# Patient Record
Sex: Female | Born: 1985
Health system: Southern US, Community
[De-identification: ages and names within clinical notes are randomized; demographics above are authoritative.]

## PROBLEM LIST (undated history)

## (undated) ENCOUNTER — Inpatient Hospital Stay (HOSPITAL_COMMUNITY): Payer: Self-pay

## (undated) DIAGNOSIS — M3500 Sicca syndrome, unspecified: Secondary | ICD-10-CM

## (undated) DIAGNOSIS — R112 Nausea with vomiting, unspecified: Secondary | ICD-10-CM

## (undated) DIAGNOSIS — M2550 Pain in unspecified joint: Secondary | ICD-10-CM

## (undated) DIAGNOSIS — G43909 Migraine, unspecified, not intractable, without status migrainosus: Secondary | ICD-10-CM

## (undated) DIAGNOSIS — F411 Generalized anxiety disorder: Secondary | ICD-10-CM

## (undated) DIAGNOSIS — F419 Anxiety disorder, unspecified: Secondary | ICD-10-CM

## (undated) DIAGNOSIS — Z8489 Family history of other specified conditions: Secondary | ICD-10-CM

## (undated) DIAGNOSIS — Z9889 Other specified postprocedural states: Secondary | ICD-10-CM

## (undated) DIAGNOSIS — F909 Attention-deficit hyperactivity disorder, unspecified type: Secondary | ICD-10-CM

## (undated) DIAGNOSIS — T41205D Adverse effect of unspecified general anesthetics, subsequent encounter: Secondary | ICD-10-CM

## (undated) DIAGNOSIS — K219 Gastro-esophageal reflux disease without esophagitis: Secondary | ICD-10-CM

## (undated) HISTORY — DX: Anxiety disorder, unspecified: F41.9

## (undated) HISTORY — DX: Migraine, unspecified, not intractable, without status migrainosus: G43.909

## (undated) HISTORY — DX: Attention-deficit hyperactivity disorder, unspecified type: F90.9

## (undated) HISTORY — DX: Sjogren syndrome, unspecified: M35.00

---
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
## 2005-09-13 ENCOUNTER — Emergency Department (HOSPITAL_COMMUNITY): Admission: EM | Admit: 2005-09-13 | Discharge: 2005-09-13 | Payer: Self-pay | Admitting: Emergency Medicine

## 2005-09-30 ENCOUNTER — Emergency Department (HOSPITAL_COMMUNITY): Admission: EM | Admit: 2005-09-30 | Discharge: 2005-09-30 | Payer: Self-pay | Admitting: Emergency Medicine

## 2006-02-04 HISTORY — PX: WISDOM TOOTH EXTRACTION: SHX21

## 2011-01-31 ENCOUNTER — Ambulatory Visit (INDEPENDENT_AMBULATORY_CARE_PROVIDER_SITE_OTHER): Payer: Commercial Managed Care - PPO

## 2011-01-31 DIAGNOSIS — J019 Acute sinusitis, unspecified: Secondary | ICD-10-CM

## 2011-01-31 DIAGNOSIS — R12 Heartburn: Secondary | ICD-10-CM

## 2011-02-05 HISTORY — PX: BREAST ENHANCEMENT SURGERY: SHX7

## 2011-02-07 ENCOUNTER — Ambulatory Visit (INDEPENDENT_AMBULATORY_CARE_PROVIDER_SITE_OTHER): Payer: Commercial Managed Care - PPO

## 2011-02-07 DIAGNOSIS — F411 Generalized anxiety disorder: Secondary | ICD-10-CM

## 2011-02-07 DIAGNOSIS — F909 Attention-deficit hyperactivity disorder, unspecified type: Secondary | ICD-10-CM

## 2011-02-07 DIAGNOSIS — S9030XA Contusion of unspecified foot, initial encounter: Secondary | ICD-10-CM

## 2011-02-07 DIAGNOSIS — M79609 Pain in unspecified limb: Secondary | ICD-10-CM

## 2011-03-11 ENCOUNTER — Ambulatory Visit (INDEPENDENT_AMBULATORY_CARE_PROVIDER_SITE_OTHER): Payer: 59 | Admitting: Physician Assistant

## 2011-03-11 VITALS — BP 112/66 | HR 84 | Temp 98.3°F | Resp 16

## 2011-03-11 DIAGNOSIS — M3500 Sicca syndrome, unspecified: Secondary | ICD-10-CM

## 2011-03-11 DIAGNOSIS — R4589 Other symptoms and signs involving emotional state: Secondary | ICD-10-CM

## 2011-03-11 DIAGNOSIS — R112 Nausea with vomiting, unspecified: Secondary | ICD-10-CM

## 2011-03-11 DIAGNOSIS — G43909 Migraine, unspecified, not intractable, without status migrainosus: Secondary | ICD-10-CM

## 2011-03-11 DIAGNOSIS — F419 Anxiety disorder, unspecified: Secondary | ICD-10-CM

## 2011-03-11 DIAGNOSIS — G44209 Tension-type headache, unspecified, not intractable: Secondary | ICD-10-CM

## 2011-03-11 DIAGNOSIS — Z566 Other physical and mental strain related to work: Secondary | ICD-10-CM

## 2011-03-11 MED ORDER — ALPRAZOLAM 0.25 MG PO TABS
0.2500 mg | ORAL_TABLET | Freq: Once | ORAL | Status: AC
  Administered 2011-03-11: 0.25 mg via ORAL

## 2011-03-11 MED ORDER — ONDANSETRON 4 MG PO TBDP
8.0000 mg | ORAL_TABLET | Freq: Once | ORAL | Status: AC
  Administered 2011-03-11: 8 mg via ORAL

## 2011-03-11 MED ORDER — KETOROLAC TROMETHAMINE 60 MG/2ML IM SOLN
60.0000 mg | Freq: Once | INTRAMUSCULAR | Status: AC
  Administered 2011-03-11: 60 mg via INTRAMUSCULAR

## 2011-03-11 NOTE — Progress Notes (Signed)
Subjective:    Patient ID: Teresa Holden, female    DOB: 12-28-1985, 26 y.o.   MRN: [id number]  Primary Physician: No primary provider on file.  Chief Complaint: Headache for 6 days, increased stress at work, and nausea/vomting x 1.  HPI 26 y.o. y/o Caucasian female with history noted below presents with complaint of on and off headache for 6 days now, increased stress, and nausea/emesis. 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 Headache decreases sometimes while at home, then returns upon arrival to work. Headache located along occipital region and down into the neck. Has tried Motrin with some help although headache returns when patient shows up to work. No vision changes, weakness, or focal deficits.   She also reports nausea and emesis x 1 this morning. Still with mild nausea. No further emesis. Has been able to keep drink down. LMP 03/09/11. Using NuvaRing for birth control.  No further right foot pain. Back to wearing good supportive tennis shoes.    Past Medical History  Diagnosis Date  . Sjogren's syndrome   . Migraines   . Anxiety   . ADD (attention deficit disorder with hyperactivity)     Prior to Admission medications   Medication Sig Start Date End Date Taking? Authorizing Provider  ALPRAZolam Prudy Feeler) 0.5 MG tablet Take 0.5 mg by mouth as needed.   Yes Historical Provider, MD  amphetamine-dextroamphetamine (ADDERALL) 30 MG tablet Take 30 mg by mouth daily.   Yes Historical Provider, MD  etonogestrel-ethinyl estradiol (NUVARING) 0.12-0.015 MG/24HR vaginal ring Place 1 each vaginally every 28 (twenty-eight) days. Insert vaginally and leave in place for 3 consecutive weeks, then remove for 1 week.   Yes Historical Provider, MD    Allergies  Allergen Reactions  . Penicillins Rash    History   Social History  . Marital Status: Single    Spouse Name: N/A    Number of Children: N/A  . Years of Education:  N/A   Social History Main Topics  . Smoking status: Never Smoker   . Smokeless tobacco: None  . Alcohol Use: None  . Drug Use: None  . Sexually Active: None   Other Topics Concern  . None   Social History Narrative  . None    Family History  Problem Relation Age of Onset  . Sarcoidosis Mother         Review of Systems  Constitutional: Positive for fatigue. Negative for fever, chills, diaphoresis, activity change and appetite change.  HENT: Negative for hearing loss, congestion, rhinorrhea, neck pain, neck stiffness and tinnitus.   Gastrointestinal: Positive for nausea and vomiting.  Neurological: Positive for headaches. Negative for dizziness, tremors, seizures, syncope, facial asymmetry, speech difficulty, weakness, light-headedness and numbness.       Objective:   Physical Exam  Constitutional: She is oriented to person, place, and time. She appears well-developed and well-nourished. No distress.  HENT:  Head: Normocephalic and atraumatic.  Right Ear: Hearing, tympanic membrane, external ear and ear canal normal.  Left Ear: Hearing, tympanic membrane, external ear and ear canal normal.  Nose: Nose normal.  Mouth/Throat: Oropharynx is clear and moist. No oropharyngeal exudate.  Eyes: Conjunctivae and EOM are normal. Pupils are equal, round, and reactive to light. Right eye exhibits no discharge. Left eye exhibits no discharge. No scleral icterus.  Neck: Normal range of motion. Neck supple.  Cardiovascular: Normal rate, regular rhythm and normal heart sounds.  Exam reveals no gallop and no  friction rub.   No murmur heard. Pulmonary/Chest: Effort normal and breath sounds normal. No respiratory distress. She has no wheezes. She has no rales. She exhibits no tenderness.  Neurological: She is alert and oriented to person, place, and time. She has normal reflexes. She displays no tremor and normal reflexes. No cranial nerve deficit or sensory deficit. She exhibits normal  muscle tone. Coordination normal.  Skin: Skin is warm and dry. No rash noted. She is not diaphoretic.  Psychiatric: She has a normal mood and affect. Her behavior is normal. Judgment and thought content normal.          Assessment & Plan:  26 y.o. Caucasian female with tension type headache, acute stress reaction, nausea, and resolved foot pain. 1. Tension Headache - Declines CT head today. Advised if persists to call.  - Toradol 60 mg IM  2. Acute stress reaction - Xanax 0.25 mg in office - Has Xanax 0.5 mg at home if needed - Take time for herself - If worsens please call  3. Nausea - Zofran 4 mg ODT x 2 given in office - Emesis resolved - Push fluids - Advance diet as tolerated  4. Right foot pain - Resolved  5. RTC/ER precautions  Signed,  Eula Listen, PA-C 03/11/2011, 11:40 AM

## 2011-03-11 NOTE — Patient Instructions (Addendum)

## 2011-03-13 ENCOUNTER — Telehealth: Payer: Self-pay

## 2011-03-13 DIAGNOSIS — R519 Headache, unspecified: Secondary | ICD-10-CM

## 2011-03-13 NOTE — Telephone Encounter (Signed)
Mssg for Teresa Holden's: Laurieanne saw you for a migraine on 03/11/11 and it was discussed that if it continued we would schedule her for a CT scan.  She would like for [place] to go ahead and schedule the CT scan. She is currently having another migraine.

## 2011-03-14 ENCOUNTER — Other Ambulatory Visit: Payer: Self-pay | Admitting: Physician Assistant

## 2011-03-14 ENCOUNTER — Ambulatory Visit (HOSPITAL_BASED_OUTPATIENT_CLINIC_OR_DEPARTMENT_OTHER)
Admission: RE | Admit: 2011-03-14 | Discharge: 2011-03-14 | Disposition: A | Discharge status: Home / Self Care | Payer: 59 | Source: Ambulatory Visit | Attending: Physician Assistant | Admitting: Physician Assistant

## 2011-03-14 DIAGNOSIS — R51 Headache: Secondary | ICD-10-CM | POA: Insufficient documentation

## 2011-03-14 DIAGNOSIS — R519 Headache, unspecified: Secondary | ICD-10-CM

## 2011-03-14 DIAGNOSIS — G43919 Migraine, unspecified, intractable, without status migrainosus: Secondary | ICD-10-CM

## 2011-03-14 MED ORDER — TOPIRAMATE 25 MG PO TABS: ORAL_TABLET | ORAL | Status: DC

## 2011-03-14 NOTE — Telephone Encounter (Signed)
Patient was seen on 2/4 for migraine. At that time she declined a CT scan. I was sent a message this AM that she would like to proceed with the CT scan. Will set this up for this AM with a call report.  Eula Listen, PA-C 03/14/2011 8:11 AM

## 2011-03-14 NOTE — Progress Notes (Signed)
See patient phone call. Patient has had intractable headache for the past 8-9 days. Requested CT scan of her head today. Report of CT normal, without acute intracranial process. Will proceed with starting Topamax, which patient has previously taken with good results. See Rx for titration specifics. Advised patient if headache persists to notify me and we will [facility].  Currently patient states she does have a mild headache.  Discussed with Dr. Cleta Alberts.  Eula Listen, PA-C 03/14/2011 3:44 PM

## 2011-04-03 ENCOUNTER — Telehealth: Payer: Self-pay

## 2011-04-03 NOTE — Telephone Encounter (Signed)
Pt requests refill on adderall and xanax

## 2011-04-04 NOTE — Telephone Encounter (Signed)
Please put her chart in my box.  Thanks,  [employer]

## 2011-04-04 NOTE — Telephone Encounter (Signed)
Pulled chart. In your box

## 2011-04-05 MED ORDER — AMPHETAMINE-DEXTROAMPHETAMINE 30 MG PO TABS: 30.0000 mg | ORAL_TABLET | Freq: Every day | ORAL | Status: DC

## 2011-04-05 MED ORDER — ALPRAZOLAM 0.5 MG PO TABS: 0.5000 mg | ORAL_TABLET | Freq: Three times a day (TID) | ORAL | Status: DC | PRN

## 2011-04-05 NOTE — Telephone Encounter (Signed)
LMOM on VM at 104 letting her know Rx is ready

## 2011-04-05 NOTE — Telephone Encounter (Signed)
Please contact patient. Rx's ready.  Teresa Holden

## 2011-04-12 ENCOUNTER — Ambulatory Visit (INDEPENDENT_AMBULATORY_CARE_PROVIDER_SITE_OTHER): Payer: 59 | Admitting: Physician Assistant

## 2011-04-12 VITALS — BP 128/86 | HR 96 | Temp 98.1°F | Resp 18 | Ht 63.0 in | Wt 137.0 lb

## 2011-04-12 DIAGNOSIS — J40 Bronchitis, not specified as acute or chronic: Secondary | ICD-10-CM

## 2011-04-12 DIAGNOSIS — R05 Cough: Secondary | ICD-10-CM

## 2011-04-12 DIAGNOSIS — R059 Cough, unspecified: Secondary | ICD-10-CM

## 2011-04-12 DIAGNOSIS — J9801 Acute bronchospasm: Secondary | ICD-10-CM

## 2011-04-12 MED ORDER — AZITHROMYCIN 250 MG PO TABS: ORAL_TABLET | ORAL | Status: AC

## 2011-04-12 MED ORDER — ALBUTEROL SULFATE HFA 108 (90 BASE) MCG/ACT IN AERS: 2.0000 | INHALATION_SPRAY | RESPIRATORY_TRACT | Status: DC | PRN

## 2011-04-12 MED ORDER — ALBUTEROL SULFATE (2.5 MG/3ML) 0.083% IN NEBU
2.5000 mg | INHALATION_SOLUTION | Freq: Once | RESPIRATORY_TRACT | Status: AC
  Administered 2011-04-12: 2.5 mg via RESPIRATORY_TRACT

## 2011-04-12 MED ORDER — HYDROCODONE-HOMATROPINE 5-1.5 MG/5ML PO SYRP: ORAL_SOLUTION | ORAL | Status: AC

## 2011-04-12 MED ORDER — BENZONATATE 200 MG PO CAPS: 200.0000 mg | ORAL_CAPSULE | Freq: Three times a day (TID) | ORAL | Status: AC | PRN

## 2011-04-12 NOTE — Progress Notes (Signed)
Patient ID: Teresa Holden MRN: [id number], DOB: 1985-11-27, 26 y.o. Date of Encounter: 04/12/2011, 2:03 PM  Primary Physician: No primary provider on file.  Chief Complaint:  Chief Complaint  Patient presents with  . Cough    greenish x 3 wks  . Sore Throat    x3 wks    HPI: 26 y.o. year old female presents with 21 day history of nasal congestion, post nasal drip, sore throat, sinus pressure, and cough. Afebrile. No chills. Nasal congestion thick and green/yellow. Cough is productive of green/yellow sputum and not associated with time of day. Mild wheezing at night. No SOB. Ears feel full, leading to sensation of muffled hearing. Has tried OTC cold preps without success. No GI complaints. Appetite normal. Sick contacts at work. No recent antibiotics or recent travels.   No leg trauma, sedentary periods, h/o cancer, or tobacco use.  Past Medical History  Diagnosis Date  . Sjogren's syndrome   . Migraines   . Anxiety   . ADD (attention deficit disorder with hyperactivity)      Home Meds: Prior to Admission medications   Medication Sig Start Date End Date Taking? Authorizing Provider  ALPRAZolam Prudy Feeler) 0.5 MG tablet Take 1 tablet (0.5 mg total) by mouth 3 (three) times daily as needed. 04/05/11  Yes Ryker Pherigo M Cortland Crehan, PA-C  amphetamine-dextroamphetamine (ADDERALL) 30 MG tablet Take 1 tablet (30 mg total) by mouth daily. 04/05/11  Yes Leighann Amadon Adria Devon, PA-C  etonogestrel-ethinyl estradiol (NUVARING) 0.12-0.015 MG/24HR vaginal ring Place 1 each vaginally every 28 (twenty-eight) days. Insert vaginally and leave in place for 3 consecutive weeks, then remove for 1 week.   Yes Historical Provider, MD  topiramate (TOPAMAX) 25 MG tablet 1 tab po qhs for one week, then increase to 2 tabs po qhs for one week, then 3 tabs po qhs for one week, then 4 tabs po qhs for one week. 03/14/11  Yes Veryl Winemiller M Guage Efferson, PA-C  albuterol (PROVENTIL HFA;VENTOLIN HFA) 108 (90 BASE) MCG/ACT inhaler Inhale 2 puffs into the lungs  every 4 (four) hours as needed for wheezing. 04/12/11 04/11/12  Raymon Mutton Brizza Nathanson, PA-C  azithromycin (ZITHROMAX Z-PAK) 250 MG tablet 2 tabs po first day, then 1 tab po next 4 days 04/12/11 04/17/11  Raymon Mutton Nelda Luckey, PA-C  benzonatate (TESSALON) 200 MG capsule Take 1 capsule (200 mg total) by mouth 3 (three) times daily as needed for cough. 04/12/11 04/19/11  Zyrus Hetland M Keishia Ground, PA-C  HYDROcodone-homatropine (HYCODAN) 5-1.5 MG/5ML syrup 1 TSP PO Q 4-6 HOURS PRN COUGH 04/12/11 04/22/11  Sondra Barges, PA-C    Allergies:  Allergies  Allergen Reactions  . Penicillins Rash    History   Social History  . Marital Status: Married    Spouse Name: N/A    Number of Children: N/A  . Years of Education: N/A   Occupational History  . Not on file.   Social History Main Topics  . Smoking status: Never Smoker   . Smokeless tobacco: Not on file  . Alcohol Use: Not on file  . Drug Use: Not on file  . Sexually Active: Not on file   Other Topics Concern  . Not on file   Social History Narrative  . No narrative on file     Review of Systems: Constitutional: negative for chills, fever, night sweats or weight changes Cardiovascular: negative for chest pain or palpitations Respiratory: negative for hemoptysis, dyspnea, or shortness of breath Abdominal: negative for abdominal pain, nausea, vomiting or diarrhea  Dermatological: negative for rash Neurologic: negative for headache   Physical Exam: Blood pressure 128/86, pulse 96, temperature 98.1 F (36.7 C), temperature source Oral, resp. rate 18, height 5\' 3"  (1.6 m), weight 137 lb (62.143 kg), last menstrual period 04/07/2011., Body mass index is 24.27 kg/(m^2). General: Well developed, well nourished, in no acute distress. Head: Normocephalic, atraumatic, eyes without discharge, sclera non-icteric, nares are congested. Bilateral auditory canals clear, TM's are without perforation, pearly grey with reflective cone of light bilaterally. Serous effusion bilaterally behind  TM's. No sinus TTP. Oral cavity moist, dentition normal. Posterior pharynx with post nasal drip and mild erythema. No peritonsillar abscess or tonsillar exudate. Neck: Supple. No thyromegaly. Full ROM. No lymphadenopathy. Lungs: Clear bilaterally to auscultation with mild expiratory wheezes. No rales or rhonchi. Breathing is unlabored.  Heart: RRR with S1 S2. No murmurs, rubs, or gallops appreciated. Msk:  Strength and tone normal for age. Extremities: No clubbing or cyanosis. No edema. Neuro: Alert and oriented X 3. Moves all extremities spontaneously. CNII-XII grossly in tact. Psych:  Responds to questions appropriately with a normal affect.   S/P: Albuterol Neb, patient feels considerably better. No further wheezing on auscultation.  ASSESSMENT AND PLAN:  26 y.o. year old female with bronchitis and bronchospasm. -Azithromycin 250 MG #6 2 po first day then 1 po next 4 days no RF -Hycodan #4oz 1 tsp po q 4-6 hours prn cough no RF SED -Tessalon Perles 100 mg 1 po tid prn cough #30 no RF  -Proventil 2 puffs inhaled q 4-6 hours prn #1 no RF -Mucinex -Tylenol/Motrin prn -Rest/fluids -RTC precautions -RTC 3-5 days if no improvement  Signed, Eula Listen, PA-C 04/12/2011 2:03 PM

## 2011-05-13 ENCOUNTER — Telehealth: Payer: Self-pay

## 2011-05-13 MED ORDER — ALPRAZOLAM 0.5 MG PO TABS: 0.5000 mg | ORAL_TABLET | Freq: Three times a day (TID) | ORAL | Status: DC | PRN

## 2011-05-13 MED ORDER — AMPHETAMINE-DEXTROAMPHETAMINE 30 MG PO TABS: 30.0000 mg | ORAL_TABLET | Freq: Every day | ORAL | Status: DC

## 2011-05-13 NOTE — Telephone Encounter (Signed)
At Tl desk 

## 2011-05-13 NOTE — Telephone Encounter (Signed)
Patient needs refills of Adderall 30 mg and Xanax .5 mg.  Please call when ready to pick up.

## 2011-05-13 NOTE — Telephone Encounter (Signed)
Notified pt that Rxs ready for p/up

## 2011-05-13 NOTE — Telephone Encounter (Signed)
Xanax at [employer] [facility].

## 2011-05-13 NOTE — Telephone Encounter (Signed)
Please see request for xanax also

## 2011-06-24 ENCOUNTER — Telehealth: Payer: Self-pay

## 2011-06-24 MED ORDER — AMPHETAMINE-DEXTROAMPHETAMINE 30 MG PO TABS: 30.0000 mg | ORAL_TABLET | Freq: Every day | ORAL | Status: DC

## 2011-06-24 MED ORDER — CLONAZEPAM 0.5 MG PO TABS: 0.5000 mg | ORAL_TABLET | Freq: Two times a day (BID) | ORAL | Status: DC | PRN

## 2011-06-24 NOTE — Telephone Encounter (Signed)
Refill done, change to klonopin made.

## 2011-06-24 NOTE — Telephone Encounter (Signed)
Pt.notified

## 2011-06-24 NOTE — Telephone Encounter (Signed)
Teresa Holden  PT REQUESTS ADDERALL RX REFILL  WOULD LIKE TO SEE IF XANAX CAN BE CHANGED TO KLONOPIN AS DISCUSED IN PREVIOUS OFFICE VISIT. XANAX SEEMS TO NOT BE WORKING AT NIGHT AS WELL AS IT USED TO.   PLEASE ADVISE WHEN READY FOR PICK UP

## 2011-08-04 ENCOUNTER — Ambulatory Visit (INDEPENDENT_AMBULATORY_CARE_PROVIDER_SITE_OTHER): Payer: 59 | Admitting: Emergency Medicine

## 2011-08-04 VITALS — BP 113/77 | HR 91 | Temp 98.2°F | Resp 18 | Ht 63.0 in | Wt 130.2 lb

## 2011-08-04 DIAGNOSIS — J029 Acute pharyngitis, unspecified: Secondary | ICD-10-CM

## 2011-08-04 DIAGNOSIS — J40 Bronchitis, not specified as acute or chronic: Secondary | ICD-10-CM

## 2011-08-04 DIAGNOSIS — F988 Other specified behavioral and emotional disorders with onset usually occurring in childhood and adolescence: Secondary | ICD-10-CM

## 2011-08-04 DIAGNOSIS — J018 Other acute sinusitis: Secondary | ICD-10-CM

## 2011-08-04 MED ORDER — AMPHETAMINE-DEXTROAMPHETAMINE 30 MG PO TABS: 30.0000 mg | ORAL_TABLET | Freq: Every day | ORAL | Status: DC

## 2011-08-04 MED ORDER — PSEUDOEPHEDRINE-GUAIFENESIN ER 60-600 MG PO TB12: 1.0000 | ORAL_TABLET | Freq: Two times a day (BID) | ORAL | Status: DC

## 2011-08-04 MED ORDER — LEVOFLOXACIN 500 MG PO TABS: 500.0000 mg | ORAL_TABLET | Freq: Every day | ORAL | Status: DC

## 2011-08-04 MED ORDER — HYDROCOD POLST-CHLORPHEN POLST 10-8 MG/5ML PO LQCR: 5.0000 mL | Freq: Two times a day (BID) | ORAL | Status: DC | PRN

## 2011-08-04 NOTE — Progress Notes (Signed)
  Subjective:    Patient ID: Teresa Holden, female    DOB: 1985/07/04, 26 y.o.   MRN: [id number]  Cough This is a new problem. The problem occurs every few minutes. The cough is productive of purulent sputum. Associated symptoms include nasal congestion, postnasal drip and a sore throat. Pertinent negatives include no chest pain, chills, ear congestion, ear pain, fever, headaches, heartburn, hemoptysis, myalgias, rash, rhinorrhea, shortness of breath, sweats, weight loss or wheezing. The symptoms are aggravated by lying down. She has tried OTC cough suppressant and a beta-agonist inhaler for the symptoms. The treatment provided no relief. There is no history of asthma, bronchiectasis, bronchitis, COPD, emphysema, environmental allergies or pneumonia.  Sore Throat  This is a new problem. The current episode started in the past 7 days. The problem has been gradually worsening. Neither side of throat is experiencing more pain than the other. The maximum temperature recorded prior to her arrival was 100 - 100.9 F. The pain is at a severity of 3/10. The pain is mild. Associated symptoms include congestion, coughing and a hoarse voice. Pertinent negatives include no abdominal pain, diarrhea, drooling, ear discharge, ear pain, headaches, plugged ear sensation, neck pain, shortness of breath, stridor, swollen glands, trouble swallowing or vomiting. She has tried acetaminophen for the symptoms. The treatment provided mild relief.      Review of Systems  Constitutional: Negative for fever, chills and weight loss.  HENT: Positive for congestion, sore throat, hoarse voice and postnasal drip. Negative for ear pain, rhinorrhea, drooling, trouble swallowing, neck pain and ear discharge.   Eyes: Negative.   Respiratory: Positive for cough. Negative for hemoptysis, shortness of breath, wheezing and stridor.   Cardiovascular: Negative for chest pain.  Gastrointestinal: Negative.  Negative for heartburn, vomiting,  abdominal pain and diarrhea.  Genitourinary: Negative.   Musculoskeletal: Negative.  Negative for myalgias.  Skin: Negative for rash.  Neurological: Negative.  Negative for headaches.  Hematological: Negative for environmental allergies.       Objective:   Physical Exam  Constitutional: She is oriented to person, place, and time. She appears well-developed and well-nourished.  HENT:  Head: Normocephalic and atraumatic.  Nose: Mucosal edema present. Right sinus exhibits maxillary sinus tenderness. Left sinus exhibits maxillary sinus tenderness.  Mouth/Throat: Oropharynx is clear and moist.  Eyes: Pupils are equal, round, and reactive to light.  Neck: Normal range of motion. Neck supple.  Cardiovascular: Normal rate, regular rhythm and normal heart sounds.   Pulmonary/Chest: Effort normal.  Abdominal: Soft.  Musculoskeletal: Normal range of motion.  Neurological: She is alert and oriented to person, place, and time.  Skin: Skin is warm and dry.          Assessment & Plan:  Maxillary sinusitis Bronchitis Pharyngitis levaquin mucinex d tussionex

## 2011-08-12 ENCOUNTER — Telehealth: Payer: Self-pay

## 2011-08-12 DIAGNOSIS — J018 Other acute sinusitis: Secondary | ICD-10-CM

## 2011-08-12 MED ORDER — LEVOFLOXACIN 500 MG PO TABS: 500.0000 mg | ORAL_TABLET | Freq: Every day | ORAL | Status: AC

## 2011-08-12 NOTE — Telephone Encounter (Signed)
Patient would like for [place] to call in another rx for levaquin.  She lost hers on vacation.  Can this be done?  Still having throat issues.

## 2011-08-12 NOTE — Telephone Encounter (Signed)
PT LOST LEVAQUIN RX ON VACATION AND WOULD LIKE ANOTHER ONE CALLED IN. PT USES [facility] [phone]

## 2011-08-12 NOTE — Telephone Encounter (Signed)
How many days did she miss or take? The Levaquin was for sinusitis mainly.  What are her symptoms specifically?

## 2011-08-12 NOTE — Addendum Note (Signed)
Addended by: Jacqualyn Posey on: 08/12/2011 06:19 PM   Modules accepted: Orders

## 2011-08-12 NOTE — Telephone Encounter (Signed)
Spoke with patient-- took 5 days of Levaquin, then lost rx of remaining 5 days.  Still having cough and chest congestion.  Ok per [place] to rx remainder.

## 2011-08-13 NOTE — Telephone Encounter (Signed)
Notified pt Rx was sent to pharmacy.

## 2011-08-28 ENCOUNTER — Other Ambulatory Visit (INDEPENDENT_AMBULATORY_CARE_PROVIDER_SITE_OTHER): Payer: 59 | Admitting: Physician Assistant

## 2011-08-28 ENCOUNTER — Other Ambulatory Visit: Payer: Self-pay | Admitting: Physician Assistant

## 2011-08-28 DIAGNOSIS — Z419 Encounter for procedure for purposes other than remedying health state, unspecified: Secondary | ICD-10-CM

## 2011-08-28 LAB — CBC WITH DIFFERENTIAL/PLATELET
Basophils Absolute: 0 10*3/uL (ref 0.0–0.1)
Basophils Relative: 0 % (ref 0–1)
Eosinophils Absolute: 0.1 10*3/uL (ref 0.0–0.7)
Eosinophils Relative: 2 % (ref 0–5)
HCT: 38.8 % (ref 36.0–46.0)
Hemoglobin: 12.9 g/dL (ref 12.0–15.0)
Lymphocytes Relative: 33 % (ref 12–46)
Lymphs Abs: 1.5 10*3/uL (ref 0.7–4.0)
MCH: 27 pg (ref 26.0–34.0)
MCHC: 33.2 g/dL (ref 30.0–36.0)
MCV: 81.2 fL (ref 78.0–100.0)
Monocytes Absolute: 0.5 10*3/uL (ref 0.1–1.0)
Monocytes Relative: 10 % (ref 3–12)
Neutro Abs: 2.6 10*3/uL (ref 1.7–7.7)
Neutrophils Relative %: 55 % (ref 43–77)
Platelets: 267 10*3/uL (ref 150–400)
RBC: 4.78 MIL/uL (ref 3.87–5.11)
RDW: 14.7 % (ref 11.5–15.5)
WBC: 4.6 10*3/uL (ref 4.0–10.5)

## 2011-08-28 LAB — BASIC METABOLIC PANEL
BUN: 9 mg/dL (ref 6–23)
CO2: 26 mEq/L (ref 19–32)
Calcium: 9.3 mg/dL (ref 8.4–10.5)
Chloride: 105 mEq/L (ref 96–112)
Creat: 0.8 mg/dL (ref 0.50–1.10)
Glucose, Bld: 86 mg/dL (ref 70–99)
Potassium: 4.1 mEq/L (ref 3.5–5.3)
Sodium: 140 mEq/L (ref 135–145)

## 2011-08-28 MED ORDER — ALPRAZOLAM 0.5 MG PO TABS: 0.5000 mg | ORAL_TABLET | Freq: Three times a day (TID) | ORAL | Status: DC | PRN

## 2011-08-28 NOTE — Progress Notes (Signed)
  Subjective:    Patient ID: Teresa Holden, female    DOB: Jun 12, 1985, 26 y.o.   MRN: [id number]  HPI Needs labs for elective surgery procedure.   Review of Systems Not done     Objective:   Physical Exam Not done        Assessment & Plan:  Check BMET and CBC per Plastic Surgeon's request.

## 2011-09-18 ENCOUNTER — Telehealth: Payer: Self-pay

## 2011-09-18 ENCOUNTER — Other Ambulatory Visit: Payer: Self-pay

## 2011-09-18 MED ORDER — FLUCONAZOLE 150 MG PO TABS: 150.0000 mg | ORAL_TABLET | Freq: Once | ORAL | Status: AC

## 2011-09-18 MED ORDER — FLUCONAZOLE 150 MG PO TABS: 150.0000 mg | ORAL_TABLET | Freq: Once | ORAL | Status: DC

## 2011-09-18 NOTE — Telephone Encounter (Signed)
Pt called and states she has a yeast infection due to the antibiotic. Sent in RX to [facility] per Benny Lennert

## 2011-10-16 ENCOUNTER — Ambulatory Visit (INDEPENDENT_AMBULATORY_CARE_PROVIDER_SITE_OTHER): Payer: 59 | Admitting: Physician Assistant

## 2011-10-16 VITALS — BP 104/76 | HR 91 | Temp 98.0°F | Resp 16 | Ht 64.0 in | Wt 131.0 lb

## 2011-10-16 DIAGNOSIS — F411 Generalized anxiety disorder: Secondary | ICD-10-CM

## 2011-10-16 DIAGNOSIS — F418 Other specified anxiety disorders: Secondary | ICD-10-CM

## 2011-10-16 MED ORDER — ALPRAZOLAM 0.5 MG PO TABS: ORAL_TABLET | ORAL | Status: DC

## 2011-10-16 MED ORDER — ESCITALOPRAM OXALATE 10 MG PO TABS: 10.0000 mg | ORAL_TABLET | Freq: Every day | ORAL | Status: DC

## 2011-10-16 NOTE — Progress Notes (Signed)
  Subjective:    Patient ID: Teresa Holden, female    DOB: July 25, 1985, 26 y.o.   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  She had a recent surgery.  Increased responsibility at work.  Dealing with child's father about child-support issues and him not following the terms of their previous agreements. She currently takes 1-2 0.5mg  xanax hs.  She was taking lexapro several years ago and seemed to do well on it. She hasn't needed it since then.  Denies SI/HI.  She has a good support system at home.    Review of Systems  All other systems reviewed and are negative.       Objective:   Physical Exam  Nursing note and vitals reviewed. Constitutional: She is oriented to person, place, and time. She appears well-developed and well-nourished.       Tearful at times  HENT:  Head: Normocephalic and atraumatic.  Cardiovascular: Normal rate, regular rhythm and normal heart sounds.   Pulmonary/Chest: Effort normal and breath sounds normal.  Neurological: She is alert and oriented to person, place, and time.  Skin: Skin is warm.  Psychiatric: She has a normal mood and affect. Her behavior is normal.      Assessment & Plan:  Situational anxiety-restart lexapro. Ok to use xanax 1/2-1 prn up to bid.  She will use this as needed but sparingly as the lexapro takes effect. We will discuss again in a few weeks, sooner if needed.

## 2011-12-03 ENCOUNTER — Ambulatory Visit (INDEPENDENT_AMBULATORY_CARE_PROVIDER_SITE_OTHER): Payer: 59 | Admitting: Physician Assistant

## 2011-12-03 VITALS — BP 102/78 | HR 90 | Temp 98.3°F | Resp 16 | Ht 64.75 in | Wt 136.4 lb

## 2011-12-03 DIAGNOSIS — T148XXA Other injury of unspecified body region, initial encounter: Secondary | ICD-10-CM

## 2011-12-03 DIAGNOSIS — M791 Myalgia, unspecified site: Secondary | ICD-10-CM

## 2011-12-03 DIAGNOSIS — IMO0001 Reserved for inherently not codable concepts without codable children: Secondary | ICD-10-CM

## 2011-12-03 LAB — PROTIME-INR
INR: 0.99 (ref <1.50)
Prothrombin Time: 13.1 seconds (ref 11.6–15.2)

## 2011-12-03 LAB — POCT CBC
Granulocyte percent: 68.3 %G (ref 37–80)
HCT, POC: 42.8 % (ref 37.7–47.9)
Hemoglobin: 13.5 g/dL (ref 12.2–16.2)
Lymph, poc: 2.1 (ref 0.6–3.4)
MCH, POC: 27.1 pg (ref 27–31.2)
MCHC: 31.5 g/dL — AB (ref 31.8–35.4)
MCV: 85.7 fL (ref 80–97)
MID (cbc): 0.5 (ref 0–0.9)
MPV: 9.4 fL (ref 0–99.8)
POC Granulocyte: 5.6 (ref 2–6.9)
POC LYMPH PERCENT: 26.1 %L (ref 10–50)
POC MID %: 5.6 %M (ref 0–12)
Platelet Count, POC: 285 10*3/uL (ref 142–424)
RBC: 4.99 M/uL (ref 4.04–5.48)
RDW, POC: 14.2 %
WBC: 8.2 10*3/uL (ref 4.6–10.2)

## 2011-12-03 LAB — COMPREHENSIVE METABOLIC PANEL
ALT: 13 U/L (ref 0–35)
AST: 16 U/L (ref 0–37)
Albumin: 4.3 g/dL (ref 3.5–5.2)
Alkaline Phosphatase: 48 U/L (ref 39–117)
BUN: 9 mg/dL (ref 6–23)
CO2: 25 mEq/L (ref 19–32)
Calcium: 9.2 mg/dL (ref 8.4–10.5)
Chloride: 101 mEq/L (ref 96–112)
Creat: 0.79 mg/dL (ref 0.50–1.10)
Glucose, Bld: 74 mg/dL (ref 70–99)
Potassium: 4.2 mEq/L (ref 3.5–5.3)
Sodium: 136 mEq/L (ref 135–145)
Total Bilirubin: 0.6 mg/dL (ref 0.3–1.2)
Total Protein: 6.6 g/dL (ref 6.0–8.3)

## 2011-12-03 MED ORDER — PREDNISONE 20 MG PO TABS: ORAL_TABLET | ORAL | Status: DC

## 2011-12-03 MED ORDER — KETOROLAC TROMETHAMINE 60 MG/2ML IM SOLN
60.0000 mg | Freq: Once | INTRAMUSCULAR | Status: AC
  Administered 2011-12-03: 60 mg via INTRAMUSCULAR

## 2011-12-03 NOTE — Progress Notes (Signed)
Patient ID: Teresa Holden MRN: [id number], DOB: November 10, 1985, 26 y.o. Date of Encounter: 12/03/2011, 2:52 PM  Primary Physician: Carola Frost  Chief Complaint: Myalgias and bruising  HPI: 26 y.o. year old female with history below presents with two concerns. 1) Return of her longstanding history of myalgias. She complains of just being sore all over. Missed a day of work on 12/02/11 due to her left leg and hip hurting so much. Today it is improved, but still somewhat sore. She is sore in her usual joints: knees, shoulders, hands/wrists, and now her hips.   2) She also mentions a new history of bruising. This has been going on for the past several weeks. She was recently started on Lexapro in September for anxiety. She denies any trauma or injury. She has small discoid bruising from her flu vaccine, a bruise on each forearm, and a bruise on each inner thigh. She is in a happy marriage. Denies any spousal abuse.    Past Medical History  Diagnosis Date  . Sjogren's syndrome   . Migraines   . Anxiety   . ADD (attention deficit disorder with hyperactivity)      Home Meds: Prior to Admission medications   Medication Sig Start Date End Date Taking? Authorizing Provider  ALPRAZolam Prudy Feeler) 0.5 MG tablet 1/2-1 in am and every 6 hrs while awake.  May take up to 2 hs 10/16/11  Yes Anders Simmonds, PA-C  amphetamine-dextroamphetamine (ADDERALL) 30 MG tablet Take 1 tablet (30 mg total) by mouth daily. Do not fill before 10/04/2011 08/04/11  Yes Phillips Odor, MD  escitalopram (LEXAPRO) 10 MG tablet Take 1 tablet (10 mg total) by mouth daily. 10/16/11 01/14/12 Yes Marzella Schlein McClung, PA-C  albuterol (PROVENTIL HFA;VENTOLIN HFA) 108 (90 BASE) MCG/ACT inhaler Inhale 2 puffs into the lungs every 4 (four) hours as needed for wheezing. 04/12/11 04/11/12  Ryan M Dunn, PA-C  benzonatate (TESSALON) 100 MG capsule Take 100 mg by mouth 3 (three) times daily as needed.    Historical Provider, MD  cetirizine  (ZYRTEC) 10 MG tablet Take 10 mg by mouth daily.    Historical Provider, MD  chlorpheniramine-HYDROcodone (TUSSIONEX PENNKINETIC ER) 10-8 MG/5ML LQCR Take 5 mLs by mouth every 12 (twelve) hours as needed (cough). 08/04/11   Phillips Odor, MD  clonazePAM (KLONOPIN) 0.5 MG tablet Take 1 tablet (0.5 mg total) by mouth 2 (two) times daily as needed for anxiety. 06/24/11 07/24/11  Sondra Barges, PA-C  levonorgestrel (MIRENA) 20 MCG/24HR IUD 1 each by Intrauterine route once.    Historical Provider, MD  pseudoephedrine-guaifenesin (MUCINEX D) 60-600 MG per tablet Take 1 tablet by mouth every 12 (twelve) hours. 08/04/11 08/03/12  Phillips Odor, MD  topiramate (TOPAMAX) 25 MG tablet 1 tab po qhs for one week, then increase to 2 tabs po qhs for one week, then 3 tabs po qhs for one week, then 4 tabs po qhs for one week. 03/14/11   Sondra Barges, PA-C    Allergies:  Allergies  Allergen Reactions  . Penicillins Rash    History   Social History  . Marital Status: Married    Spouse Name: N/A    Number of Children: N/A  . Years of Education: N/A   Occupational History  . Not on file.   Social History Main Topics  . Smoking status: Never Smoker   . Smokeless tobacco: Not on file  . Alcohol Use: Not on file  . Drug Use: Not on file  .  Sexually Active: Not on file   Other Topics Concern  . Not on file   Social History Narrative  . No narrative on file     Review of Systems: Constitutional: negative for chills, fever, night sweats, weight changes, or fatigue  HEENT: negative for vision changes, hearing loss, congestion, rhinorrhea, ST, epistaxis, or sinus pressure Cardiovascular: negative for chest pain or palpitations Respiratory: negative for hemoptysis, wheezing, shortness of breath, or cough Abdominal: negative for abdominal pain, nausea, vomiting, diarrhea, or constipation Dermatological: negative for rash Musculoskeletal: see above  Neurologic: negative for headache, dizziness, or  syncope All other systems reviewed and are otherwise negative with the exception to those above and in the HPI.   Physical Exam: Blood pressure 102/78, pulse 90, temperature 98.3 F (36.8 C), temperature source Oral, resp. rate 16, height 5' 4.75" (1.645 m), weight 136 lb 6.4 oz (61.871 kg), last menstrual period 12/03/2010, SpO2 100.00%., Body mass index is 22.87 kg/(m^2). General: Well developed, well nourished, in no acute distress. Head: Normocephalic, atraumatic, eyes without discharge, sclera non-icteric, nares are without discharge. Bilateral auditory canals clear, TM's are without perforation, pearly grey and translucent with reflective cone of light bilaterally. Oral cavity moist, posterior pharynx without exudate, erythema, peritonsillar abscess, or post nasal drip.  Neck: Supple. No thyromegaly. Full ROM. No lymphadenopathy. Lungs: Clear bilaterally to auscultation without wheezes, rales, or rhonchi. Breathing is unlabored. Heart: RRR with S1 S2. No murmurs, rubs, or gallops appreciated. Msk:  Strength and tone normal for age. Extremities/Skin: Warm and dry. No clubbing or cyanosis. No edema. No rashes or suspicious lesions. FROM of all extremities. 5/5 strength in all extremities. No bony TTP. She does have several small discoid bruises at right bicep, right and left forearm, and she reports having two on the inner thigh. These are inconsistent with trauma or grasp.   Neuro: Alert and oriented X 3. Moves all extremities spontaneously. Gait is normal. CNII-XII grossly in tact. DTR 2+ in lower extremities.  Psych:  Responds to questions appropriately with a normal affect.   Labs: Results for orders placed in visit on 12/03/11  POCT CBC      Component Value Range   WBC 8.2  4.6 - 10.2 K/uL   Lymph, poc 2.1  0.6 - 3.4   POC LYMPH PERCENT 26.1  10 - 50 %L   MID (cbc) 0.5  0 - 0.9   POC MID % 5.6  0 - 12 %M   POC Granulocyte 5.6  2 - 6.9   Granulocyte percent 68.3  37 - 80 %G   RBC  4.99  4.04 - 5.48 M/uL   Hemoglobin 13.5  12.2 - 16.2 g/dL   HCT, POC 16.1  09.6 - 47.9 %   MCV 85.7  80 - 97 fL   MCH, POC 27.1  27 - 31.2 pg   MCHC 31.5 (*) 31.8 - 35.4 g/dL   RDW, POC 04.5     Platelet Count, POC 285  142 - 424 K/uL   MPV 9.4  0 - 99.8 fL   CMP and PT/INR pending.  ASSESSMENT AND PLAN:  26 y.o. year old female with longstanding history of myalgias and bruising 1. Myalgias -Longstanding history -Toradol 60 mg IM -Prednisone 20 mg 3x3, 2x3, 1x3 #18 no RF  2. Bruising -Denies any spousal abuse, patient is alone in the room, I feel that she is being 100% honest with me -No known injury or trauma -Will check a CMP and PT/INR -This is  almost certainly from her Lexapro, however she is tolerating it quite well -Her platelets are normal so will continue treatment at current time -Discussed with Dr. Cleta Alberts   Signed, Eula Listen, PA-C 12/03/2011 2:52 PM

## 2011-12-09 ENCOUNTER — Ambulatory Visit (INDEPENDENT_AMBULATORY_CARE_PROVIDER_SITE_OTHER): Payer: 59 | Admitting: Physician Assistant

## 2011-12-09 ENCOUNTER — Encounter: Payer: Self-pay | Admitting: Physician Assistant

## 2011-12-09 VITALS — BP 117/75 | HR 86 | Temp 98.7°F | Resp 16 | Ht 64.25 in | Wt 138.2 lb

## 2011-12-09 DIAGNOSIS — M25559 Pain in unspecified hip: Secondary | ICD-10-CM

## 2011-12-09 DIAGNOSIS — M62838 Other muscle spasm: Secondary | ICD-10-CM

## 2011-12-09 MED ORDER — KETOROLAC TROMETHAMINE 60 MG/2ML IM SOLN
60.0000 mg | Freq: Once | INTRAMUSCULAR | Status: AC
  Administered 2011-12-09: 60 mg via INTRAMUSCULAR

## 2011-12-09 MED ORDER — METHOCARBAMOL 750 MG PO TABS: 750.0000 mg | ORAL_TABLET | Freq: Four times a day (QID) | ORAL | Status: DC

## 2011-12-09 NOTE — Progress Notes (Signed)
Patient ID: Teresa Holden MRN: [id number], DOB: 12-27-1985, 26 y.o. Date of Encounter: 12/09/2011, 4:36 PM  Primary Physician: Carola Frost  Chief Complaint: Left hip pain  HPI: 26 y.o. year old female with history below presents with continued left hip pain. See office visit from 12/03/11. Patient initially mentioned left leg/hip pain that began on 12/02/11 that caused her to miss work. She was treated with Toradol 60 mg IM on 10/29 that seemed to offer her good relief from her pain and given a prednisone taper that did not seem to help. She comes in today with continued pain along the posterior left hip. No injury or trauma to this area. Pain is sharp. Pain will sometimes radiate distally down to her left knee. Never with any weakness or paraesthesias. Has been taking ibuprofen without much success. Has been using a heating pad with good success. When she tries to stand from a sitting position the posterior left hip feels tight. No loss of bowel or bladder function.     Past Medical History  Diagnosis Date  . Sjogren's syndrome   . Migraines   . Anxiety   . ADD (attention deficit disorder with hyperactivity)      Home Meds: Prior to Admission medications   Medication Sig Start Date End Date Taking? Authorizing Provider  ALPRAZolam Prudy Feeler) 0.5 MG tablet 1/2-1 in am and every 6 hrs while awake.  May take up to 2 hs 10/16/11  Yes Anders Simmonds, PA-C  amphetamine-dextroamphetamine (ADDERALL) 30 MG tablet Take 1 tablet (30 mg total) by mouth daily. Do not fill before 10/04/2011 08/04/11  Yes Phillips Odor, MD  escitalopram (LEXAPRO) 10 MG tablet Take 1 tablet (10 mg total) by mouth daily. 10/16/11 01/14/12 Yes Anders Simmonds, PA-C  levonorgestrel (MIRENA) 20 MCG/24HR IUD 1 each by Intrauterine route once.   Yes Historical Provider, MD  albuterol (PROVENTIL HFA;VENTOLIN HFA) 108 (90 BASE) MCG/ACT inhaler Inhale 2 puffs into the lungs every 4 (four) hours as needed for wheezing.  04/12/11 04/11/12  Ryan M Dunn, PA-C  benzonatate (TESSALON) 100 MG capsule Take 100 mg by mouth 3 (three) times daily as needed.    Historical Provider, MD  cetirizine (ZYRTEC) 10 MG tablet Take 10 mg by mouth daily.    Historical Provider, MD  chlorpheniramine-HYDROcodone (TUSSIONEX PENNKINETIC ER) 10-8 MG/5ML LQCR Take 5 mLs by mouth every 12 (twelve) hours as needed (cough). 08/04/11   Phillips Odor, MD  clonazePAM (KLONOPIN) 0.5 MG tablet Take 1 tablet (0.5 mg total) by mouth 2 (two) times daily as needed for anxiety. 06/24/11 07/24/11  Ryan M Dunn, PA-C  predniSONE (DELTASONE) 20 MG tablet 3 PO FOR 3 DAYS, 2 PO FOR 3 DAYS, 1 PO FOR 3 DAYS 12/03/11  No Ryan M Dunn, PA-C  pseudoephedrine-guaifenesin [facility] D) 60-600 MG per tablet Take 1 tablet by mouth every 12 (twelve) hours. 08/04/11 08/03/12  Phillips Odor, MD  topiramate (TOPAMAX) 25 MG tablet 1 tab po qhs for one week, then increase to 2 tabs po qhs for one week, then 3 tabs po qhs for one week, then 4 tabs po qhs for one week. 03/14/11   Sondra Barges, PA-C    Allergies:  Allergies  Allergen Reactions  . Penicillins Rash    History   Social History  . Marital Status: Married    Spouse Name: N/A    Number of Children: N/A  . Years of Education: N/A   Occupational History  . Not on file.  Social History Main Topics  . Smoking status: Never Smoker   . Smokeless tobacco: Not on file  . Alcohol Use: Not on file  . Drug Use: Not on file  . Sexually Active: Not on file   Other Topics Concern  . Not on file   Social History Narrative  . No narrative on file     Review of Systems: Constitutional: negative for chills, fever, night sweats, weight changes, or fatigue  Musculoskeletal: see above Dermatological: negative for rash Neurologic: negative for headache, dizziness, or syncope    Physical Exam: Blood pressure 117/75, pulse 86, temperature 98.7 F (37.1 C), temperature source Oral, resp. rate 16, height 5' 4.25"  (1.632 m), weight 138 lb 3.2 oz (62.687 kg), last menstrual period 12/03/2010, SpO2 100.00%., Body mass index is 23.54 kg/(m^2). General: Well developed, well nourished, in no acute distress. Head: Normocephalic, atraumatic, eyes without discharge, sclera non-icteric, nares are without discharge.  Neck: Supple. Lungs: Breathing is unlabored. Heart: Regular rate. Msk:  Strength and tone normal for age. No bony TTP of hip or left leg. FROM of left leg without pain. 5/5 strength. No midline pain.  Extremities/Skin: Warm and dry. No clubbing or cyanosis. No edema. No rashes or suspicious lesions. Neuro: Alert and oriented X 3. Moves all extremities spontaneously. Gait is normal. CNII-XII grossly in tact. Psych:  Responds to questions appropriately with a normal affect.     ASSESSMENT AND PLAN:  26 y.o. year old female with left leg/hip pain secondary to muscle spasm. -Trial of Robaxin 750 mg 1 po qid #40 RF 2 -Toradol 60 mg IM in office -If pain persists will need plain films -Patient to call with update in 48 hours    Signed, Eula Listen, PA-C 12/09/2011 4:36 PM

## 2012-01-08 ENCOUNTER — Telehealth: Payer: Self-pay

## 2012-01-08 DIAGNOSIS — F988 Other specified behavioral and emotional disorders with onset usually occurring in childhood and adolescence: Secondary | ICD-10-CM

## 2012-01-08 MED ORDER — ALPRAZOLAM 0.5 MG PO TABS: ORAL_TABLET | ORAL | Status: DC

## 2012-01-08 MED ORDER — AMPHETAMINE-DEXTROAMPHETAMINE 30 MG PO TABS: 30.0000 mg | ORAL_TABLET | Freq: Every day | ORAL | Status: DC

## 2012-01-08 NOTE — Telephone Encounter (Signed)
Rx's given to patient she is advised she needs follow up

## 2012-01-08 NOTE — Telephone Encounter (Signed)
Ok to refill each once. However, will need an OV for next due to [facility] guidelines.

## 2012-01-08 NOTE — Telephone Encounter (Signed)
PT REQUESTS REFILL ON ADDERALL 30MG  ONCE DAILY AND LAST XANAX RX 0.5MG  #120 TAKING UP TO 4 TABS PER DAY   PT PH [phone] (AT DESK)

## 2012-01-08 NOTE — Telephone Encounter (Signed)
Please advise on this, I think she needs office visit.

## 2012-02-24 ENCOUNTER — Ambulatory Visit (INDEPENDENT_AMBULATORY_CARE_PROVIDER_SITE_OTHER): Payer: 59 | Admitting: Physician Assistant

## 2012-02-24 VITALS — BP 121/71 | HR 109 | Temp 98.9°F | Resp 16 | Ht 63.0 in | Wt 141.2 lb

## 2012-02-24 DIAGNOSIS — R110 Nausea: Secondary | ICD-10-CM

## 2012-02-24 DIAGNOSIS — J111 Influenza due to unidentified influenza virus with other respiratory manifestations: Secondary | ICD-10-CM

## 2012-02-24 DIAGNOSIS — R509 Fever, unspecified: Secondary | ICD-10-CM

## 2012-02-24 LAB — POCT CBC
Granulocyte percent: 77.3 %G (ref 37–80)
HCT, POC: 43.7 % (ref 37.7–47.9)
Hemoglobin: 13.4 g/dL (ref 12.2–16.2)
Lymph, poc: 1.4 (ref 0.6–3.4)
MCH, POC: 26.7 pg — AB (ref 27–31.2)
MCHC: 30.7 g/dL — AB (ref 31.8–35.4)
MCV: 87 fL (ref 80–97)
MID (cbc): 0.3 (ref 0–0.9)
MPV: 10.3 fL (ref 0–99.8)
POC Granulocyte: 5.8 (ref 2–6.9)
POC LYMPH PERCENT: 18.6 %L (ref 10–50)
POC MID %: 4.1 %M (ref 0–12)
Platelet Count, POC: 283 10*3/uL (ref 142–424)
RBC: 5.02 M/uL (ref 4.04–5.48)
RDW, POC: 13.4 %
WBC: 7.5 10*3/uL (ref 4.6–10.2)

## 2012-02-24 LAB — POCT INFLUENZA A/B
Influenza A, POC: NEGATIVE
Influenza B, POC: NEGATIVE

## 2012-02-24 MED ORDER — OSELTAMIVIR PHOSPHATE 75 MG PO CAPS: 75.0000 mg | ORAL_CAPSULE | Freq: Two times a day (BID) | ORAL | Status: DC

## 2012-02-24 MED ORDER — ONDANSETRON 4 MG PO TBDP: 4.0000 mg | ORAL_TABLET | Freq: Three times a day (TID) | ORAL | Status: DC | PRN

## 2012-02-24 MED ORDER — DICYCLOMINE HCL 10 MG PO CAPS: 10.0000 mg | ORAL_CAPSULE | Freq: Three times a day (TID) | ORAL | Status: DC

## 2012-02-24 NOTE — Progress Notes (Signed)
Patient ID: HECTOR VENNE MRN: [id number], DOB: [birth year], 27 y.o. Date of Encounter: 02/24/2012, 1:32 PM  Primary Physician: Carola Frost  Chief Complaint: Fever, chills, myalgias, nausea, headache, neck ache for 1 day  HPI: 26 y.o. year old female with history below presents with fever, chills, myalgias, nausea, headache, and neck ache for one day. Began to feel bad the previous day then symptoms worsened while she was at work today. Husband is sick with the same symptoms over the same time span. Known T max 99.5 after taking an Aleave today. Mild sight sided sore throat and maxillary sinus pressure. No congestion or cough. Some nausea without emesis. Abdominal cramping. No diarrhea. No neck stiffness, just sore. No rashes. She did receive her flu shot this year, husband did not. She does have a child at home that is feeling ok at this time. Took a Zofran 4 mg ODT earlier, helped some.    Past Medical History  Diagnosis Date  . Sjogren's syndrome   . Migraines   . Anxiety   . ADD (attention deficit disorder with hyperactivity)      Home Meds: Prior to Admission medications   Medication Sig Start Date End Date Taking? Authorizing Provider  ALPRAZolam Prudy Feeler) 0.5 MG tablet 1/2-1 in am and every 6 hrs while awake.  May take up to 2 hs 01/08/12  Yes Ryan M Dunn, PA-C  amphetamine-dextroamphetamine (ADDERALL) 30 MG tablet Take 1 tablet (30 mg total) by mouth daily. 01/08/12  Yes Ryan M Dunn, PA-C  levonorgestrel (MIRENA) 20 MCG/24HR IUD 1 each by Intrauterine route once.   Yes Historical Provider, MD  albuterol (PROVENTIL HFA;VENTOLIN HFA) 108 (90 BASE) MCG/ACT inhaler Inhale 2 puffs into the lungs every 4 (four) hours as needed for wheezing. 04/12/11 04/11/12  Raymon Mutton Dunn, PA-C  clonazePAM (KLONOPIN) 0.5 MG tablet Take 1 tablet (0.5 mg total) by mouth 2 (two) times daily as needed for anxiety. 06/24/11 07/24/11  Ryan M Dunn, PA-C  escitalopram (LEXAPRO) 10 MG tablet Take 1 tablet (10 mg  total) by mouth daily. 10/16/11 01/14/12  Anders Simmonds, PA-C    Allergies:  Allergies  Allergen Reactions  . Penicillins Rash    History   Social History  . Marital Status: Married    Spouse Name: N/A    Number of Children: N/A  . Years of Education: N/A   Occupational History  . Not on file.   Social History Main Topics  . Smoking status: Never Smoker   . Smokeless tobacco: Not on file  . Alcohol Use: Not on file  . Drug Use: Not on file  . Sexually Active: Not on file   Other Topics Concern  . Not on file   Social History Narrative  . No narrative on file     Review of Systems: Constitutional: positive for fever, chills, myalgias, and fatigue. negative for night sweats, or weight changes HEENT: see above Cardiovascular: negative for chest pain or palpitations Respiratory: negative for hemoptysis, wheezing, dyspnea, shortness of breath, or cough Abdominal: positive for abdominal cramping and nausea. negative for vomiting or diarrhea Dermatological: negative for rash Neurologic: positive for headache. negative for dizziness or syncope All other systems reviewed and are otherwise negative with the exception to those above and in the HPI.   Physical Exam: Blood pressure 121/71, pulse 109, temperature 98.9 F (37.2 C), temperature source Oral, resp. rate 16, height 5\' 3"  (1.6 m), weight 141 lb 3.2 oz (64.048 kg), SpO2 98.00%., Body mass  index is 25.01 kg/(m^2). General: Well developed, well nourished, in no acute distress. Head: Normocephalic, atraumatic, eyes without discharge, sclera non-icteric, nares are congested. Bilateral auditory canals clear, TM's are without perforation, pearly grey and translucent with reflective cone of light bilaterally. Oral cavity moist, posterior pharynx without exudate, erythema, peritonsillar abscess, or post nasal drip. Uvula midline.  Neck: Supple. No thyromegaly. Full ROM. No lymphadenopathy. Lungs: Clear bilaterally to  auscultation without wheezes, rales, or rhonchi. Breathing is unlabored. Heart: RRR with S1 S2. No murmurs, rubs, or gallops appreciated. Msk:  Strength and tone normal for age. Extremities/Skin: Warm and dry. No clubbing or cyanosis. No edema. No rashes or suspicious lesions. Neuro: Alert and oriented X 3. Moves all extremities spontaneously. Gait is normal. CNII-XII grossly in tact. Psych:  Responds to questions appropriately with a normal affect.   Labs: Results for orders placed in visit on 02/24/12  POCT CBC      Component Value Range   WBC 7.5  4.6 - 10.2 K/uL   Lymph, poc 1.4  0.6 - 3.4   POC LYMPH PERCENT 18.6  10 - 50 %L   MID (cbc) 0.3  0 - 0.9   POC MID % 4.1  0 - 12 %M   POC Granulocyte 5.8  2 - 6.9   Granulocyte percent 77.3  37 - 80 %G   RBC 5.02  4.04 - 5.48 M/uL   Hemoglobin 13.4  12.2 - 16.2 g/dL   HCT, POC 01.0  27.2 - 47.9 %   MCV 87.0  80 - 97 fL   MCH, POC 26.7 (*) 27 - 31.2 pg   MCHC 30.7 (*) 31.8 - 35.4 g/dL   RDW, POC 53.6     Platelet Count, POC 283  142 - 424 K/uL   MPV 10.3  0 - 99.8 fL  POCT INFLUENZA A/B      Component Value Range   Influenza A, POC Negative     Influenza B, POC Negative       ASSESSMENT AND PLAN:  27 y.o. year old female with influenza like illness, abdominal cramping, and nausea.  -Tamiflu 75 mg 1 po bid #10 no RF -Bentyl 10 mg 1 po qid #40 no RF -Zofran 4mg  ODT given in office -Zofran 4 mg ODT 1 po q 8 hours prn nausea #20 RF 6 -Rest  -Push fluids -Out of work until afebrile -Return to clinic precautions -Let me know if child becomes symptomatic -Husband treated as well as he is a patient of mine, see his note  Signed, Eula Listen, PA-C 02/24/2012 1:32 PM

## 2012-03-01 ENCOUNTER — Telehealth: Payer: Self-pay

## 2012-03-01 NOTE — Telephone Encounter (Signed)
Patient was diagnosed with the flu last Monday and needs a note for work for leaving early Monday and missing work Tuesday.

## 2012-03-02 NOTE — Telephone Encounter (Signed)
Note done. Pt notified

## 2012-03-13 ENCOUNTER — Ambulatory Visit (INDEPENDENT_AMBULATORY_CARE_PROVIDER_SITE_OTHER): Payer: 59 | Admitting: Physician Assistant

## 2012-03-13 VITALS — BP 136/71 | HR 80 | Temp 98.5°F | Resp 16 | Ht 63.0 in | Wt 142.4 lb

## 2012-03-13 DIAGNOSIS — F419 Anxiety disorder, unspecified: Secondary | ICD-10-CM

## 2012-03-13 DIAGNOSIS — F988 Other specified behavioral and emotional disorders with onset usually occurring in childhood and adolescence: Secondary | ICD-10-CM

## 2012-03-13 DIAGNOSIS — L708 Other acne: Secondary | ICD-10-CM

## 2012-03-13 DIAGNOSIS — F411 Generalized anxiety disorder: Secondary | ICD-10-CM

## 2012-03-13 DIAGNOSIS — G4700 Insomnia, unspecified: Secondary | ICD-10-CM

## 2012-03-13 DIAGNOSIS — L709 Acne, unspecified: Secondary | ICD-10-CM

## 2012-03-13 MED ORDER — AMPHETAMINE-DEXTROAMPHETAMINE 30 MG PO TABS: 30.0000 mg | ORAL_TABLET | Freq: Every day | ORAL | Status: DC

## 2012-03-13 MED ORDER — CLINDAMYCIN PHOS-BENZOYL PEROX 1-5 % EX GEL: Freq: Two times a day (BID) | CUTANEOUS | Status: DC

## 2012-03-13 MED ORDER — TRETINOIN 0.025 % EX CREA: TOPICAL_CREAM | Freq: Every day | CUTANEOUS | Status: DC

## 2012-03-13 MED ORDER — ALPRAZOLAM 0.5 MG PO TABS: ORAL_TABLET | ORAL | Status: DC

## 2012-03-13 NOTE — Progress Notes (Signed)
Patient ID: Teresa Holden MRN: [id number], DOB: 13-Jul-1985, 27 y.o. Date of Encounter: 03/13/2012, 7:39 PM  Primary Physician: Carola Frost  Chief Complaint: Medication refill and acne  HPI: 27 y.o. year old female with history below presents for medication refill and to discuss mild acne.  1) Anxiety: Doing well. No issues or complaints. Tolerating Xanax 0.5 mg 1/2-1 in AM and every 6 hours while awake, and up to 2 qhs. Enjoying her new position. Much less stressful. Needs refill.  2) ADD: Doing well. No issues or complaints. Tolerating Adderall 30 mg 1 po daily. No chest pain, insomnia, decreased appetite, or palpitations. Needs refill.   3) Mild acne: Requests either BenzaClin or Retin-A. Has tried both and they seem to help. Gets break outs from time to time.    Past Medical History  Diagnosis Date  . Sjogren's syndrome   . Migraines   . Anxiety   . ADD (attention deficit disorder with hyperactivity)      Home Meds: Prior to Admission medications   Medication Sig Start Date End Date Taking? Authorizing Provider  ALPRAZolam Prudy Feeler) 0.5 MG tablet 1/2-1 in am and every 6 hrs while awake.  May take up to 2 hs 03/13/12  Yes Ryan M Dunn, PA-C  amphetamine-dextroamphetamine (ADDERALL) 30 MG tablet Take 1 tablet (30 mg total) by mouth daily. 03/13/12  Yes Ryan M Dunn, PA-C  dicyclomine (BENTYL) 10 MG capsule Take 1 capsule (10 mg total) by mouth 4 (four) times daily -  before meals and at bedtime. 02/24/12  Yes Ryan M Dunn, PA-C  levonorgestrel (MIRENA) 20 MCG/24HR IUD 1 each by Intrauterine route once.   Yes Historical Provider, MD  albuterol (PROVENTIL HFA;VENTOLIN HFA) 108 (90 BASE) MCG/ACT inhaler Inhale 2 puffs into the lungs every 4 (four) hours as needed for wheezing. 04/12/11 04/11/12 No Sondra Barges, PA-C                                              Allergies:  Allergies  Allergen Reactions  . Penicillins Rash    History   Social History  . Marital Status: Married     Spouse Name: N/A    Number of Children: N/A  . Years of Education: N/A   Occupational History  . Not on file.   Social History Main Topics  . Smoking status: Never Smoker   . Smokeless tobacco: Not on file  . Alcohol Use: Not on file  . Drug Use: Not on file  . Sexually Active: Not on file   Other Topics Concern  . Not on file   Social History Narrative  . No narrative on file     Review of Systems: Constitutional: negative for chills, fever, night sweats, weight changes, or fatigue  HEENT: negative for vision changes, hearing loss, congestion, rhinorrhea, ST, epistaxis, or sinus pressure Cardiovascular: negative for chest pain or palpitations Respiratory: negative for hemoptysis, wheezing, shortness of breath, or cough Abdominal: negative for abdominal pain, nausea, vomiting, diarrhea, or constipation Dermatological: negative for rash Neurologic: negative for headache, dizziness, or syncope All other systems reviewed and are otherwise negative with the exception to those above and in the HPI.   Physical Exam: Blood pressure 136/71, pulse 80, temperature 98.5 F (36.9 C), temperature source Oral, resp. rate 16, height 5\' 3"  (1.6 m), weight 142 lb 6.4 oz (64.592 kg),  SpO2 100.00%., Body mass index is 25.22 kg/(m^2). General: Well developed, well nourished, in no acute distress. Head: Normocephalic, atraumatic, eyes without discharge, sclera non-icteric, nares are without discharge.   Neck: Supple. No thyromegaly. Full ROM. No lymphadenopathy. Lungs: Clear bilaterally to auscultation without wheezes, rales, or rhonchi. Breathing is unlabored. Heart: RRR with S1 S2. No murmurs, rubs, or gallops appreciated. Msk:  Strength and tone normal for age. Extremities/Skin: Warm and dry. No clubbing or cyanosis. No edema. No rashes or suspicious lesions. Mild comedonal acne along nasal bridge.  Neuro: Alert and oriented X 3. Moves all extremities spontaneously. Gait is normal.  CNII-XII grossly in tact. Psych:  Responds to questions appropriately with a normal affect.     ASSESSMENT AND PLAN:  27 y.o. female with well controlled anxiety, ADD, and mild acne.  1) Anxiety -Well controlled -Continue current medications -Refilled Xanax 0.5 mg 1/2-1 in AM and every 6 hours while awake. May take up to 2 hs. #120 no RF -May call for refills for 5 months, then recheck  2) ADD -Well controlled -Continue current medications -Refilled Adderall 30 mg 1 po daily #30 no RF -May call for refills for 5 months, then recheck  3) Mild acne -BenzaClin Gel Apply topically bid #25 grams RF 6 -Retin ! 0.025% Apply topically qhs #45 grams RF 6   Signed, Eula Listen, PA-C 03/13/2012 7:39 PM

## 2012-03-16 ENCOUNTER — Ambulatory Visit: Payer: 59 | Admitting: Physician Assistant

## 2012-03-18 ENCOUNTER — Telehealth: Payer: Self-pay

## 2012-03-18 MED ORDER — FLUCONAZOLE 100 MG PO TABS: 100.0000 mg | ORAL_TABLET | Freq: Every day | ORAL | Status: DC

## 2012-03-18 MED ORDER — METRONIDAZOLE 500 MG PO TABS: 500.0000 mg | ORAL_TABLET | Freq: Two times a day (BID) | ORAL | Status: DC

## 2012-03-18 NOTE — Telephone Encounter (Signed)
Received prior auth request for pt's Retin A for acne. LMOM for pt to CB to give h/o other meds tried/failed.

## 2012-03-18 NOTE — Telephone Encounter (Signed)
See her husband's office visit from 11/14/11. These two medications helped to resolve his urinary symptoms. It would certainly be reasonable to co-treat, however this is not typical treatment course but it may help. I will send them in. Please let her know there may be some breakthrough bleeding due to the Flagyl with her Mirena.

## 2012-03-18 NOTE — Telephone Encounter (Signed)
Teresa Holden,  Patient requesting a prescription for Flagyl and Diflucan.

## 2012-03-18 NOTE — Telephone Encounter (Signed)
Filled out PA form and faxed back to ins. Waiting for decision.

## 2012-03-18 NOTE — Telephone Encounter (Signed)
I don't think it is appropriate to give this without office visit, patient wants message sent to [facility], please advise

## 2012-03-18 NOTE — Telephone Encounter (Signed)
Notified pt that Ryan sent in the two rxs for her. Advised that if Sxs persist she should come in for eval.  Also asked what meds she has used in the past for acne that have not been effective. She stated she has used the Neutrogena and other OTC salicylic acid products and Oxy 5. Also has been Rxd tetracycline in the past. The Retin-A has been the most effective for her acne.  Called ins and was told they would start PA and then hung up. If I don't receive fax in the next 2 hours will call them back to make sure they have faxed it.

## 2012-03-23 NOTE — Telephone Encounter (Signed)
Received approval for prior auth of Retin-A through 03/19/13. Faxed approval to [facility] pharmacy and notified pt on VM.

## 2012-04-29 ENCOUNTER — Telehealth: Payer: Self-pay

## 2012-04-29 DIAGNOSIS — F988 Other specified behavioral and emotional disorders with onset usually occurring in childhood and adolescence: Secondary | ICD-10-CM

## 2012-04-29 MED ORDER — CLONAZEPAM 0.5 MG PO TABS: 0.5000 mg | ORAL_TABLET | Freq: Two times a day (BID) | ORAL | Status: DC | PRN

## 2012-04-29 MED ORDER — AMPHETAMINE-DEXTROAMPHETAMINE 30 MG PO TABS: 30.0000 mg | ORAL_TABLET | Freq: Every day | ORAL | Status: DC

## 2012-04-29 NOTE — Telephone Encounter (Signed)
Adderall refilled. Please clarify clonazepam vs alprazolam.

## 2012-04-29 NOTE — Telephone Encounter (Signed)
Spoke with Teresa Holden, she wants to actually speak to you about her husband Thayer Ohm. She also states she likes the clonazepam better than the xanax and would like a refill on that. She said you  Gave her this medication a while ago and has tried it and it is doing better for her right now. Please advise.

## 2012-04-29 NOTE — Telephone Encounter (Signed)
meds faxed to [facility].

## 2012-04-29 NOTE — Telephone Encounter (Signed)
Patient prefers the clonazepam over the xanax at this time. Clonazepam refilled. At the TL desk.

## 2012-04-29 NOTE — Telephone Encounter (Signed)
Patient requesting refills on adderal and clonazapam.

## 2012-04-29 NOTE — Telephone Encounter (Signed)
Pended Adderall, please advise on refills of this and Clonazepam. ? Should she be on clonazepam and alprazolam?

## 2012-05-27 ENCOUNTER — Other Ambulatory Visit: Payer: Self-pay | Admitting: Physician Assistant

## 2012-05-27 NOTE — Telephone Encounter (Signed)
Pt is requesting a refill on adderall 

## 2012-07-13 ENCOUNTER — Other Ambulatory Visit: Payer: Self-pay | Admitting: Physician Assistant

## 2012-07-14 ENCOUNTER — Telehealth: Payer: Self-pay

## 2012-07-14 DIAGNOSIS — F988 Other specified behavioral and emotional disorders with onset usually occurring in childhood and adolescence: Secondary | ICD-10-CM

## 2012-07-14 NOTE — Telephone Encounter (Signed)
Pt is requesting a refill for Adderall

## 2012-07-16 MED ORDER — AMPHETAMINE-DEXTROAMPHETAMINE 30 MG PO TABS: 30.0000 mg | ORAL_TABLET | Freq: Every day | ORAL | Status: DC

## 2012-07-16 NOTE — Telephone Encounter (Signed)
Medication refilled, printed, signed, and at the TL desk.  

## 2012-07-17 NOTE — Telephone Encounter (Signed)
Notified pt Rx ready. 

## 2012-09-21 ENCOUNTER — Telehealth: Payer: Self-pay

## 2012-09-21 DIAGNOSIS — G4700 Insomnia, unspecified: Secondary | ICD-10-CM

## 2012-09-21 DIAGNOSIS — F988 Other specified behavioral and emotional disorders with onset usually occurring in childhood and adolescence: Secondary | ICD-10-CM

## 2012-09-21 NOTE — Telephone Encounter (Signed)
Pt is requesting for a refill on adderall and klonopin

## 2012-09-22 MED ORDER — CLONAZEPAM 0.5 MG PO TABS: 0.5000 mg | ORAL_TABLET | Freq: Two times a day (BID) | ORAL | Status: DC | PRN

## 2012-09-22 MED ORDER — AMPHETAMINE-DEXTROAMPHETAMINE 30 MG PO TABS: 30.0000 mg | ORAL_TABLET | Freq: Every day | ORAL | Status: DC

## 2012-09-22 NOTE — Telephone Encounter (Signed)
Spoke with pt advised Rx ready to be picked up

## 2012-09-22 NOTE — Telephone Encounter (Signed)
Medications refilled

## 2012-11-16 ENCOUNTER — Other Ambulatory Visit: Payer: Self-pay | Admitting: Physician Assistant

## 2012-11-16 DIAGNOSIS — F988 Other specified behavioral and emotional disorders with onset usually occurring in childhood and adolescence: Secondary | ICD-10-CM

## 2012-11-16 MED ORDER — AMPHETAMINE-DEXTROAMPHETAMINE 30 MG PO TABS: 30.0000 mg | ORAL_TABLET | Freq: Every day | ORAL | Status: DC

## 2012-11-16 NOTE — Telephone Encounter (Signed)
PT IN NEED OF HER ADDERALL. PLEASE CALL [phone]

## 2012-11-17 ENCOUNTER — Other Ambulatory Visit: Payer: Self-pay | Admitting: Physician Assistant

## 2012-11-18 ENCOUNTER — Ambulatory Visit (INDEPENDENT_AMBULATORY_CARE_PROVIDER_SITE_OTHER): Payer: 59

## 2012-11-18 DIAGNOSIS — Z23 Encounter for immunization: Secondary | ICD-10-CM

## 2012-11-18 NOTE — Addendum Note (Signed)
Addended by: Cydney Ok on: 11/18/2012 12:50 PM   Modules accepted: Level of Service

## 2013-01-22 ENCOUNTER — Telehealth: Payer: Self-pay | Admitting: Radiology

## 2013-01-22 ENCOUNTER — Other Ambulatory Visit: Payer: Self-pay | Admitting: Radiology

## 2013-01-22 DIAGNOSIS — G4700 Insomnia, unspecified: Secondary | ICD-10-CM

## 2013-01-22 DIAGNOSIS — F988 Other specified behavioral and emotional disorders with onset usually occurring in childhood and adolescence: Secondary | ICD-10-CM

## 2013-01-22 MED ORDER — AMPHETAMINE-DEXTROAMPHETAMINE 30 MG PO TABS: 30.0000 mg | ORAL_TABLET | Freq: Every day | ORAL | Status: DC

## 2013-01-22 MED ORDER — CLONAZEPAM 0.5 MG PO TABS: ORAL_TABLET | ORAL | Status: DC

## 2013-01-22 NOTE — Telephone Encounter (Signed)
Advised ready for pick up.

## 2013-01-22 NOTE — Telephone Encounter (Signed)
Patient requesting Adderall and klonopin, pended Last visit Feb.

## 2013-02-02 NOTE — Telephone Encounter (Signed)
Med encounter

## 2013-02-22 ENCOUNTER — Ambulatory Visit: Payer: Self-pay

## 2013-02-22 ENCOUNTER — Encounter: Payer: Self-pay | Admitting: Physician Assistant

## 2013-02-22 ENCOUNTER — Ambulatory Visit (INDEPENDENT_AMBULATORY_CARE_PROVIDER_SITE_OTHER): Payer: 59 | Admitting: Family Medicine

## 2013-02-22 ENCOUNTER — Ambulatory Visit: Payer: 59

## 2013-02-22 VITALS — BP 122/74 | HR 90 | Temp 99.1°F | Resp 16 | Ht 66.0 in | Wt 166.0 lb

## 2013-02-22 DIAGNOSIS — M79609 Pain in unspecified limb: Secondary | ICD-10-CM

## 2013-02-22 DIAGNOSIS — M7660 Achilles tendinitis, unspecified leg: Secondary | ICD-10-CM

## 2013-02-22 DIAGNOSIS — R0602 Shortness of breath: Secondary | ICD-10-CM

## 2013-02-22 DIAGNOSIS — M79673 Pain in unspecified foot: Secondary | ICD-10-CM

## 2013-02-22 MED ORDER — NAPROXEN 500 MG PO TABS: 500.0000 mg | ORAL_TABLET | Freq: Two times a day (BID) | ORAL | Status: DC

## 2013-02-22 MED ORDER — ALBUTEROL SULFATE HFA 108 (90 BASE) MCG/ACT IN AERS: 2.0000 | INHALATION_SPRAY | RESPIRATORY_TRACT | Status: DC | PRN

## 2013-02-22 NOTE — Progress Notes (Signed)
Subjective:    Patient ID: Teresa Holden, female    DOB: [birth year], 28 y.o.   MRN: [id number]  HPI Primary Physician: Marcille Blanco  Chief Complaint: Left heel pain x 3 weeks  HPI: 28 y.o. female with history below presents with left heel pain x 3 weeks. Pain is worse in the morning and when she runs on a treadmill. Patient recently began a workout routine without a friend who is a runner that includes running on a treadmill, elliptical, and leg presses. Since starting this routine she notes and increase in pain along the top portion of the left heel. The pain does not radiate. It is the worst when she runs, but it also hurts when she gets up in the morning. She does not recall the area ever becoming erythematous, swollen, or bruised. There is one spot on the top of the heel that she can push on and cause a fair amount of pain. It does not hurt to just walk normally.   She also requests a refill of her albuterol inhaler. She has one for use when she is exercising but it is out of date and she would like to update this. No recent flares.      Past Medical History  Diagnosis Date  . Sjogren's syndrome   . Migraines   . Anxiety   . ADD (attention deficit disorder with hyperactivity)      Home Meds: Prior to Admission medications   Medication Sig Start Date End Date Taking? Authorizing Provider  amphetamine-dextroamphetamine (ADDERALL) 30 MG tablet Take 1 tablet (30 mg total) by mouth daily. 01/22/13  Yes Ryan M Dunn, PA-C  clindamycin-benzoyl peroxide (BENZACLIN) gel Apply topically 2 (two) times daily. 03/13/12  Yes Ryan M Dunn, PA-C  clonazePAM (KLONOPIN) 0.5 MG tablet TAKE 1 TABLET BY MOUTH TWICE DAILY AS NEEDED FOR ANXIETY 01/22/13  Yes Ryan M Dunn, PA-C  levonorgestrel (MIRENA) 20 MCG/24HR IUD 1 each by Intrauterine route once.   Yes Historical Provider, MD  tretinoin (RETIN-A) 0.025 % cream Apply topically at bedtime. 03/13/12  Yes Ryan Lyn Hollingshead, PA-C    Allergies:  Allergies    Allergen Reactions  . Penicillins Rash    History   Social History  . Marital Status: Married    Spouse Name: N/A    Number of Children: N/A  . Years of Education: N/A   Occupational History  . Not on file.   Social History Main Topics  . Smoking status: Never Smoker   . Smokeless tobacco: Not on file  . Alcohol Use: Not on file  . Drug Use: Not on file  . Sexual Activity: Not on file   Other Topics Concern  . Not on file   Social History Narrative  . No narrative on file      Review of Systems  Respiratory: Positive for shortness of breath and wheezing.        With exercise.   Musculoskeletal: Positive for arthralgias and gait problem. Negative for back pain, joint swelling and myalgias.  Skin: Negative for color change, rash and wound.       Objective:   Physical Exam  Physical Exam: Blood pressure 122/74, pulse 90, temperature 99.1 F (37.3 C), resp. rate 16, height 5\' 6"  (1.676 m), weight 166 lb (75.297 kg), SpO2 99.00%., Body mass index is 26.81 kg/(m^2). General: Well developed, well nourished, in no acute distress. Head: Normocephalic, atraumatic, eyes without discharge, sclera non-icteric, nares are without discharge.  Neck: Supple.  Full ROM.  Lungs: Breathing is unlabored. Heart: Regular rate. Msk:  Strength and tone normal for age. Extremities/Skin: Warm and dry. No clubbing or cyanosis. No edema. No rashes or suspicious lesions. Left heel: No erythema, ecchymosis, or STS. Pinpoint TTP over the lateral aspect of the insertion of the Achilles tendon. There otherwise is no TTP throughout the calcaneous, including compression, or Achilles tendon. FROM of the left foot/ankle, including flexion, extension, inversion, eversion, and Talar tilt. Negative anterior drawer. Bilateral Thompson squeeze test unremarkable. Pulses 2+ throughout. Cap refill less than 2 seconds throughout.    Neuro: Alert and oriented X 3. Moves all extremities spontaneously. Gait is  normal. CNII-XII grossly in tact. Psych:  Responds to questions appropriately with a normal affect.   Left foot:  [facility] reading (PRIMARY) by  Dr. Carlota Raspberry. Negative     Assessment & Plan:  28 year old female with achilles tendonitis, heel pain, and SOB with exercise  1) Achilles tendonitis/heel pain -Naprosyn 500 mg 1 po bid #60 RF 1  -Rest affected area x 2 weeks -If she is doing well at that time she may advance activity as tolerated, otherwise RTC 2-4 weeks for recheck  -If pain persists can consider further imaging  2) SOB with exercise  -Has an old albuterol inhaler at home, requests a new one -Proventil 2 puffs inhaled q 4-6 hours prn #1 RF 2    Christell Faith, MHS, PA-C [facility] [place], [place] [address] [place] 02/22/2013 2:51 PM

## 2013-02-22 NOTE — Patient Instructions (Signed)
Achilles Tendinitis °Achilles tendinitis is inflammation of the tough, cord-like band that attaches the lower muscles of your leg to your heel (Achilles tendon). It is usually caused by overusing the tendon and joint involved.  °CAUSES °Achilles tendinitis can happen because of: °· A sudden increase in exercise or activity (such as running). °· Doing the same exercises or activities (such as jumping) over and over. °· Not warming up calf muscles before exercising. °· Exercising in shoes that are worn out or not made for exercise. °· Having arthritis or a bone growth on the back of the heel bone. This can rub against the tendon and hurt the tendon. °SIGNS AND SYMPTOMS °The most common symptoms are: °· Pain in the back of the leg, just above the heel. The pain usually gets worse with exercise and better with rest. °· Stiffness or soreness in the back of the leg, especially in the morning. °· Swelling of the skin over the Achilles tendon. °· Trouble standing on tiptoe. °Sometimes, an Achilles tendon tears (ruptures). Symptoms of an Achilles tendon rupture can include: °· Sudden, severe pain in the back of the leg. °· Trouble putting weight on the foot or walking normally. °DIAGNOSIS °Achilles tendinitis will be diagnosed based on symptoms and a physical examination. An X-ray may be done to check if another condition is causing your symptoms. An MRI may be ordered if your health care provider suspects you may have completely torn your tendon, which is called an Achilles tendon rupture.  °TREATMENT  °Achilles tendinitis usually gets better over time. It can take weeks to months to heal completely. Treatment focuses on treating the symptoms and helping the injury heal. °HOME CARE INSTRUCTIONS  °· Rest your Achilles tendon and avoid activities that cause pain. °· Apply ice to the injured area: °· Put ice in a plastic bag. °· Place a towel between your skin and the bag. °· Leave the ice on for 20 minutes, 2 3 times a  day °· Try to avoid using the tendon (other than gentle range of motion) while the tendon is painful. Do not resume use until instructed by your health care provider. Then begin use gradually. Do not increase use to the point of pain. If pain does develop, decrease use and continue the above measures. Gradually increase activities that do not cause discomfort until you achieve normal use. °· Do exercises to make your calf muscles stronger and more flexible. Your health care provider or physical therapist can recommend exercises for you to do. °· Wrap your ankle with an elastic bandage or other wrap. This can help keep your tendon from moving too much. Your health care provider will show you how to wrap your ankle correctly. °· Only take over-the-counter or prescription medicines for pain, discomfort, or fever as directed by your health care provider. °SEEK MEDICAL CARE IF:  °· Your pain and swelling increase or pain is uncontrolled with medicines. °· You develop new, unexplained symptoms or your symptoms get worse. °· You are unable to move your toes or foot. °· You develop warmth and swelling in your foot. °· You have an unexplained temperature. °MAKE SURE YOU:  °· Understand these instructions. °· Will watch your condition. °· Will get help right away if you are not doing well or get worse. °Document Released: 10/31/2004 Document Revised: 11/11/2012 Document Reviewed: 09/02/2012 °Exit[employer]® Patient Information ©2014 [employer]. ° °

## 2013-02-24 ENCOUNTER — Telehealth: Payer: Self-pay

## 2013-02-24 NOTE — Progress Notes (Signed)
Xray read and patient discussed with Christell Faith, PA-C. Agree with assessment and plan of care per his note.

## 2013-02-24 NOTE — Telephone Encounter (Signed)
Teresa Holden, Bodine wants to discuss her last ov with you.  She has questions.   [phone]

## 2013-02-24 NOTE — Telephone Encounter (Signed)
Teresa Holden did you want to call pt?

## 2013-02-24 NOTE — Telephone Encounter (Signed)
LMOM to CB. 

## 2013-02-24 NOTE — Telephone Encounter (Signed)
Spoke with patient and answered questions. She wondered about heels vs flats. Also, the heel felt better with rest and was aggravated  with exercise this morning. I have advised her to not do any lower extremity exercises for the next week then she can slowly advance as tolerated. However, the instructions remain the same no elliptical or treadmill for at least 2 weeks. She can also pick up an ACE wrap for added support.

## 2013-03-05 ENCOUNTER — Telehealth: Payer: Self-pay

## 2013-03-05 NOTE — Telephone Encounter (Signed)
Patient states that she is still experiencing pain in her heel.  [phone]

## 2013-03-06 ENCOUNTER — Ambulatory Visit (INDEPENDENT_AMBULATORY_CARE_PROVIDER_SITE_OTHER): Payer: 59 | Admitting: Physician Assistant

## 2013-03-06 VITALS — BP 110/94 | HR 77 | Temp 98.5°F | Ht 65.0 in | Wt 166.2 lb

## 2013-03-06 DIAGNOSIS — M25579 Pain in unspecified ankle and joints of unspecified foot: Secondary | ICD-10-CM

## 2013-03-06 DIAGNOSIS — M7660 Achilles tendinitis, unspecified leg: Secondary | ICD-10-CM

## 2013-03-06 MED ORDER — PREDNISONE 20 MG PO TABS: ORAL_TABLET | ORAL | Status: DC

## 2013-03-06 NOTE — Telephone Encounter (Signed)
See OV from 03/06/13.

## 2013-03-06 NOTE — Progress Notes (Signed)
Subjective:    Patient ID: Teresa Holden, female    DOB: August 07, 1985, 28 y.o.   MRN: [id number]  HPI Primary Physician: Marcille Blanco  Chief Complaint: Follow up left heel pain  HPI: 28 y.o. female with history below presents for follow up of left heel pain. Patient was initially evaluated on 02/22/13 with a 3 week history of left heel pain at the insertion of the achilles tendon. Her pain was worse in the morning and when she ran on a treadmill. She had recently began a workout routine after a recent sedentary time period in her life. Her work out routine included treadmill, elliptical, weights, and the leg press. Her plain films were negative on 02/22/13. She was started on Naprosyn and advised to avoid the above activities.   She comes in today stating that her left foot continues to be sore along the insertion of the achilles tendon as well as along the medial and lateral portions of the proximal mid foot now. She also noted a small area of hyperpigmentation along the medial aspect of the midfoot the previous night. This area is not tender to the touch. She states that she was wrestling with her husband the previous night and she did hit her heel on the metal bed frame and that may have aggravated her heel some which may be causing some of her discomfort this morning. It does hurt her to walk on her toes at this point.      Past Medical History  Diagnosis Date  . Sjogren's syndrome   . Migraines   . Anxiety   . ADD (attention deficit disorder with hyperactivity)      Home Meds: Prior to Admission medications   Medication Sig Start Date End Date Taking? Authorizing Provider  albuterol (PROVENTIL HFA;VENTOLIN HFA) 108 (90 BASE) MCG/ACT inhaler Inhale 2 puffs into the lungs every 4 (four) hours as needed for wheezing or shortness of breath. 02/22/13  Yes Ryan M Dunn, PA-C  amphetamine-dextroamphetamine (ADDERALL) 30 MG tablet Take 1 tablet (30 mg total) by mouth daily. 01/22/13  Yes  Ryan M Dunn, PA-C  clindamycin-benzoyl peroxide (BENZACLIN) gel Apply topically 2 (two) times daily. 03/13/12  Yes Ryan M Dunn, PA-C  clonazePAM (KLONOPIN) 0.5 MG tablet TAKE 1 TABLET BY MOUTH TWICE DAILY AS NEEDED FOR ANXIETY 01/22/13  Yes Ryan M Dunn, PA-C  levonorgestrel (MIRENA) 20 MCG/24HR IUD 1 each by Intrauterine route once.   Yes Historical Provider, MD  naproxen (NAPROSYN) 500 MG tablet Take 1 tablet (500 mg total) by mouth 2 (two) times daily with a meal. 02/22/13  Yes Ryan M Dunn, PA-C  tretinoin (RETIN-A) 0.025 % cream Apply topically at bedtime. 03/13/12  Yes Ryan Lyn Hollingshead, PA-C           Allergies:  Allergies  Allergen Reactions  . Penicillins Rash    History   Social History  . Marital Status: Married    Spouse Name: N/A    Number of Children: N/A  . Years of Education: N/A   Occupational History  . Not on file.   Social History Main Topics  . Smoking status: Never Smoker   . Smokeless tobacco: Not on file  . Alcohol Use: Not on file  . Drug Use: Not on file  . Sexual Activity: Not on file   Other Topics Concern  . Not on file   Social History Narrative  . No narrative on file      Review of Systems  Musculoskeletal: Positive for arthralgias, gait problem, joint swelling and myalgias. Negative for back pain.  Skin: Positive for color change. Negative for pallor, rash and wound.       Objective:   Physical Exam  Physical Exam: Blood pressure 110/94, pulse 77, temperature 98.5 F (36.9 C), temperature source Oral, height 5\' 5"  (1.651 m), weight 166 lb 3.2 oz (75.388 kg), SpO2 99.00%., Body mass index is 27.66 kg/(m^2). General: Well developed, well nourished, in no acute distress. Head: Normocephalic, atraumatic, eyes without discharge, sclera non-icteric, nares are without discharge.  Neck: Supple. Full ROM.  Lungs: Breathing is unlabored. Heart: Regular rate. Msk:  Strength and tone normal for age. Extremities/Skin: Warm and dry. No clubbing or  cyanosis. No edema. No rashes or suspicious lesions. Left foot/ankle: Continues to be TTP over the superior aspect of the insertion of the Achilles tendon. There is no TTP with compression of the calcaneous. There is mild TTP of the Achilles. There is mild TTP of the medial and lateral malleoli. Flexion of the foot seems to be a little tight. It is not limited, just tight. Negative anterior drawer and Talar tilt. There is a small area of hyperpigmentation just distal to the medial malleoli. This area is not TTP. Pulses 2+ throughout. Cap refill less than 2 seconds.   Neuro: Alert and oriented X 3. Moves all extremities spontaneously. Gait is normal. CNII-XII grossly in tact. Psych:  Responds to questions appropriately with a normal affect.         Assessment & Plan:  28 year old female with likely achilles tendonitis and left heel pain. -Trial of prednisone 20 mg #18 3x3, 2x3, 1x3 no RF -PT -If symptoms persist consider MRI -Consider chiropractor/orthopedic  -Continue same restrictions   Christell Faith, MHS, PA-C [facility] [place], [place] [address] [place] 03/06/2013 2:32 PM

## 2013-03-07 NOTE — Telephone Encounter (Signed)
Pt came in to be seen yesterday 

## 2013-03-18 ENCOUNTER — Telehealth: Payer: Self-pay

## 2013-03-18 NOTE — Telephone Encounter (Signed)
Please set up an MRI for left foot, Pt have physical therapy on Monday, Message is for [employer]

## 2013-03-22 ENCOUNTER — Ambulatory Visit: Payer: 59 | Admitting: Physical Therapy

## 2013-03-24 ENCOUNTER — Telehealth: Payer: Self-pay

## 2013-03-24 DIAGNOSIS — M79673 Pain in unspecified foot: Secondary | ICD-10-CM

## 2013-03-24 DIAGNOSIS — M25579 Pain in unspecified ankle and joints of unspecified foot: Secondary | ICD-10-CM

## 2013-03-24 NOTE — Telephone Encounter (Signed)
MRI order has been placed. Have her hold off on any further exercise at this time. It is not a good idea to repeat steroid therapy back-to-back to close, so unfortunately we will have to hold off on that at this time until we get the MRI results and go form there.

## 2013-03-24 NOTE — Telephone Encounter (Signed)
Patient is needing orders for an MRI for her foot as discussed with Thurmond Butts- PA. Please place orders as soon as possible so we can schedule her, thank you!   Patient also wanted to ask Thurmond Butts; Patient finished prednisone and wants to know what to do after? Should she take more?- if so refill. She also report that the pain is still there in heels and tightness in calf's; patient has quit working out but not better. Patient reports that when she was taking 3 prednisone it improved but once tapered down, pain returned and stopped improving. Please advise.   [phone]

## 2013-03-24 NOTE — Telephone Encounter (Signed)
Pain worsening and radiating to heel. prednisone was working but since finishing pain has returned and worsened, she is walking with a limp at this point. She would like to to be referred for the MRI that was discussed at her last appointment.  Please advise.

## 2013-03-25 ENCOUNTER — Other Ambulatory Visit: Payer: Self-pay | Admitting: Radiology

## 2013-03-25 DIAGNOSIS — M79672 Pain in left foot: Secondary | ICD-10-CM

## 2013-03-25 NOTE — Telephone Encounter (Signed)
Pt notified has MRI scheduled for Monday 02/23

## 2013-03-29 ENCOUNTER — Ambulatory Visit
Admission: RE | Admit: 2013-03-29 | Discharge: 2013-03-29 | Disposition: A | Discharge status: Home / Self Care | Payer: 59 | Source: Ambulatory Visit | Attending: Physician Assistant | Admitting: Physician Assistant

## 2013-03-29 DIAGNOSIS — M79672 Pain in left foot: Secondary | ICD-10-CM

## 2013-03-31 ENCOUNTER — Telehealth: Payer: Self-pay | Admitting: Family Medicine

## 2013-03-31 NOTE — Telephone Encounter (Signed)
Spoke with patient and notified can get heel lifts at [facility]. She wants to know if she can start working out/exercising again? She will not be running maybe elliptical? Please advise

## 2013-04-01 NOTE — Telephone Encounter (Signed)
I would like for her to start PT prior to doing this and rest during the day. If she jumps right back into working out she will just prolong her healing time for something that can take a while to begin with.

## 2013-04-04 NOTE — Telephone Encounter (Signed)
Pt.notified

## 2013-04-06 DIAGNOSIS — M722 Plantar fascial fibromatosis: Secondary | ICD-10-CM

## 2013-05-15 ENCOUNTER — Telehealth: Payer: Self-pay

## 2013-05-15 DIAGNOSIS — G4700 Insomnia, unspecified: Secondary | ICD-10-CM

## 2013-05-15 DIAGNOSIS — F988 Other specified behavioral and emotional disorders with onset usually occurring in childhood and adolescence: Secondary | ICD-10-CM

## 2013-05-15 MED ORDER — CLONAZEPAM 0.5 MG PO TABS: ORAL_TABLET | ORAL | Status: DC

## 2013-05-15 MED ORDER — AMPHETAMINE-DEXTROAMPHETAMINE 30 MG PO TABS: 30.0000 mg | ORAL_TABLET | Freq: Every day | ORAL | Status: DC

## 2013-05-15 NOTE — Telephone Encounter (Signed)
Medications refilled

## 2013-05-15 NOTE — Telephone Encounter (Signed)
PATIENT CALLED REQUESTING RX REFILL FOR ADDERALL AND Teresa Holden

## 2013-05-16 NOTE — Telephone Encounter (Signed)
Rx in pu drawer. Pt notified.

## 2013-06-22 ENCOUNTER — Ambulatory Visit: Payer: 59

## 2013-06-22 ENCOUNTER — Ambulatory Visit (INDEPENDENT_AMBULATORY_CARE_PROVIDER_SITE_OTHER): Payer: 59 | Admitting: Physician Assistant

## 2013-06-22 ENCOUNTER — Encounter: Payer: Self-pay | Admitting: Physician Assistant

## 2013-06-22 DIAGNOSIS — F988 Other specified behavioral and emotional disorders with onset usually occurring in childhood and adolescence: Secondary | ICD-10-CM

## 2013-06-22 DIAGNOSIS — R51 Headache: Secondary | ICD-10-CM

## 2013-06-22 DIAGNOSIS — G4700 Insomnia, unspecified: Secondary | ICD-10-CM

## 2013-06-22 DIAGNOSIS — M62838 Other muscle spasm: Secondary | ICD-10-CM

## 2013-06-22 MED ORDER — KETOROLAC TROMETHAMINE 60 MG/2ML IM SOLN
60.0000 mg | Freq: Once | INTRAMUSCULAR | Status: AC
  Administered 2013-06-22: 60 mg via INTRAMUSCULAR

## 2013-06-22 MED ORDER — TIZANIDINE HCL 4 MG PO CAPS: 4.0000 mg | ORAL_CAPSULE | Freq: Three times a day (TID) | ORAL | Status: DC

## 2013-06-22 MED ORDER — AMPHETAMINE-DEXTROAMPHETAMINE 30 MG PO TABS: 30.0000 mg | ORAL_TABLET | Freq: Every day | ORAL | Status: DC

## 2013-06-22 MED ORDER — CLONAZEPAM 0.5 MG PO TABS: ORAL_TABLET | ORAL | Status: DC
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
## 2013-07-28 ENCOUNTER — Other Ambulatory Visit: Payer: Self-pay | Admitting: Physician Assistant

## 2013-07-28 NOTE — Telephone Encounter (Signed)
Rx printed and signed.  Pt will need to establish with a new PCP before this runs out as Teresa Holden is no longer with our practice

## 2013-07-28 NOTE — Telephone Encounter (Signed)
Faxed and notified pt done and to est w/new PCP. Pt agreed.

## 2013-09-07 ENCOUNTER — Encounter: Payer: Self-pay | Admitting: Physician Assistant

## 2013-09-07 ENCOUNTER — Ambulatory Visit (INDEPENDENT_AMBULATORY_CARE_PROVIDER_SITE_OTHER): Payer: 59 | Admitting: Obstetrics & Gynecology

## 2013-09-07 DIAGNOSIS — L293 Anogenital pruritus, unspecified: Secondary | ICD-10-CM

## 2013-09-07 DIAGNOSIS — L292 Pruritus vulvae: Secondary | ICD-10-CM

## 2013-09-07 MED ORDER — CLOBETASOL PROPIONATE 0.05 % EX OINT: 1.0000 "application " | TOPICAL_OINTMENT | Freq: Two times a day (BID) | CUTANEOUS | Status: DC

## 2013-09-13 ENCOUNTER — Encounter: Payer: Self-pay | Admitting: Obstetrics & Gynecology

## 2013-09-13 NOTE — Progress Notes (Signed)
Subjective:     Patient ID: LOETTA CONNELLEY, female   DOB: Sep 30, 1985, 28 y.o.   MRN: [id number]  HPI 28 yo here with recurrent vulvar itching that she would like someone to look at.  D/W pt products she uses--toilet paper, pads/tampons, wipes, to see if any of these could be causative agents.  No hx of recent abnormal pap smears.  New pt to me today.  No vaginal dicharge.  No hx of recurrent yeast infections.  No new sexual partners.  No concerns about STDs.  Review of Systems  All other systems reviewed and are negative.      Objective:   Physical Exam  Constitutional: She appears well-developed and well-nourished.  Genitourinary: Vagina normal. No labial fusion. There is no rash, tenderness, lesion or injury on the right labia. There is no rash, tenderness, lesion or injury on the left labia.  No abnormal appearing skin changes.  Lymphadenopathy:       Right: No inguinal adenopathy present.       Left: No inguinal adenopathy present.  Neurological: She is alert.  Psychiatric: She has a normal mood and affect.       Assessment:     Vulvar itching     Plan:     Trial of clobetasol ointment 0.05% bid x 7-14 days.  If itching returns, may need skin biopsy.  Pt knows products to try and avoid.

## 2013-09-21 ENCOUNTER — Ambulatory Visit: Payer: 59

## 2013-09-22 ENCOUNTER — Ambulatory Visit (INDEPENDENT_AMBULATORY_CARE_PROVIDER_SITE_OTHER): Payer: 59 | Admitting: Family Medicine

## 2013-09-22 VITALS — BP 120/74 | HR 76 | Temp 98.3°F | Resp 18 | Ht 65.0 in | Wt 176.0 lb

## 2013-09-22 DIAGNOSIS — R0981 Nasal congestion: Secondary | ICD-10-CM

## 2013-09-22 DIAGNOSIS — G479 Sleep disorder, unspecified: Secondary | ICD-10-CM

## 2013-09-22 DIAGNOSIS — B349 Viral infection, unspecified: Secondary | ICD-10-CM

## 2013-09-22 DIAGNOSIS — F411 Generalized anxiety disorder: Secondary | ICD-10-CM

## 2013-09-22 DIAGNOSIS — J3489 Other specified disorders of nose and nasal sinuses: Secondary | ICD-10-CM

## 2013-09-22 DIAGNOSIS — B9789 Other viral agents as the cause of diseases classified elsewhere: Secondary | ICD-10-CM

## 2013-09-22 MED ORDER — CLONAZEPAM 0.5 MG PO TABS: 0.5000 mg | ORAL_TABLET | Freq: Two times a day (BID) | ORAL | Status: DC | PRN

## 2013-09-22 MED ORDER — FLUTICASONE PROPIONATE 50 MCG/ACT NA SUSP: 2.0000 | Freq: Every day | NASAL | Status: DC

## 2013-09-22 NOTE — Patient Instructions (Signed)
Take over-the-counter Claritin-D or Allegra-D daily  Take Tylenol or ibuprofen for fever or discomfort  Use the fluticasone spray 2 sprays each nostril twice daily for 3 days, then once daily  Take the clonazepam as previously directed for sleep disturbance and anxiety  Taper off clonazepam before any anticipated attempts for pregnancy  Return if not improving

## 2013-09-22 NOTE — Progress Notes (Signed)
Subjective: 28 year old lady, previously an employee here, who has been having symptoms over the last week. She's had otalgia, more on the left. She's had a headache and nasal congestion. She's developed a slight cough. She is sore throat. She's had a low-grade fever and is taken some Tylenol for that. She has a history of some sleep disturbance issues for which she is taking clonazepam 1 or a second one if needed. She is now Data processing manager at [employer]. She missed a half day yesterday.  Objective: No acute distress. TMs normal. Throat clear. Neck supple without nodes. Nose mildly congested. Chest clear. Heart regular without murmurs.  Assessment: Viral syndrome with otalgia and nasal congestion Anxiety disorder and sleep disorder  Plan: Filled the clonazepam again. Advise tapering off of it before any anticipated pregnancies.  Fluticasone spray OTC Claritin-D or Allegra-D  Return if worse

## 2013-10-06 ENCOUNTER — Other Ambulatory Visit: Payer: Self-pay | Admitting: *Deleted

## 2013-10-06 DIAGNOSIS — Z309 Encounter for contraceptive management, unspecified: Secondary | ICD-10-CM

## 2013-10-08 ENCOUNTER — Ambulatory Visit (INDEPENDENT_AMBULATORY_CARE_PROVIDER_SITE_OTHER): Payer: 59 | Admitting: Obstetrics & Gynecology

## 2013-10-08 VITALS — BP 122/80 | HR 80 | Resp 16 | Wt 175.8 lb

## 2013-10-08 DIAGNOSIS — Z30432 Encounter for removal of intrauterine contraceptive device: Secondary | ICD-10-CM

## 2013-10-08 DIAGNOSIS — Z309 Encounter for contraceptive management, unspecified: Secondary | ICD-10-CM

## 2013-10-10 NOTE — Progress Notes (Signed)
Patient ID: Teresa Holden, female   DOB: Nov 25, 1985, 28 y.o.   MRN: [id number]  28 y.o. G1P1 Married Caucasian female presents for [place] IUD removal.  She and spouse are going to try for a pregnancy together.  Each has a child from previous relationship.  She has not started a PNV but advised to do so as soon as possible.  Amenorrheic on OCPs.  IUD placed about two years ago by Dr. Helane Rima at [facility].  LMP:  No LMP recorded. Patient is not currently having periods (Reason: IUD).  Healthy female:WNWD NAD  Pelvic exam: Vulva: normal female genitalia Vagina:normal vagina, no discharge, exudate, lesion, or erythema Cervix:Non-tender, Negative CMT, no lesions or redness, no IUD string noted Uterus:normal shape, position and consistency   After patient read information booklet and all questions were answered, informed consent was obtained.    Procedure:  Speculum inserted into vagina. Cervix visualized and cleansed with betadine solution X 3.  IUD string noted noted.  String hook passed into endometrial cavity after application of single toothed tenaculum to anterior lip of cervix.  With several passes, IUD could not be removed.  Decided to proceed with bedside ultrasound to ensure IUD was indeed present in uterine cavity.  IUD noted into correct location and strings measures 1.5cm from os.  D/W pt findings.  As string is not close to os and I was not successful removing IUD with just IUD string hook, feel may need to use additional instruments for removal that increase risks of uterine perforation.  Do not have ultrasound techs in office today so I cannot perform IUD and procedure simultaneously.  Feel best to reschedule pt to a day when have ultrasonographer in office to help decrease any risks of complications, particularly uterine perforation.  Will proceed with IUD removal under ultrasound guidance.  Pt in agreement.  Procedure ended.  About 20 minutes spent trying to remove IUD,  performing ultrasound and then discussing options with pt.  A: Desirous of IUD removal.  Non visible strings.  P: Will attempt IUD removal again next week under ultrasound guidance.

## 2013-10-12 ENCOUNTER — Other Ambulatory Visit: Payer: Self-pay | Admitting: Obstetrics & Gynecology

## 2013-10-12 ENCOUNTER — Ambulatory Visit (INDEPENDENT_AMBULATORY_CARE_PROVIDER_SITE_OTHER): Payer: 59

## 2013-10-12 ENCOUNTER — Ambulatory Visit (INDEPENDENT_AMBULATORY_CARE_PROVIDER_SITE_OTHER): Payer: 59 | Admitting: Obstetrics & Gynecology

## 2013-10-12 DIAGNOSIS — Z538 Procedure and treatment not carried out for other reasons: Secondary | ICD-10-CM

## 2013-10-12 DIAGNOSIS — Z30431 Encounter for routine checking of intrauterine contraceptive device: Secondary | ICD-10-CM

## 2013-10-12 DIAGNOSIS — Z30432 Encounter for removal of intrauterine contraceptive device: Secondary | ICD-10-CM

## 2013-10-12 DIAGNOSIS — T8339XA Other mechanical complication of intrauterine contraceptive device, initial encounter: Secondary | ICD-10-CM

## 2013-10-12 DIAGNOSIS — Z975 Presence of (intrauterine) contraceptive device: Secondary | ICD-10-CM

## 2013-10-12 MED ORDER — KETOROLAC TROMETHAMINE 30 MG/ML IJ SOLN: 30.0000 mg | Freq: Once | INTRAMUSCULAR | Status: DC

## 2013-10-12 MED ORDER — KETOROLAC TROMETHAMINE 30 MG/ML IJ SOLN
30.0000 mg | Freq: Once | INTRAMUSCULAR | Status: AC
  Administered 2013-10-12: 30 mg via INTRAMUSCULAR

## 2013-10-12 NOTE — Progress Notes (Signed)
28 y.o. G1P1 Married Caucasian female presents for Mirena IUD removal.  String cannot be seen.  Attempt at office removal done on 10/08/13 without success.  I felt uncomfortable proceeding after several passes of string hook as I could not tell completely where the IUD was located and I was concerned about uterine perforation risks.    Pt here for second attempt under ultrasound guidance.  Pt took 800mg  Motrin po about an hour before ultrasound and 0.5mg  Xanax po x 1 about 1 hour before as well.  Ultrasound:  Uterus normal without masses.  IUD centrally located.  IUD string is about 1.5 cm from cervical os.  Ovaries normal.  No free fluid in PCDS.  After ultrasound location of IUD procedure was initiated.  Speculum inserted into vagina. Cervix visualized and cleansed with betadine solution X 3. IUD string noted noted. String hook passed into endometrial cavity after application of single toothed tenaculum to anterior lip of cervix. IUD could be seen.  Attempt made to pass string hook underneath IUD without success.  Several attempts were made to obtain string.  Pt ultimately asked for procedure to end due to discomfort.  She did tolerate all of this very well.    She was counseled about IUD removal in the OR.  Procedure discussed.  Pt thought about it and decided to proceed.  Risks and benefits were discussed with pt for hysteroscopic identification and removal of IUD.  Procedure discussed with patient.  Recovery and pain management discussed.  Risks discussed including but not limited to bleeding, rare risk of transfusion, infection, 1% risk of uterine perforation with risks of fluid deficit causing cardiac arrythmia, cerebral swelling and/or need to stop procedure early.  Fluid emboli and rare risk of death discussed.  DVT/PE, rare risk of risk of bowel/bladder/ureteral/vascular injury.  All questions answered.  Pt desirous of proceeding.  Assessment:  IUD, inable to remove in office  Plan:   Hysteroscopic removal in OR planned.  Orders placed.  Toradol 10mg  po x 6 hours prn up to four tablets daily and Zofran 4mg  ODT q 4 hours prn nausea given.  Pt declines any other pain medication.  Toradol 30mg  IM x 1 given in office after procedure.

## 2013-10-13 ENCOUNTER — Telehealth: Payer: Self-pay | Admitting: *Deleted

## 2013-10-13 ENCOUNTER — Encounter (HOSPITAL_COMMUNITY): Payer: Self-pay | Admitting: *Deleted

## 2013-10-13 NOTE — Telephone Encounter (Signed)
Patient contacted me personally (on 10-12-13 in the evening) requesting to proceed with scheduling removal of IUD. Case request sent for 10-15-13 at 1145 per previous discussion with Dr Sabra Heck. Patient notified case scheduled as requested and surgery instructions reviewed with patient and instruction sheet given. She has already talked with PAT nurse at hospital. Brief review of surgery and advised Dr Sabra Heck will speak with her directly tomorrow. (Patient works in office). Precert completed per [place] in insurance and patient agreeable to [facility] estimate.    Routing to provider for final review. Patient agreeable to disposition. Will close encounter

## 2013-10-14 MED ORDER — ONDANSETRON 4 MG PO TBDP: 4.0000 mg | ORAL_TABLET | Freq: Three times a day (TID) | ORAL | Status: DC | PRN

## 2013-10-14 MED ORDER — METRONIDAZOLE IN NACL 5-0.79 MG/ML-% IV SOLN
500.0000 mg | Freq: Once | INTRAVENOUS | Status: AC
  Administered 2013-10-15: 500 mg via INTRAVENOUS
  Filled 2013-10-14: qty 100

## 2013-10-14 MED ORDER — KETOROLAC TROMETHAMINE 10 MG PO TABS
10.0000 mg | ORAL_TABLET | Freq: Four times a day (QID) | ORAL | Status: DC | PRN
Start: 2013-10-14 — End: 2014-03-02

## 2013-10-15 ENCOUNTER — Encounter (HOSPITAL_COMMUNITY): Payer: Self-pay | Admitting: *Deleted

## 2013-10-15 ENCOUNTER — Encounter (HOSPITAL_COMMUNITY): Payer: Self-pay | Admitting: Pharmacy Technician

## 2013-10-15 ENCOUNTER — Encounter (HOSPITAL_COMMUNITY): Payer: 59

## 2013-10-15 ENCOUNTER — Ambulatory Visit (HOSPITAL_COMMUNITY)
Admission: RE | Admit: 2013-10-15 | Discharge: 2013-10-15 | Disposition: A | Discharge status: Home / Self Care | Payer: 59 | Source: Ambulatory Visit | Attending: Obstetrics & Gynecology | Admitting: Obstetrics & Gynecology

## 2013-10-15 ENCOUNTER — Encounter (HOSPITAL_COMMUNITY)
Admission: RE | Disposition: A | Discharge status: Home / Self Care | Payer: Self-pay | Source: Ambulatory Visit | Attending: Obstetrics & Gynecology

## 2013-10-15 ENCOUNTER — Encounter: Payer: Self-pay | Admitting: Obstetrics & Gynecology

## 2013-10-15 ENCOUNTER — Ambulatory Visit (HOSPITAL_COMMUNITY): Payer: 59

## 2013-10-15 DIAGNOSIS — T8339XA Other mechanical complication of intrauterine contraceptive device, initial encounter: Secondary | ICD-10-CM

## 2013-10-15 DIAGNOSIS — Z880 Allergy status to penicillin: Secondary | ICD-10-CM | POA: Diagnosis not present

## 2013-10-15 DIAGNOSIS — Z30432 Encounter for removal of intrauterine contraceptive device: Secondary | ICD-10-CM | POA: Diagnosis present

## 2013-10-15 DIAGNOSIS — M3500 Sicca syndrome, unspecified: Secondary | ICD-10-CM | POA: Insufficient documentation

## 2013-10-15 HISTORY — PX: HYSTEROSCOPY: SHX211

## 2013-10-15 HISTORY — DX: Other specified postprocedural states: R11.2

## 2013-10-15 HISTORY — DX: Family history of other specified conditions: Z84.89

## 2013-10-15 HISTORY — DX: Other specified postprocedural states: Z98.890

## 2013-10-15 LAB — CBC
HCT: 41.3 % (ref 36.0–46.0)
Hemoglobin: 13.8 g/dL (ref 12.0–15.0)
MCH: 28 pg (ref 26.0–34.0)
MCHC: 33.4 g/dL (ref 30.0–36.0)
MCV: 83.8 fL (ref 78.0–100.0)
Platelets: 250 10*3/uL (ref 150–400)
RBC: 4.93 MIL/uL (ref 3.87–5.11)
RDW: 12.9 % (ref 11.5–15.5)
WBC: 7.1 10*3/uL (ref 4.0–10.5)

## 2013-10-15 LAB — PREGNANCY, URINE: Preg Test, Ur: NEGATIVE

## 2013-10-15 SURGERY — HYSTEROSCOPY
Anesthesia: General | Site: Vagina

## 2013-10-15 MED ORDER — MIDAZOLAM HCL 2 MG/2ML IJ SOLN
INTRAMUSCULAR | Status: DC | PRN
  Administered 2013-10-15 (×2): 1 mg via INTRAVENOUS

## 2013-10-15 MED ORDER — MIDAZOLAM HCL 2 MG/2ML IJ SOLN
INTRAMUSCULAR | Status: AC
  Filled 2013-10-15: qty 2

## 2013-10-15 MED ORDER — ONDANSETRON HCL 4 MG/2ML IJ SOLN
INTRAMUSCULAR | Status: DC | PRN
  Administered 2013-10-15: 4 mg via INTRAVENOUS

## 2013-10-15 MED ORDER — SODIUM CHLORIDE 0.9 % IR SOLN
Status: DC | PRN
  Administered 2013-10-15: 3000 mL

## 2013-10-15 MED ORDER — MEPERIDINE HCL 25 MG/ML IJ SOLN: 6.2500 mg | INTRAMUSCULAR | Status: DC | PRN

## 2013-10-15 MED ORDER — LIDOCAINE HCL (CARDIAC) 20 MG/ML IV SOLN
INTRAVENOUS | Status: AC
Start: 2013-10-15 — End: 2013-10-15
  Filled 2013-10-15: qty 5

## 2013-10-15 MED ORDER — SCOPOLAMINE 1 MG/3DAYS TD PT72
MEDICATED_PATCH | TRANSDERMAL | Status: AC
  Administered 2013-10-15: 1.5 mg via TRANSDERMAL
  Filled 2013-10-15: qty 1

## 2013-10-15 MED ORDER — PROPOFOL 10 MG/ML IV EMUL
INTRAVENOUS | Status: AC
  Filled 2013-10-15: qty 20

## 2013-10-15 MED ORDER — SILVER NITRATE-POT NITRATE 75-25 % EX MISC
CUTANEOUS | Status: AC
  Filled 2013-10-15: qty 1

## 2013-10-15 MED ORDER — LACTATED RINGERS IV SOLN
INTRAVENOUS | Status: DC
  Administered 2013-10-15 (×2): via INTRAVENOUS

## 2013-10-15 MED ORDER — DEXAMETHASONE SODIUM PHOSPHATE 4 MG/ML IJ SOLN
INTRAMUSCULAR | Status: AC
  Filled 2013-10-15: qty 1

## 2013-10-15 MED ORDER — KETOROLAC TROMETHAMINE 30 MG/ML IJ SOLN: 15.0000 mg | Freq: Once | INTRAMUSCULAR | Status: DC | PRN

## 2013-10-15 MED ORDER — SCOPOLAMINE 1 MG/3DAYS TD PT72
1.0000 | MEDICATED_PATCH | Freq: Once | TRANSDERMAL | Status: DC
Start: 2013-10-15 — End: 2013-10-15
  Administered 2013-10-15: 1.5 mg via TRANSDERMAL

## 2013-10-15 MED ORDER — LIDOCAINE HCL (CARDIAC) 20 MG/ML IV SOLN
INTRAVENOUS | Status: DC | PRN
  Administered 2013-10-15: 50 mg via INTRAVENOUS

## 2013-10-15 MED ORDER — LIDOCAINE HCL 1 % IJ SOLN
INTRAMUSCULAR | Status: AC
  Filled 2013-10-15: qty 20

## 2013-10-15 MED ORDER — ONDANSETRON HCL 4 MG/2ML IJ SOLN
INTRAMUSCULAR | Status: AC
  Filled 2013-10-15: qty 2

## 2013-10-15 MED ORDER — LIDOCAINE HCL 1 % IJ SOLN
INTRAMUSCULAR | Status: DC | PRN
  Administered 2013-10-15: 9 mL

## 2013-10-15 MED ORDER — PROPOFOL 10 MG/ML IV BOLUS
INTRAVENOUS | Status: DC | PRN
  Administered 2013-10-15: 180 mg via INTRAVENOUS

## 2013-10-15 MED ORDER — FENTANYL CITRATE 0.05 MG/ML IJ SOLN
INTRAMUSCULAR | Status: DC | PRN
  Administered 2013-10-15 (×2): 50 ug via INTRAVENOUS

## 2013-10-15 MED ORDER — DEXAMETHASONE SODIUM PHOSPHATE 10 MG/ML IJ SOLN
INTRAMUSCULAR | Status: DC | PRN
  Administered 2013-10-15: 4 mg via INTRAVENOUS

## 2013-10-15 MED ORDER — FENTANYL CITRATE 0.05 MG/ML IJ SOLN: 25.0000 ug | INTRAMUSCULAR | Status: DC | PRN

## 2013-10-15 MED ORDER — FENTANYL CITRATE 0.05 MG/ML IJ SOLN
INTRAMUSCULAR | Status: AC
  Filled 2013-10-15: qty 2

## 2013-10-15 MED ORDER — KETOROLAC TROMETHAMINE 30 MG/ML IJ SOLN
INTRAMUSCULAR | Status: AC
Start: 2013-10-15 — End: 2013-10-15
  Filled 2013-10-15: qty 2

## 2013-10-15 MED ORDER — KETOROLAC TROMETHAMINE 30 MG/ML IJ SOLN
INTRAMUSCULAR | Status: DC | PRN
  Administered 2013-10-15: 30 mg via INTRAVENOUS
  Administered 2013-10-15: 30 mg via INTRAMUSCULAR

## 2013-10-15 MED ORDER — ONDANSETRON HCL 4 MG/2ML IJ SOLN
4.0000 mg | Freq: Once | INTRAMUSCULAR | Status: DC | PRN
Start: 2013-10-15 — End: 2013-10-15

## 2013-10-15 SURGICAL SUPPLY — 22 items
CATH ROBINSON RED A/P 16FR (CATHETERS) ×2 IMPLANT
CLOTH BEACON ORANGE TIMEOUT ST (SAFETY) ×2 IMPLANT
CONT PATH 16OZ SNAP LID 3702 (MISCELLANEOUS) ×2 IMPLANT
CONTAINER PREFILL 10% NBF 60ML (FORM) ×4 IMPLANT
DRAPE HYSTEROSCOPY (DRAPE) ×2 IMPLANT
ELECT REM PT RETURN 9FT ADLT (ELECTROSURGICAL)
ELECTRODE REM PT RTRN 9FT ADLT (ELECTROSURGICAL) IMPLANT
GLOVE BIOGEL PI IND STRL 7.0 (GLOVE) ×1 IMPLANT
GLOVE BIOGEL PI INDICATOR 7.0 (GLOVE) ×1
GLOVE ECLIPSE 6.5 STRL STRAW (GLOVE) ×4 IMPLANT
GOWN STRL REUS W/TWL LRG LVL3 (GOWN DISPOSABLE) ×5 IMPLANT
LOOP ANGLED CUTTING 22FR (CUTTING LOOP) IMPLANT
NDL SPNL 22GX3.5 QUINCKE BK (NEEDLE) ×1 IMPLANT
NEEDLE SPNL 22GX3.5 QUINCKE BK (NEEDLE) ×2 IMPLANT
PACK VAGINAL MINOR WOMEN LF (CUSTOM PROCEDURE TRAY) ×2 IMPLANT
PAD OB MATERNITY 4.3X12.25 (PERSONAL CARE ITEMS) ×2 IMPLANT
SET TUBING HYSTEROSCOPY 2 NDL (TUBING) ×1 IMPLANT
SYRINGE CONTROL L 12CC (SYRINGE) ×2 IMPLANT
SYRINGE CONTROL LL 12CC (SYRINGE) ×1 IMPLANT
TOWEL OR 17X24 6PK STRL BLUE (TOWEL DISPOSABLE) ×4 IMPLANT
TUBE HYSTEROSCOPY W Y-CONNECT (TUBING) ×1 IMPLANT
WATER STERILE IRR 1000ML POUR (IV SOLUTION) ×2 IMPLANT

## 2013-10-15 NOTE — Discharge Instructions (Signed)
Post-surgical Instructions, Outpatient Surgery  You may expect to feel dizzy, weak, and drowsy for as long as 24 hours after receiving the medicine that made you sleep (anesthetic). For the first 24 hours after your surgery:    Do not drive a car, ride a bicycle, participate in physical activities, or take public transportation until you are done taking narcotic pain medicines or as directed by Dr. Sabra Heck.   Do not drink alcohol or take tranquilizers.   Do not take medicine that has not been prescribed by your physicians.   Do not sign important papers or make important decisions while on narcotic pain medicines.   Have a responsible person with you.   CARE OF INCISION  You may shower on the first day after your surgery.  Do not sit in a tub bath for 2-3 week.  Avoid heavy lifting (more than 10 pounds/4.5 kilograms), pushing, or pulling.   Avoid activities that may risk injury to your incisions.   PAIN MANAGEMENT  Toradol 10mg  orally every six hours as needed.  Do not take Motrin in addition to the Toradol.  You can alternate the Toradol with Tylenol if needed.  DO'S AND DON'T'S  Do not take a tub bath for one week.  You may shower on the first day after your surgery  Do not do any heavy lifting for one to two weeks.  This increases the chance of bleeding.  Do move around as you feel able.  Stairs are fine.  You may begin to exercise again as you feel able.  Do not lift any weights for two weeks.  Do not put anything in the vagina for one weeks--no tampons, intercourse, or douching.    REGULAR MEDIATIONS/VITAMINS:  You may restart all of your regular medications as prescribed.  You may restart all of your vitamins as you normally take them.    PLEASE CALL OR SEEK MEDICAL CARE IF:  You have persistent nausea and vomiting.   You have trouble eating or drinking.   You have an oral temperature above 100.5.   You have constipation that is not helped by adjusting diet or  increasing fluid intake. Pain medicines are a common cause of constipation.   You have heavy vaginal bleeding

## 2013-10-15 NOTE — Anesthesia Postprocedure Evaluation (Signed)
Anesthesia Post Note  Patient: Teresa Holden  Procedure(s) Performed: Procedure(s) (LRB): HYSTEROSCOPY with removal of IUD (N/A)  Anesthesia type: General  Patient location: PACU  Post pain: Pain level controlled  Post assessment: Post-op Vital signs reviewed  Last Vitals:  Filed Vitals:   10/15/13 1315  BP: 121/67  Pulse: 76  Temp: 36.6 C  Resp: 15    Post vital signs: Reviewed  Level of consciousness: sedated  Complications: No apparent anesthesia complications

## 2013-10-15 NOTE — Op Note (Signed)
10/15/2013  12:17 PM  PATIENT:  Teresa Holden  28 y.o. female G1P1 MWF with IUD that could not be removed in the office despite multiple attempts, intolerance of attempt at office procedure again, desired fertility for future pregnancy.    PRE-OPERATIVE DIAGNOSIS:  failed removal of Intra Uterine Device, desired future pregnancy  POST-OPERATIVE DIAGNOSIS:  failed removal of Intra Uterine Device  PROCEDURE:  Procedure(s): HYSTEROSCOPY with removal of IUD  SURGEON:  MILLER, MARY SUZANNE  ASSISTANTS: OR staff   ANESTHESIA:   general, MAC  ESTIMATED BLOOD LOSS: 5cc  BLOOD ADMINISTERED:none   FLUIDS: 1000ccLR  UOP: 50cc  SPECIMEN:  none  DISPOSITION OF SPECIMEN:  N/A  FINDINGS: IUD located within endometrial cavity.  String curled at base of IUD.  DESCRIPTION OF OPERATION: Patient was taken to the operating room.  She is placed in the supine position. SCDs were on her lower extremities and functioning properly. General anesthesia with an LMA was administered without difficulty. Dr. Jillyn Hidden oversaw case.  Legs were then placed in the [place] in the low lithotomy position. The legs were lifted to the high lithotomy position and the Betadine prep was used on the inner thighs perineum and vagina x3. Patient was draped in a normal standard fashion. An in and out catheterization with a red rubber Foley catheter was performed. Approximately 50 cc of clear urine was noted. A bivalve speculum was placed the vagina. The anterior lip of the cervix was grasped with single-tooth tenaculum.  A paracervical block of 1% lidocaine mixed one-to-one with epinephrine (1:100,000 units).  9 cc was used total. The cervix is dilated up to #21 [facility] dilators. The endometrial cavity sounded to 8 cm.   A 2.9 millimeter diagnostic hysteroscope was obtained. NS was used as a hysteroscopic fluid. The hysteroscope was advanced through the endocervical canal into the endometrial cavity. The string and  IUD were seen.  Using a hysteroscopic grasper, the IUD string was grasped.  After the third pull, the IUD was removed with string intact.  IUD seen by scrub and circulating RN before discarding IUD.  The endometrial cavity was visualized in its entirety.  Photodocumentation was made.  At this point no other procedure was ended. The hysteroscope was removed. The fluid deficit was 100 cc. The tenaculum was removed from the anterior lip of the cervix. Minimal bleeding was noted.  The speculum was returned vagina. The prep was cleansed of the patient's skin. The legs are positioned back in the supine position. Sponge, lap, needle, initially counts were correct x2. Patient was taken to recovery in stable condition.  COUNTS:  YES  PLAN OF CARE: Transfer to PACU

## 2013-10-15 NOTE — Anesthesia Procedure Notes (Signed)
Procedure Name: LMA Insertion Date/Time: 10/15/2013 11:56 AM Performed by: Lyman Speller Pre-anesthesia Checklist: Patient identified, Patient being monitored, Emergency Drugs available and Suction available Patient Re-evaluated:Patient Re-evaluated prior to inductionOxygen Delivery Method: Circle system utilized Preoxygenation: Pre-oxygenation with 100% oxygen Intubation Type: IV induction Ventilation: Mask ventilation without difficulty LMA: LMA inserted LMA Size: 4.0 Number of attempts: 1 Dental Injury: Teeth and Oropharynx as per pre-operative assessment

## 2013-10-15 NOTE — Transfer of Care (Signed)
Immediate Anesthesia Transfer of Care Note  Patient: Teresa Holden  Procedure(s) Performed: Procedure(s): HYSTEROSCOPY with removal of IUD (N/A)  Patient Location: PACU  Anesthesia Type:General  Level of Consciousness: awake, alert  and oriented  Airway & Oxygen Therapy: Patient Spontanous Breathing and Patient connected to nasal cannula oxygen  Post-op Assessment: Report given to PACU RN and Post -op Vital signs reviewed and stable  Post vital signs: Reviewed and stable  Complications: No apparent anesthesia complications

## 2013-10-15 NOTE — Anesthesia Preprocedure Evaluation (Signed)
Anesthesia Evaluation  Patient identified by MRN, date of birth, ID band Patient awake    Reviewed: Allergy & Precautions, H&P , NPO status , Patient's Chart, lab work & pertinent test results  Airway Mallampati: I TM Distance: >3 FB Neck ROM: full    Dental no notable dental hx. (+) Teeth Intact   Pulmonary neg pulmonary ROS,    Pulmonary exam normal       Cardiovascular negative cardio ROS  Rhythm:regular     Neuro/Psych    GI/Hepatic negative GI ROS, Neg liver ROS,   Endo/Other  negative endocrine ROS  Renal/GU negative Renal ROS     Musculoskeletal   Abdominal Normal abdominal exam  (+)   Peds  Hematology negative hematology ROS (+)   Anesthesia Other Findings   Reproductive/Obstetrics negative OB ROS                           Anesthesia Physical Anesthesia Plan  ASA: II  Anesthesia Plan: General   Post-op Pain Management:    Induction: Intravenous  Airway Management Planned: LMA  Additional Equipment:   Intra-op Plan:   Post-operative Plan:   Informed Consent: I have reviewed the patients History and Physical, chart, labs and discussed the procedure including the risks, benefits and alternatives for the proposed anesthesia with the patient or authorized representative who has indicated his/her understanding and acceptance.     Plan Discussed with: CRNA and Surgeon  Anesthesia Plan Comments:         Anesthesia Quick Evaluation

## 2013-10-15 NOTE — H&P (Signed)
Teresa Holden is an 28 y.o. female  G1P1 MWF here for hysteroscopic Mirena IUD removal due to inability to remove IUD in office.  Two attempts have been made to do this in the office.  On was with ultrasound guidance.  Due to pt discomfort, procedure was ended.  Pt and I discussed proceeding with hysteroscopy with removal in the OR.  Risks and benefits have been explained.  Pt is here and ready to proceed.  Pertinent Gynecological History: Menses: amenorrhea Bleeding: none Contraception: IUD DES exposure: denies Blood transfusions: none Sexually transmitted diseases: no past history Previous GYN Procedures: none  Last mammogram: n/a Date: n/a Last pap: normal Date: 2013 OB History: G1, P1   Menstrual History: No LMP recorded. Patient is not currently having periods (Reason: IUD).    Past Medical History  Diagnosis Date  . Sjogren's syndrome   . Migraines   . Anxiety   . ADD (attention deficit disorder with hyperactivity)   . PONV (postoperative nausea and vomiting)   . Family history of anesthesia complication     N&V    Past Surgical History  Procedure Laterality Date  . Breast surgery    . Wisdom tooth extraction Bilateral 2008    Family History  Problem Relation Age of Onset  . Sarcoidosis Mother   . Heart disease Maternal Grandfather   . Diabetes Paternal Grandfather   . Heart disease Paternal Grandfather     Social History:  reports that she has never smoked. She does not have any smokeless tobacco history on file. She reports that she does not drink alcohol or use illicit drugs.  Allergies:  Allergies  Allergen Reactions  . Penicillins Rash    Prescriptions prior to admission  Medication Sig Dispense Refill  . Prenatal Vit-Fe Fumarate-FA (PRENATAL MULTIVITAMIN) TABS tablet Take 1 tablet by mouth daily at 12 noon.      Marland Kitchen albuterol (PROVENTIL HFA;VENTOLIN HFA) 108 (90 BASE) MCG/ACT inhaler Inhale 2 puffs into the lungs every 4 (four) hours as needed for  wheezing or shortness of breath.  1 Inhaler  2  . ketorolac (TORADOL) 10 MG tablet Take 1 tablet (10 mg total) by mouth every 6 (six) hours as needed.  15 tablet  0  . levonorgestrel (MIRENA) 20 MCG/24HR IUD 1 each by Intrauterine route once.      . ondansetron (ZOFRAN ODT) 4 MG disintegrating tablet Take 1 tablet (4 mg total) by mouth every 8 (eight) hours as needed for nausea or vomiting.  15 tablet  0    Review of Systems  All other systems reviewed and are negative.   Blood pressure 121/49, pulse 79, temperature 98.4 F (36.9 C), temperature source Oral, resp. rate 18, height 5\' 3"  (1.6 m), weight 175 lb (79.379 kg), SpO2 99.00%. Physical Exam  Constitutional: She is oriented to person, place, and time. She appears well-developed and well-nourished.  Cardiovascular: Normal rate and regular rhythm.   Respiratory: Effort normal and breath sounds normal.  GI: Soft. Bowel sounds are normal.  Neurological: She is alert and oriented to person, place, and time.  Skin: Skin is warm and dry.  Psychiatric: She has a normal mood and affect.    Results for orders placed during the hospital encounter of 10/15/13 (from the past 24 hour(s))  PREGNANCY, URINE     Status: None   Collection Time    10/15/13 10:15 AM      Result Value Ref Range   Preg Test, Ur NEGATIVE  NEGATIVE  CBC     Status: None   Collection Time    10/15/13 10:20 AM      Result Value Ref Range   WBC 7.1  4.0 - 10.5 K/uL   RBC 4.93  3.87 - 5.11 MIL/uL   Hemoglobin 13.8  12.0 - 15.0 g/dL   HCT 41.3  36.0 - 46.0 %   MCV 83.8  78.0 - 100.0 fL   MCH 28.0  26.0 - 34.0 pg   MCHC 33.4  30.0 - 36.0 g/dL   RDW 12.9  11.5 - 15.5 %   Platelets 250  150 - 400 K/uL    No results found.  Assessment/Plan: 28 yo G1P1 MWF with IUD that I have been unable to remove in the office here for hysteroscopic assisted removal.  No family is with pt right now.  All questions answered.  She is ready to proceed.  Hale Bogus  [facility] 10/15/2013, 11:36 AM

## 2013-10-18 ENCOUNTER — Encounter (HOSPITAL_COMMUNITY): Payer: Self-pay | Admitting: Obstetrics & Gynecology

## 2013-10-29 ENCOUNTER — Ambulatory Visit: Payer: 59 | Admitting: Obstetrics & Gynecology

## 2013-11-09 ENCOUNTER — Ambulatory Visit: Payer: 59 | Admitting: Obstetrics & Gynecology

## 2013-11-18 ENCOUNTER — Other Ambulatory Visit: Payer: Self-pay | Admitting: Obstetrics & Gynecology

## 2013-11-18 ENCOUNTER — Other Ambulatory Visit (INDEPENDENT_AMBULATORY_CARE_PROVIDER_SITE_OTHER): Payer: 59

## 2013-11-18 DIAGNOSIS — N912 Amenorrhea, unspecified: Secondary | ICD-10-CM

## 2013-11-19 LAB — HCG, QUANTITATIVE, PREGNANCY: hCG, Beta Chain, Quant, S: <2 m[IU]/mL

## 2013-12-08 ENCOUNTER — Telehealth: Payer: Self-pay | Admitting: *Deleted

## 2013-12-08 MED ORDER — BENZONATATE 100 MG PO CAPS: 100.0000 mg | ORAL_CAPSULE | Freq: Three times a day (TID) | ORAL | Status: DC | PRN

## 2013-12-08 MED ORDER — AZITHROMYCIN 250 MG PO TABS: ORAL_TABLET | ORAL | Status: DC

## 2013-12-08 NOTE — Telephone Encounter (Signed)
Patent checking on medication request. States pharmacy does not have it yet. She is aware Dr Sabra Heck is not in office. Will recheck pharmacy later this afternoon.

## 2013-12-08 NOTE — Telephone Encounter (Signed)
rx's done after speaking with pt regarding yellow sputum production, low grade fever, cough, and difficulty resting due to post nasal drainage.  Pt will also start mucinex.

## 2013-12-08 NOTE — Telephone Encounter (Signed)
Rx needed at [facility]. Call to [place] at [facility] [place] for Z-pak and Tussalon pearls called in (as directed by Dr Sabra Heck) and call to [place] at [place] and original RX canceled.

## 2013-12-15 ENCOUNTER — Other Ambulatory Visit: Payer: 59

## 2013-12-15 ENCOUNTER — Other Ambulatory Visit: Payer: Self-pay | Admitting: Obstetrics & Gynecology

## 2013-12-15 DIAGNOSIS — M79673 Pain in unspecified foot: Secondary | ICD-10-CM

## 2013-12-15 DIAGNOSIS — N938 Other specified abnormal uterine and vaginal bleeding: Secondary | ICD-10-CM

## 2013-12-15 DIAGNOSIS — M25579 Pain in unspecified ankle and joints of unspecified foot: Secondary | ICD-10-CM

## 2013-12-15 LAB — TSH: TSH: 0.595 u[IU]/mL (ref 0.350–4.500)

## 2013-12-15 NOTE — Progress Notes (Signed)
Orders placed for labs due to DUB.  Pt aware.  Will have done today.

## 2013-12-16 LAB — PROLACTIN: Prolactin: 6.9 ng/mL

## 2013-12-16 LAB — FOLLICLE STIMULATING HORMONE: FSH: 5.1 m[IU]/mL

## 2014-01-31 ENCOUNTER — Other Ambulatory Visit: Payer: Self-pay | Admitting: Obstetrics & Gynecology

## 2014-01-31 MED ORDER — AZITHROMYCIN 250 MG PO TABS: ORAL_TABLET | ORAL | Status: DC

## 2014-01-31 NOTE — Progress Notes (Signed)
Pt having cough, nasal congestion, and chest tightness.  Lungs clear.  Has been symptoms for approximately one week.  Low grade temp yesterday per pt.  Rx for Z pak to [facility].

## 2014-02-01 ENCOUNTER — Other Ambulatory Visit: Payer: Self-pay | Admitting: *Deleted

## 2014-02-01 ENCOUNTER — Other Ambulatory Visit (INDEPENDENT_AMBULATORY_CARE_PROVIDER_SITE_OTHER): Payer: 59

## 2014-02-01 DIAGNOSIS — Z0000 Encounter for general adult medical examination without abnormal findings: Secondary | ICD-10-CM

## 2014-02-01 DIAGNOSIS — N926 Irregular menstruation, unspecified: Secondary | ICD-10-CM

## 2014-02-01 LAB — PROGESTERONE: Progesterone: 9.5 ng/mL

## 2014-02-03 ENCOUNTER — Telehealth: Payer: Self-pay | Admitting: *Deleted

## 2014-02-03 NOTE — Telephone Encounter (Addendum)
Patient checking on lab results. She is aware results are pending.

## 2014-02-07 NOTE — Telephone Encounter (Signed)
Message sent through [place].  Encounter closed.

## 2014-03-02 ENCOUNTER — Ambulatory Visit (INDEPENDENT_AMBULATORY_CARE_PROVIDER_SITE_OTHER): Payer: 59 | Admitting: Certified Nurse Midwife

## 2014-03-02 ENCOUNTER — Encounter: Payer: Self-pay | Admitting: Certified Nurse Midwife

## 2014-03-02 VITALS — BP 108/76 | HR 88 | Temp 98.5°F | Ht 63.0 in | Wt 182.0 lb

## 2014-03-02 DIAGNOSIS — N390 Urinary tract infection, site not specified: Secondary | ICD-10-CM

## 2014-03-02 DIAGNOSIS — N342 Other urethritis: Secondary | ICD-10-CM

## 2014-03-02 DIAGNOSIS — R300 Dysuria: Secondary | ICD-10-CM

## 2014-03-02 LAB — POCT URINALYSIS DIPSTICK
Bilirubin, UA: NEGATIVE
Glucose, UA: NEGATIVE
Ketones, UA: NEGATIVE
Nitrite, UA: NEGATIVE
Protein, UA: NEGATIVE
Urobilinogen, UA: NEGATIVE
pH, UA: 5

## 2014-03-02 MED ORDER — NITROFURANTOIN MONOHYD MACRO 100 MG PO CAPS: 100.0000 mg | ORAL_CAPSULE | Freq: Two times a day (BID) | ORAL | Status: DC

## 2014-03-02 MED ORDER — PHENAZOPYRIDINE HCL 200 MG PO TABS: 200.0000 mg | ORAL_TABLET | Freq: Three times a day (TID) | ORAL | Status: DC | PRN

## 2014-03-02 NOTE — Progress Notes (Signed)
29 y.o.married white female g1p1001here with complaint of UTI, with onset  on 2 days. Patient complaining of urinary frequency, pressure and no pain with urination. Patient denies fever, chills, nausea or back pain. No new personal products. No thong use. Denies any vaginal symptoms  Contraception is none. Trying for pregnancy. History of kidney stones in the past, no symptoms of today other than pressure and "shooting pain".  Water intake good, minimal soda or coffee. No other health issue today.   O: Healthy female WDWN Affect: Normal, orientation x 3 Skin : warm and dry CVAT: negative bilateral Abdomen: positive for suprapubic tenderness  Pelvic exam: External genital area: normal, no lesions Bladder,Urethra, tender to palpation, Urethral meatus: tender slight increase in pink Vagina: normal vaginal discharge, normal appearance    Cervix: normal, non tender, not visualized Uterus:normal,non tender Adnexa: normal non tender, no fullness or masses   A: UTI  P: Reviewed findings of UTI and need to increase water intake with medication use. Patient aware of symptoms with kidney stones and will need advise if change or seek ER. Will advise of status in am. KA:[id number] see order Rx Pyridium see order IOM:BTDHR micro, culture Reviewed warning signs and symptoms of UTI Encouraged to limit soda, tea, and coffee RV 2 week if TOC needed for positive culture  RV prn

## 2014-03-02 NOTE — Patient Instructions (Signed)

## 2014-03-03 ENCOUNTER — Other Ambulatory Visit: Payer: Self-pay | Admitting: Obstetrics & Gynecology

## 2014-03-03 LAB — URINALYSIS, MICROSCOPIC ONLY
Bacteria, UA: NONE SEEN
Casts: NONE SEEN
Crystals: NONE SEEN

## 2014-03-03 LAB — URINE CULTURE
Colony Count: NO GROWTH
Organism ID, Bacteria: NO GROWTH

## 2014-03-03 MED ORDER — SULFAMETHOXAZOLE-TRIMETHOPRIM 800-160 MG PO TABS: 1.0000 | ORAL_TABLET | Freq: Two times a day (BID) | ORAL | Status: DC

## 2014-03-03 NOTE — Progress Notes (Signed)
Pt started on abx yesterday for possible UTI.  No symptom improvement.  Does have hx of nephrolithiasis.  Culture still pending.  May need urology referral if symptoms do not improve.

## 2014-03-03 NOTE — Progress Notes (Signed)
Reviewed personally.  M. Suzanne Miller, MD.  

## 2014-03-22 ENCOUNTER — Other Ambulatory Visit: Payer: Self-pay | Admitting: Obstetrics & Gynecology

## 2014-03-22 MED ORDER — LETROZOLE 2.5 MG PO TABS: ORAL_TABLET | ORAL | Status: DC

## 2014-03-22 NOTE — Progress Notes (Signed)
Pt ready to start Femara with next cycle.  Rx to pharmacy on file.  Femara 2.5mg  tabs (3 tabs=7.5mg ) daily days 5-9 of cycle.  Pt aware can take starting days 3 or 4 as well.  Voiced understanding of directions.

## 2014-04-05 ENCOUNTER — Telehealth: Payer: Self-pay | Admitting: *Deleted

## 2014-04-05 MED ORDER — DICYCLOMINE HCL 20 MG PO TABS: 20.0000 mg | ORAL_TABLET | Freq: Four times a day (QID) | ORAL | Status: DC | PRN

## 2014-04-05 MED ORDER — PROMETHAZINE HCL 25 MG PO TABS: 25.0000 mg | ORAL_TABLET | Freq: Four times a day (QID) | ORAL | Status: DC | PRN

## 2014-04-05 NOTE — Telephone Encounter (Signed)
Patient reports nausea, vomiting and diarrhea since yesterday. Vomiting has improved but nausea and intestinal cramping continues. Denies fever. Patient spoke to Dr Sabra Heck directly. Requested Phenergan and Bentyl. These have worked well for her before. LMP 03-28-14. Allergic to PCN. Patient aware Phenergan will cause drowsiness.   Routing to provider.

## 2014-04-05 NOTE — Telephone Encounter (Signed)
Rx's okayed verbally.  Encounter closed.

## 2014-05-05 ENCOUNTER — Other Ambulatory Visit: Payer: Self-pay | Admitting: Obstetrics & Gynecology

## 2014-05-05 MED ORDER — ETONOGESTREL-ETHINYL ESTRADIOL 0.12-0.015 MG/24HR VA RING: VAGINAL_RING | VAGINAL | Status: DC

## 2014-05-09 ENCOUNTER — Ambulatory Visit (INDEPENDENT_AMBULATORY_CARE_PROVIDER_SITE_OTHER): Payer: 59 | Admitting: Obstetrics & Gynecology

## 2014-05-09 ENCOUNTER — Encounter: Payer: Self-pay | Admitting: Obstetrics & Gynecology

## 2014-05-09 VITALS — BP 106/72 | HR 68 | Resp 16 | Ht 64.5 in | Wt 175.6 lb

## 2014-05-09 DIAGNOSIS — Z01419 Encounter for gynecological examination (general) (routine) without abnormal findings: Secondary | ICD-10-CM

## 2014-05-09 DIAGNOSIS — Z124 Encounter for screening for malignant neoplasm of cervix: Secondary | ICD-10-CM

## 2014-05-09 DIAGNOSIS — Z0000 Encounter for general adult medical examination without abnormal findings: Secondary | ICD-10-CM | POA: Diagnosis not present

## 2014-05-09 LAB — POCT URINALYSIS DIPSTICK
Bilirubin, UA: NEGATIVE
Blood, UA: NEGATIVE
Glucose, UA: NEGATIVE
Ketones, UA: NEGATIVE
Leukocytes, UA: NEGATIVE
Nitrite, UA: NEGATIVE
Protein, UA: NEGATIVE
Urobilinogen, UA: NEGATIVE
pH, UA: 5

## 2014-05-09 NOTE — Progress Notes (Signed)
29 y.o. G1P1 MarriedCaucasianF here for annual exam.  Hasn't had an exam in several years.    H/O Sjogren's disease.  Was on plaquenil in 2011(?).  Pt reports she didn't feel that much improvement considering drug risks so she stopped it.  Has seen Dr. Estanislado Pandy, rheumatologist.  Pt hasn't seen her in several years.  Last visit was at [facility].    Patient's last menstrual period was 04/22/2014.          Sexually active: Yes.    The current method of family planning is none.    Exercising: Yes.    walking Smoker:  no  Health Maintenance: Pap:  5+ years ago History of abnormal Pap:  yes MMG:  none Colonoscopy:  none BMD:   none TDaP:  2010 Screening Labs: none today, Hb today: 12.9, Urine today: negative   reports that she has never smoked. She has never used smokeless tobacco. She reports that she does not drink alcohol or use illicit drugs.  Past Medical History  Diagnosis Date  . Sjogren's syndrome   . Migraines   . Anxiety   . ADD (attention deficit disorder with hyperactivity)   . PONV (postoperative nausea and vomiting)   . Family history of anesthesia complication     N&V    Past Surgical History  Procedure Laterality Date  . Breast surgery    . Wisdom tooth extraction Bilateral 2008  . Hysteroscopy N/A 10/15/2013    Procedure: HYSTEROSCOPY with removal of IUD;  Surgeon: Lyman Speller, MD;  Location: [facility];  Service: Gynecology;  Laterality: N/A;    Current Outpatient Prescriptions  Medication Sig Dispense Refill  . clobetasol ointment (TEMOVATE) 0.05 %   0  . Prenatal Vit-Fe Fumarate-FA (PRENATAL MULTIVITAMIN) TABS tablet Take 1 tablet by mouth daily at 12 noon.    . etonogestrel-ethinyl estradiol (NUVARING) 0.12-0.015 MG/24HR vaginal ring Insert vaginally and leave in place for 3 consecutive weeks, then remove for 1 week. (Patient not taking: Reported on 05/09/2014) 3 each 4  . letrozole (FEMARA) 2.5 MG tablet Take 3 tabs (2.5mg  each) days 5-9 of cycle (Patient not  taking: Reported on 05/09/2014) 15 tablet 0   No current facility-administered medications for this visit.    Family History  Problem Relation Age of Onset  . Sarcoidosis Mother   . Heart disease Maternal Grandfather   . Diabetes Paternal Grandfather   . Heart disease Paternal Grandfather     ROS:  Pertinent items are noted in HPI.  Otherwise, a comprehensive ROS was negative.  Exam:   General appearance: alert, cooperative and appears stated age Head: Normocephalic, without obvious abnormality, atraumatic Neck: no adenopathy, supple, symmetrical, trachea midline and thyroid normal to inspection and palpation Lungs: clear to auscultation bilaterally Breasts: normal appearance, no masses or tenderness Heart: regular rate and rhythm Abdomen: soft, non-tender; bowel sounds normal; no masses,  no organomegaly Extremities: extremities normal, atraumatic, no cyanosis or edema Skin: Skin color, texture, turgor normal. No rashes or lesions Lymph nodes: Cervical, supraclavicular, and axillary nodes normal. No abnormal inguinal nodes palpated Neurologic: Grossly normal   Pelvic: External genitalia:  no lesions              Urethra:  normal appearing urethra with no masses, tenderness or lesions              Bartholins and Skenes: normal                 Vagina: normal appearing vagina with  normal color and discharge, no lesions              Cervix: no lesions              Pap taken: Yes.   Bimanual Exam:  Uterus:  normal size, contour, position, consistency, mobility, non-tender              Adnexa: normal adnexa and no mass, fullness, tenderness               Rectovaginal: Confirms               Anus:  normal sphincter tone, no lesions  Chaperone was present for exam.  A:  Well Woman with normal exam Sjorgen's disease, has been seen by rheumatologist Restarting contraception while contemplating future pregnancy  P:   Mammogram starting at age 62 pap smear with HR HPV today Has rx  for Nuva ring.  Pt will start with next cycle. return annually or prn

## 2014-05-10 LAB — HEMOGLOBIN, FINGERSTICK: Hemoglobin, fingerstick: 12.9 g/dL (ref 12.0–16.0)

## 2014-05-12 LAB — IPS PAP TEST WITH HPV

## 2014-05-13 ENCOUNTER — Other Ambulatory Visit: Payer: Self-pay | Admitting: Obstetrics & Gynecology

## 2014-05-13 MED ORDER — CLOBETASOL PROPIONATE 0.05 % EX OINT: TOPICAL_OINTMENT | CUTANEOUS | Status: DC

## 2014-06-01 ENCOUNTER — Ambulatory Visit (INDEPENDENT_AMBULATORY_CARE_PROVIDER_SITE_OTHER): Payer: 59 | Admitting: Obstetrics and Gynecology

## 2014-06-01 ENCOUNTER — Encounter: Payer: Self-pay | Admitting: Obstetrics and Gynecology

## 2014-06-01 ENCOUNTER — Telehealth: Payer: Self-pay | Admitting: *Deleted

## 2014-06-01 ENCOUNTER — Other Ambulatory Visit: Payer: Self-pay | Admitting: Obstetrics and Gynecology

## 2014-06-01 VITALS — BP 100/68 | HR 70 | Ht 64.5 in | Wt 173.0 lb

## 2014-06-01 DIAGNOSIS — R51 Headache: Secondary | ICD-10-CM | POA: Diagnosis not present

## 2014-06-01 DIAGNOSIS — R519 Headache, unspecified: Secondary | ICD-10-CM

## 2014-06-01 MED ORDER — SUMATRIPTAN SUCCINATE 25 MG PO TABS: 25.0000 mg | ORAL_TABLET | ORAL | Status: DC | PRN

## 2014-06-01 MED ORDER — KETOROLAC TROMETHAMINE 10 MG PO TABS: 10.0000 mg | ORAL_TABLET | Freq: Four times a day (QID) | ORAL | Status: DC | PRN

## 2014-06-01 MED ORDER — PROMETHAZINE HCL 25 MG PO TABS: 25.0000 mg | ORAL_TABLET | Freq: Four times a day (QID) | ORAL | Status: DC | PRN

## 2014-06-01 NOTE — Telephone Encounter (Signed)
Reviewed with Dr Quincy Simmonds. Call to patient for update. States she has just woke up and is aware she still has headache but it is decreased from this am. Was 42/10 and is now 23/10.  Denies nausea but has really just woke up, voice still audibly groggy.  Advised that if headache does not continue to improve or if bets worse, should seek evaluation and pain management at urgent care or ED.  Patient declines at this time, states she wants to wake up and see how she feels once awake but is aware of Dr Elza Rafter recommendation.      Routing to provider for final review.

## 2014-06-01 NOTE — Progress Notes (Signed)
Patient ID: Teresa Holden, female   DOB: Jun 05, 1985, 29 y.o.   MRN: [id number] GYNECOLOGY  VISIT   HPI: 29 y.o.   Married  Caucasian  female   G1P1 with Patient's last menstrual period was 05/16/2014 (exact date).   here for headaches and nausea since inserting Nuvaring on 05-22-14.  No vomiting.  No aura.   NuvaRing is for contraception. Taking a break from fertility attempt at this time.   History of headaches after birth of child, but none in a long time.   Toradol injection usually works for headache.  Took Percocet last hs and this did not help.   GYNECOLOGIC HISTORY: Patient's last menstrual period was 05/16/2014 (exact date). Contraception:  Nuvaring  Menopausal hormone therapy: n/a Last Pap:  05-09-14 wnl:neg HR HPV        OB History    Gravida Para Term Preterm AB TAB SAB Ectopic Multiple Living   1 1        1          Patient Active Problem List   Diagnosis Date Noted  . Sjogren's syndrome   . Migraines   . Anxiety     Past Medical History  Diagnosis Date  . Sjogren's disease   . Migraines   . Anxiety   . ADD (attention deficit disorder with hyperactivity)   . PONV (postoperative nausea and vomiting)   . Family history of anesthesia complication     N&V    Past Surgical History  Procedure Laterality Date  . Wisdom tooth extraction Bilateral 2008  . Hysteroscopy N/A 10/15/2013    Procedure: HYSTEROSCOPY with removal of IUD;  Surgeon: Lyman Speller, MD;  Location: [facility];  Service: Gynecology;  Laterality: N/A;  . Breast enhancement surgery  2013    Dr. Lenon Curt    Current Outpatient Prescriptions  Medication Sig Dispense Refill  . clobetasol ointment (TEMOVATE) 0.05 % Apply as directed 30 g 1  . etonogestrel-ethinyl estradiol (NUVARING) 0.12-0.015 MG/24HR vaginal ring Insert vaginally and leave in place for 3 consecutive weeks, then remove for 1 week. 3 each 4  . Prenatal Vit-Fe Fumarate-FA (PRENATAL MULTIVITAMIN) TABS tablet Take 1 tablet by  mouth daily at 12 noon.     No current facility-administered medications for this visit.     ALLERGIES: Penicillins  Family History  Problem Relation Age of Onset  . Sarcoidosis Mother   . Heart disease Maternal Grandfather   . Diabetes Paternal Grandfather   . Heart disease Paternal Grandfather     History   Social History  . Marital Status: Married    Spouse Name: N/A  . Number of Children: N/A  . Years of Education: N/A   Occupational History  . Not on file.   Social History Main Topics  . Smoking status: Never Smoker   . Smokeless tobacco: Never Used  . Alcohol Use: No  . Drug Use: No  . Sexual Activity:    Partners: Male    Birth Control/ Protection: Inserts     Comment: nuvaring   Other Topics Concern  . Not on file   Social History Narrative    ROS:  Pertinent items are noted in HPI.  PHYSICAL EXAMINATION:    BP 100/68 mmHg  Pulse 70  Ht 5' 4.5" (1.638 m)  Wt 173 lb (78.472 kg)  BMI 29.25 kg/m2  LMP 05/16/2014 (Exact Date)     General appearance: alert, cooperative and appears stated age.  Tearful.  ASSESSMENT  Headache with NuvaRing usage. Normal blood pressure.   PLAN  Remove NuvaRing now.  Patient to go home from work and rest.   Declines visit to ER for injection of Toradol.   Toradol 10 mg po q 6 hours prn.  Phenergan 25 mg po q 6 hours prn.  Office will check on patient later this afternoon.  If headache persists, Imitrex.    An After Visit Summary was printed and given to the patient.  __15____ minutes face to face time of which over 50% was spent in counseling.

## 2014-06-01 NOTE — Telephone Encounter (Signed)
Imitrex Rx sent to [facility] on file for [place].  Message left on patient's cell phone by nursing supervisor, Lamont Snowball, and myself regarding the prescription and instructions to go to urgent care for persistence or worsening of symptoms and any neurological changes associated with the headache.   Encounter closed.

## 2014-06-23 ENCOUNTER — Other Ambulatory Visit: Payer: Self-pay | Admitting: *Deleted

## 2014-06-23 NOTE — Telephone Encounter (Signed)
Patient requests RX for progesterone only pills be sent to [facility] out patient pharmacy. States Dr Quincy Simmonds changed RX after last office visit. Patient states headache has resolved since discontinued Nuvaring on 06-01-14.  Had cycle then and is anticipating next menses to start anytime. Has usually premenstrual symptoms. Desires to start progesterone birth control pills.

## 2014-06-24 MED ORDER — NORETHINDRONE 0.35 MG PO TABS: 1.0000 | ORAL_TABLET | Freq: Every day | ORAL | Status: DC

## 2014-06-30 ENCOUNTER — Ambulatory Visit (INDEPENDENT_AMBULATORY_CARE_PROVIDER_SITE_OTHER): Payer: 59 | Admitting: Family Medicine

## 2014-06-30 ENCOUNTER — Ambulatory Visit: Payer: Self-pay | Admitting: Nurse Practitioner

## 2014-06-30 ENCOUNTER — Other Ambulatory Visit: Payer: Self-pay | Admitting: Obstetrics & Gynecology

## 2014-06-30 VITALS — BP 120/80 | HR 76 | Temp 98.7°F | Resp 17 | Ht 64.75 in | Wt 171.0 lb

## 2014-06-30 DIAGNOSIS — N938 Other specified abnormal uterine and vaginal bleeding: Secondary | ICD-10-CM

## 2014-06-30 DIAGNOSIS — J029 Acute pharyngitis, unspecified: Secondary | ICD-10-CM

## 2014-06-30 LAB — POCT RAPID STREP A (OFFICE): Rapid Strep A Screen: NEGATIVE

## 2014-06-30 NOTE — Progress Notes (Signed)
[facility] [address], [place] [address] [phone]  Date:  06/30/2014   Name:  Teresa Holden   DOB:  1985-12-28   MRN:  [id number]  PCP:  No primary care provider on file.     Chief Complaint: Sore Throat   History of Present Illness:  Teresa Holden is a 29 y.o. very pleasant female patient who presents with the following:  Here today with complaint of St for one week- noted to have white patches on her throat today.   Possible exposure to strep at work.  She has not noted a fever, slight cough but nothing major No ear pain No nausea or vomiting  Menses currently She tried some ibuprofen at home- her last dose was at 0800 today  Patient Active Problem List   Diagnosis Date Noted  . Sjogren's syndrome   . Migraines   . Anxiety     Past Medical History  Diagnosis Date  . Sjogren's disease   . Migraines   . Anxiety   . ADD (attention deficit disorder with hyperactivity)   . PONV (postoperative nausea and vomiting)   . Family history of anesthesia complication     N&V    Past Surgical History  Procedure Laterality Date  . Wisdom tooth extraction Bilateral 2008  . Hysteroscopy N/A 10/15/2013    Procedure: HYSTEROSCOPY with removal of IUD;  Surgeon: Lyman Speller, MD;  Location: [facility];  Service: Gynecology;  Laterality: N/A;  . Breast enhancement surgery  2013    Dr. Lenon Curt    History  Substance Use Topics  . Smoking status: Never Smoker   . Smokeless tobacco: Never Used  . Alcohol Use: No    Family History  Problem Relation Age of Onset  . Sarcoidosis Mother   . Heart disease Maternal Grandfather   . Diabetes Paternal Grandfather   . Heart disease Paternal Grandfather     Allergies  Allergen Reactions  . Penicillins Rash    Medication list has been reviewed and updated.  Current Outpatient Prescriptions on File Prior to Visit  Medication Sig Dispense Refill  . clobetasol ointment (TEMOVATE) 0.05 %  Apply as directed 30 g 1  . norethindrone (ORTHO MICRONOR) 0.35 MG tablet Take 1 tablet (0.35 mg total) by mouth daily. 3 Package 3  . Prenatal Vit-Fe Fumarate-FA (PRENATAL MULTIVITAMIN) TABS tablet Take 1 tablet by mouth daily at 12 noon.    Marland Kitchen ketorolac (TORADOL) 10 MG tablet Take 1 tablet (10 mg total) by mouth every 6 (six) hours as needed. (Patient not taking: Reported on 06/30/2014) 20 tablet 0  . promethazine (PHENERGAN) 25 MG tablet Take 1 tablet (25 mg total) by mouth every 6 (six) hours as needed for nausea or vomiting. (Patient not taking: Reported on 06/30/2014) 10 tablet 0  . SUMAtriptan (IMITREX) 25 MG tablet Take 1 tablet (25 mg total) by mouth every 2 (two) hours as needed for migraine. May repeat in 2 hours if headache persists or recurs. (Patient not taking: Reported on 06/30/2014) 10 tablet 0   No current facility-administered medications on file prior to visit.    Review of Systems:  As per HPI- otherwise negative.   Physical Examination: Filed Vitals:   06/30/14 1140  BP: 120/80  Pulse: 76  Temp: 98.7 F (37.1 C)  Resp: 17   Filed Vitals:   06/30/14 1140  Height: 5' 4.75" (1.645 m)  Weight: 171 lb (77.565 kg)   Body mass index  is 28.66 kg/(m^2). Ideal Body Weight: Weight in (lb) to have BMI = 25: 148.8  GEN: WDWN, NAD, Non-toxic, A & O x 3, looks well HEENT: Atraumatic, Normocephalic. Neck supple. No masses, No LAD.  Bilateral TM wnl, oropharynx normal.  PEERL,EOMI.   Ears and Nose: No external deformity. CV: RRR, No M/G/R. No JVD. No thrill. No extra heart sounds. PULM: CTA B, no wheezes, crackles, rhonchi. No retractions. No resp. distress. No accessory muscle use.Marland Kitchen EXTR: No c/c/e NEURO Normal gait.  PSYCH: Normally interactive. Conversant. Not depressed or anxious appearing.  Calm demeanor.    Assessment and Plan: Acute pharyngitis, unspecified pharyngitis type - Plan: POCT rapid strep A, Culture, Group A Strep  Likely viral or allergic pharyngitis.   Treat symptomatically. Await culture Follow-up if not better  Signed Lamar Blinks, MD

## 2014-06-30 NOTE — Patient Instructions (Signed)
Your strep is negative- I will be in touch with your culture results Let me know if you do not feel better in the next couple of days and you might try taking a claritin for a few days for possible allergies

## 2014-07-01 ENCOUNTER — Other Ambulatory Visit (INDEPENDENT_AMBULATORY_CARE_PROVIDER_SITE_OTHER): Payer: 59

## 2014-07-01 DIAGNOSIS — N938 Other specified abnormal uterine and vaginal bleeding: Secondary | ICD-10-CM

## 2014-07-01 LAB — HCG, SERUM, QUALITATIVE: Preg, Serum: NEGATIVE

## 2014-07-02 LAB — CULTURE, GROUP A STREP: Organism ID, Bacteria: NORMAL

## 2014-07-19 ENCOUNTER — Other Ambulatory Visit: Payer: Self-pay | Admitting: Obstetrics & Gynecology

## 2014-07-19 MED ORDER — AZITHROMYCIN 250 MG PO TABS: ORAL_TABLET | ORAL | Status: DC

## 2014-08-30 ENCOUNTER — Ambulatory Visit (INDEPENDENT_AMBULATORY_CARE_PROVIDER_SITE_OTHER): Payer: 59 | Admitting: Obstetrics & Gynecology

## 2014-08-30 VITALS — BP 112/79 | HR 71 | Resp 16

## 2014-08-30 DIAGNOSIS — G43829 Menstrual migraine, not intractable, without status migrainosus: Secondary | ICD-10-CM | POA: Diagnosis not present

## 2014-08-30 DIAGNOSIS — N943 Premenstrual tension syndrome: Secondary | ICD-10-CM

## 2014-08-30 MED ORDER — KETOROLAC TROMETHAMINE 30 MG/ML IJ SOLN
60.0000 mg | Freq: Once | INTRAMUSCULAR | Status: AC
  Administered 2014-08-30: 60 mg via INTRAMUSCULAR

## 2014-08-30 NOTE — Progress Notes (Signed)
Patient reports headache x 1 day. Hx headaches with menstrual cycle. Motrin not relieving pain. No visual changes or weakness. Pain scale 7/10.   Order from Dr. Sabra Heck for Toradol 60 mg IM x 1.   Toradol 60 mg IM x 1 to Left upper outer quadrant. Patient tolerated well. Resting in office.  Will follow up prn.

## 2014-08-30 NOTE — Progress Notes (Signed)
1430: Patient reports pain is now 0/10. She is discharged from office.  She will follow up as needed with Dr. Sabra Heck.

## 2014-08-30 NOTE — Progress Notes (Signed)
1358: Patient reassessed. Patient reports headache pain is improved. She will continue to rest in office.

## 2014-09-07 ENCOUNTER — Other Ambulatory Visit: Payer: Self-pay | Admitting: *Deleted

## 2014-09-07 ENCOUNTER — Ambulatory Visit (INDEPENDENT_AMBULATORY_CARE_PROVIDER_SITE_OTHER): Payer: 59 | Admitting: Physician Assistant

## 2014-09-07 DIAGNOSIS — R509 Fever, unspecified: Secondary | ICD-10-CM

## 2014-09-07 DIAGNOSIS — R102 Pelvic and perineal pain unspecified side: Secondary | ICD-10-CM

## 2014-09-07 LAB — POCT UA - MICROSCOPIC ONLY
Bacteria, U Microscopic: NEGATIVE
Casts, Ur, LPF, POC: NEGATIVE
Crystals, Ur, HPF, POC: NEGATIVE
Mucus, UA: POSITIVE
RBC, urine, microscopic: NEGATIVE
Yeast, UA: NEGATIVE

## 2014-09-07 LAB — POCT URINALYSIS DIPSTICK
Bilirubin, UA: NEGATIVE
Blood, UA: NEGATIVE
Glucose, UA: NEGATIVE
Leukocytes, UA: NEGATIVE
Nitrite, UA: NEGATIVE
Protein, UA: NEGATIVE
Spec Grav, UA: 1.02
Urobilinogen, UA: 0.2
pH, UA: 5.5

## 2014-09-07 LAB — POCT CBC
Granulocyte percent: 77.9 %G (ref 37–80)
HCT, POC: 42.3 % (ref 37.7–47.9)
Hemoglobin: 13.6 g/dL (ref 12.2–16.2)
Lymph, poc: 1.1 (ref 0.6–3.4)
MCH, POC: 26.1 pg — AB (ref 27–31.2)
MCHC: 32.1 g/dL (ref 31.8–35.4)
MCV: 81.3 fL (ref 80–97)
MID (cbc): 0.3 (ref 0–0.9)
MPV: 8.4 fL (ref 0–99.8)
POC Granulocyte: 5 (ref 2–6.9)
POC LYMPH PERCENT: 17.2 %L (ref 10–50)
POC MID %: 4.9 %M (ref 0–12)
Platelet Count, POC: 242 10*3/uL (ref 142–424)
RBC: 5.2 M/uL (ref 4.04–5.48)
RDW, POC: 13.6 %
WBC: 6.4 10*3/uL (ref 4.6–10.2)

## 2014-09-07 LAB — POCT URINE PREGNANCY: Preg Test, Ur: NEGATIVE

## 2014-09-07 MED ORDER — TRAMADOL HCL 50 MG PO TABS: 50.0000 mg | ORAL_TABLET | Freq: Three times a day (TID) | ORAL | Status: DC | PRN

## 2014-09-07 NOTE — Progress Notes (Signed)
Teresa Holden  MRN: [id number] DOB: 1985-04-08  Subjective:  Pt presents to clinic for left pelvic pain, low back pain that started about 5 days ago that felt like she was starting her menses.  Her LMP was 7/22 was normal for her.  She saw Dr Sabra Heck 2 days ago when the pain became stabbing in her left pelvic pain and she did a pelvic exam that demonstrated probably ovarian cyst and she was told to use motrin and wait.  Then she started to feel worse esp last night she started to feel feverish and chills and overall started to feel bad and the pain became more constant.  She is sexually active with her husband and she is not having any urinary or vaginal symptoms.  Patient Active Problem List   Diagnosis Date Noted  . Sjogren's syndrome   . Migraines   . Anxiety     Current Outpatient Prescriptions on File Prior to Visit  Medication Sig Dispense Refill  . clobetasol ointment (TEMOVATE) 0.05 % Apply as directed 30 g 1  . norethindrone (ORTHO MICRONOR) 0.35 MG tablet Take 1 tablet (0.35 mg total) by mouth daily. 3 Package 3  . Prenatal Vit-Fe Fumarate-FA (PRENATAL MULTIVITAMIN) TABS tablet Take 1 tablet by mouth daily at 12 noon.     No current facility-administered medications on file prior to visit.    Allergies  Allergen Reactions  . Penicillins Rash    Review of Systems  Constitutional: Positive for fever and chills.  Gastrointestinal: Positive for nausea. Negative for vomiting, abdominal pain and diarrhea.  Genitourinary: Negative for dysuria, vaginal discharge and menstrual problem.   Objective:  BP 110/70 mmHg  Pulse 100  Temp(Src) 99.2 F (37.3 C) (Oral)  Resp 16  Ht 5' 4.5" (1.638 m)  Wt 166 lb (75.297 kg)  BMI 28.06 kg/m2  SpO2 99%  LMP 08/26/2014  Physical Exam  Constitutional: She is oriented to person, place, and time and well-developed, well-nourished, and in no distress.  HENT:  Head: Normocephalic and atraumatic.  Right Ear: Hearing and external  ear normal.  Left Ear: Hearing and external ear normal.  Eyes: Conjunctivae are normal.  Neck: Normal range of motion.  Cardiovascular: Normal rate, regular rhythm and normal heart sounds.   No murmur heard. Pulmonary/Chest: Effort normal and breath sounds normal.  Abdominal: Soft. There is tenderness in the right lower quadrant. There is no rebound, no CVA tenderness and no tenderness at McBurney's point.  Genitourinary:  TTP over her right ovary.  Neurological: She is alert and oriented to person, place, and time. Gait normal.  Skin: Skin is warm and dry.  Psychiatric: Mood, memory, affect and judgment normal.  Vitals reviewed.   Results for orders placed or performed in visit on 09/07/14  POCT urinalysis dipstick  Result Value Ref Range   Color, UA yellow    Clarity, UA cloudy    Glucose, UA neg    Bilirubin, UA neg    Ketones, UA trace    Spec Grav, UA 1.020    Blood, UA neg    pH, UA 5.5    Protein, UA neg    Urobilinogen, UA 0.2    Nitrite, UA neg    Leukocytes, UA Negative Negative  POCT UA - Microscopic Only  Result Value Ref Range   WBC, Ur, HPF, POC 0-1    RBC, urine, microscopic neg    Bacteria, U Microscopic neg    Mucus, UA positive    Epithelial cells,  urine per micros 0-1    Crystals, Ur, HPF, POC neg    Casts, Ur, LPF, POC neg    Yeast, UA neg   POCT CBC  Result Value Ref Range   WBC 6.4 4.6 - 10.2 K/uL   Lymph, poc 1.1 0.6 - 3.4   POC LYMPH PERCENT 17.2 10 - 50 %L   MID (cbc) 0.3 0 - 0.9   POC MID % 4.9 0 - 12 %M   POC Granulocyte 5.0 2 - 6.9   Granulocyte percent 77.9 37 - 80 %G   RBC 5.20 4.04 - 5.48 M/uL   Hemoglobin 13.6 12.2 - 16.2 g/dL   HCT, POC 42.3 37.7 - 47.9 %   MCV 81.3 80 - 97 fL   MCH, POC 26.1 (A) 27 - 31.2 pg   MCHC 32.1 31.8 - 35.4 g/dL   RDW, POC 13.6 %   Platelet Count, POC 242 142 - 424 K/uL   MPV 8.4 0 - 99.8 fL  POCT urine pregnancy  Result Value Ref Range   Preg Test, Ur Negative Negative    Assessment and Plan :    Pelvic pain in female - Plan: POCT urinalysis dipstick, POCT UA - Microscopic Only, POCT urine pregnancy  Fever and chills - Plan: POCT CBC   Pt has a normal CBC and normal urine and she had a normal pelvic exam 48hago.  She is going to continue her Motrin 800mg  tid and then we will add Ultram.  She will f/u with Dr Sabra Heck in 48h if she is still having pain for a pelvic US to determine what type of cyst and if it has ruptured.  She will contact me if she has any further problems.  Windell Hummingbird PA-C  [facility] 09/07/2014 10:51 AM

## 2014-09-08 ENCOUNTER — Ambulatory Visit (INDEPENDENT_AMBULATORY_CARE_PROVIDER_SITE_OTHER): Payer: 59 | Admitting: Obstetrics & Gynecology

## 2014-09-08 ENCOUNTER — Encounter: Payer: Self-pay | Admitting: Obstetrics & Gynecology

## 2014-09-08 ENCOUNTER — Ambulatory Visit (INDEPENDENT_AMBULATORY_CARE_PROVIDER_SITE_OTHER): Payer: 59

## 2014-09-08 VITALS — BP 106/78 | HR 60 | Resp 25 | Wt 166.0 lb

## 2014-09-08 DIAGNOSIS — R102 Pelvic and perineal pain: Secondary | ICD-10-CM | POA: Diagnosis not present

## 2014-09-08 DIAGNOSIS — N8320 Unspecified ovarian cysts: Secondary | ICD-10-CM | POA: Diagnosis not present

## 2014-09-08 DIAGNOSIS — N83201 Unspecified ovarian cyst, right side: Secondary | ICD-10-CM

## 2014-09-08 NOTE — Progress Notes (Signed)
29 y.o. G1P1 Marriedfemale here for a pelvic ultrasound due to LLQ pain.  Patient having some nausea today as well.  Missed work yesterday due to pain.  LMP 08/26/14.  Noted a small amt of spotting today.  No cramping.  [facility] with neg preg test.  U/A and CBC were normal as well.  Patient's last menstrual period was 08/26/2014.  Sexually active:  yes  Contraception: micronor  FINDINGS: UTERUS: 8.0 x 4.7 x 3.5cm EMS: 3.10mm ADNEXA:   Left ovary 3.2 x 2.5 x 2.2cm   Right ovary 2.2 x 2.1 x 2.2cm with 1.8cm simple appearing (avascular and thin walled) cyst CUL DE SAC: no free fluid  Findings reviewed.  Pt will let me know if pain does not improve although it already has so cyst is most likely smaller already.  Assessment:  Right 1.8cm simple ovarian cyst Plan: Pt will monitor conservatively and let me know if symptoms are not significantly improved over the next few days. Has ultram.  No new rx's needed.

## 2014-09-09 ENCOUNTER — Encounter: Payer: Self-pay | Admitting: Obstetrics & Gynecology

## 2014-09-26 ENCOUNTER — Other Ambulatory Visit: Payer: Self-pay | Admitting: Obstetrics & Gynecology

## 2014-09-26 MED ORDER — NORETHIN-ETH ESTRAD-FE BIPHAS 1 MG-10 MCG / 10 MCG PO TABS: 1.0000 | ORAL_TABLET | Freq: Every day | ORAL | Status: DC

## 2014-11-24 ENCOUNTER — Other Ambulatory Visit: Payer: Self-pay | Admitting: Obstetrics and Gynecology

## 2014-11-24 MED ORDER — SUMATRIPTAN SUCCINATE 25 MG PO TABS: ORAL_TABLET | ORAL | Status: DC

## 2015-02-17 MED FILL — LO LOESTRIN FE 1-10 TABLET: 1 MG-10 MCG | 28 days supply | Qty: 28 | Fill #5

## 2015-03-17 ENCOUNTER — Other Ambulatory Visit: Payer: Self-pay | Admitting: Obstetrics & Gynecology

## 2015-03-17 MED FILL — LO LOESTRIN FE 1-10 TABLET: 1 MG-10 MCG | 84 days supply | Qty: 84 | Fill #0

## 2015-03-17 NOTE — Telephone Encounter (Signed)
Medication refill request: OCP Last AEX:  05/09/14 Next AEX: 07/21/15 SM Last MMG (if hormonal medication request): none Refill authorized: #3packs/0R.  Rx sent to pharmacy. #3pack/0R to last until 05/2015 Dr. Lestine Box

## 2015-03-20 DIAGNOSIS — Z6830 Body mass index (BMI) 30.0-30.9, adult: Secondary | ICD-10-CM | POA: Diagnosis not present

## 2015-03-20 DIAGNOSIS — J45998 Other asthma: Secondary | ICD-10-CM | POA: Diagnosis not present

## 2015-03-20 DIAGNOSIS — J069 Acute upper respiratory infection, unspecified: Secondary | ICD-10-CM | POA: Diagnosis not present

## 2015-05-15 ENCOUNTER — Ambulatory Visit: Payer: 59 | Admitting: Obstetrics & Gynecology

## 2015-05-23 ENCOUNTER — Ambulatory Visit (INDEPENDENT_AMBULATORY_CARE_PROVIDER_SITE_OTHER): Payer: 59 | Admitting: Obstetrics & Gynecology

## 2015-05-23 ENCOUNTER — Encounter: Payer: Self-pay | Admitting: Obstetrics & Gynecology

## 2015-05-23 VITALS — BP 110/70 | HR 76 | Resp 14 | Ht 63.0 in | Wt 174.0 lb

## 2015-05-23 DIAGNOSIS — Z0000 Encounter for general adult medical examination without abnormal findings: Secondary | ICD-10-CM

## 2015-05-23 DIAGNOSIS — M25512 Pain in left shoulder: Secondary | ICD-10-CM | POA: Diagnosis not present

## 2015-05-23 DIAGNOSIS — Z01419 Encounter for gynecological examination (general) (routine) without abnormal findings: Secondary | ICD-10-CM

## 2015-05-23 LAB — COMPREHENSIVE METABOLIC PANEL
ALT: 20 U/L (ref 6–29)
AST: 17 U/L (ref 10–30)
Albumin: 3.9 g/dL (ref 3.6–5.1)
Alkaline Phosphatase: 43 U/L (ref 33–115)
BUN: 12 mg/dL (ref 7–25)
CO2: 26 mmol/L (ref 20–31)
Calcium: 8.8 mg/dL (ref 8.6–10.2)
Chloride: 104 mmol/L (ref 98–110)
Creat: 0.79 mg/dL (ref 0.50–1.10)
Glucose, Bld: 94 mg/dL (ref 65–99)
Potassium: 4.3 mmol/L (ref 3.5–5.3)
Sodium: 139 mmol/L (ref 135–146)
Total Bilirubin: 0.6 mg/dL (ref 0.2–1.2)
Total Protein: 6.6 g/dL (ref 6.1–8.1)

## 2015-05-23 LAB — POCT URINALYSIS DIPSTICK
Bilirubin, UA: NEGATIVE
Blood, UA: NEGATIVE
Glucose, UA: NEGATIVE
Ketones, UA: NEGATIVE
Leukocytes, UA: NEGATIVE
Nitrite, UA: NEGATIVE
Protein, UA: NEGATIVE
Urobilinogen, UA: NEGATIVE
pH, UA: 6.5

## 2015-05-23 LAB — LIPID PANEL
Cholesterol: 188 mg/dL (ref 125–200)
HDL: 49 mg/dL (ref >=46)
LDL Cholesterol: 110 mg/dL (ref <130)
Total CHOL/HDL Ratio: 3.8 Ratio (ref <=5.0)
Triglycerides: 144 mg/dL (ref <150)
VLDL: 29 mg/dL (ref <30)

## 2015-05-23 LAB — TSH: TSH: 1.12 mIU/L

## 2015-05-23 MED ORDER — NORETHIN-ETH ESTRAD-FE BIPHAS 1 MG-10 MCG / 10 MCG PO TABS: 1.0000 | ORAL_TABLET | Freq: Every day | ORAL | Status: DC

## 2015-05-23 NOTE — Addendum Note (Signed)
Addended by: Susanne Greenhouse E on: 05/23/2015 11:03 AM   Modules accepted: Orders

## 2015-05-23 NOTE — Addendum Note (Signed)
Addended by: Susanne Greenhouse E on: 05/23/2015 11:04 AM   Modules accepted: Orders

## 2015-05-23 NOTE — Progress Notes (Signed)
Patient ID: Teresa Holden, female   DOB: 11/13/1985, 30 y.o.   MRN: [id number]   30 y.o. G1P1 MarriedCaucasianF here for annual exam.  Cycles are regular and very light.    Pt feel down her stairs and had left shoulder pain.  Couldn't lift above 90 degrees.  That is better but there is still pain with weight bearing on lower arm.  Patient's last menstrual period was 05/15/2015.          Sexually active: Yes.    The current method of family planning is OCP (estrogen/progesterone).    Exercising: Yes.    cardio/ weights Smoker:  no  Health Maintenance: Pap:  05-09-14 WNL NEG HR HPV History of abnormal Pap:  yes MMG:  N/A Colonoscopy:  N/A BMD:   N/A TDaP:  2010 Screening Labs: drawn today, Hb today: 12.9, Urine today: WNL    reports that she has never smoked. She has never used smokeless tobacco. She reports that she does not drink alcohol or use illicit drugs.  Past Medical History  Diagnosis Date  . Sjogren's disease ([place])   . Migraines   . Anxiety   . ADD (attention deficit disorder with hyperactivity)   . PONV (postoperative nausea and vomiting)   . Family history of anesthesia complication     N&V    Past Surgical History  Procedure Laterality Date  . Wisdom tooth extraction Bilateral 2008  . Hysteroscopy N/A 10/15/2013    Procedure: HYSTEROSCOPY with removal of IUD;  Surgeon: Lyman Speller, MD;  Location: [facility];  Service: Gynecology;  Laterality: N/A;  . Breast enhancement surgery  2013    Dr. Lenon Curt    Current Outpatient Prescriptions  Medication Sig Dispense Refill  . clobetasol ointment (TEMOVATE) 0.05 % Apply as directed 30 g 1  . LO LOESTRIN FE 1 MG-10 MCG / 10 MCG tablet TAKE 1 TABLET BY MOUTH ONCE DAILY 3 Package 0   No current facility-administered medications for this visit.    Family History  Problem Relation Age of Onset  . Sarcoidosis Mother   . Heart disease Maternal Grandfather   . Diabetes Paternal Grandfather   . Heart disease Paternal  Grandfather     ROS:  Pertinent items are noted in HPI.  Otherwise, a comprehensive ROS was negative.  Exam:   BP 110/70 mmHg  Pulse 76  Resp 14  Ht 5\' 3"  (1.6 m)  Wt 174 lb (78.926 kg)  BMI 30.83 kg/m2  LMP 05/15/2015  Weight change: -1#  Height: 5\' 3"  (160 cm)  Ht Readings from Last 3 Encounters:  05/23/15 5\' 3"  (1.6 m)  09/07/14 5' 4.5" (1.638 m)  06/30/14 5' 4.75" (1.645 m)    General appearance: alert, cooperative and appears stated age Head: Normocephalic, without obvious abnormality, atraumatic Neck: no adenopathy, supple, symmetrical, trachea midline and thyroid normal to inspection and palpation Lungs: clear to auscultation bilaterally Breasts: normal appearance, no masses or tenderness, bilateral implants present Heart: regular rate and rhythm Abdomen: soft, non-tender; bowel sounds normal; no masses,  no organomegaly Extremities: extremities normal, atraumatic, no cyanosis or edema Skin: Skin color, texture, turgor normal. No rashes or lesions Lymph nodes: Cervical, supraclavicular, and axillary nodes normal. No abnormal inguinal nodes palpated Neurologic: Grossly normal   Pelvic: External genitalia:  no lesions              Urethra:  normal appearing urethra with no masses, tenderness or lesions  Bartholins and Skenes: normal                 Vagina: normal appearing vagina with normal color and discharge, no lesions              Cervix: no lesions              Pap taken: No. Bimanual Exam:  Uterus:  normal size, contour, position, consistency, mobility, non-tender              Adnexa: normal adnexa and no mass, fullness, tenderness               Rectovaginal: Confirms               Anus:  normal sphincter tone, no lesions  Chaperone was present for exam.  A:  Well Woman with normal exam Sjogren's syndrome.  Has seen Dr. Dora Sims in the past.  Off plaquenil for about 5 years. Migraines without aura On OCPs Mother with hx of Sarcoidosis Left  shoulder pain after   P:   Mammogram screening recommendations discussed pap smear with neg HR HPV 2016.  No pap smear obtained today. RF on Loloestrin rx to pharmacy.  #90 day supply/4RF.  Referral to ortho.  return annually or prn

## 2015-05-24 LAB — CBC
HCT: 42.9 % (ref 35.0–45.0)
Hemoglobin: 13.4 g/dL (ref 11.7–15.5)
MCH: 27 pg (ref 27.0–33.0)
MCHC: 31.2 g/dL — ABNORMAL LOW (ref 32.0–36.0)
MCV: 86.5 fL (ref 80.0–100.0)
MPV: 10.8 fL (ref 7.5–12.5)
Platelets: 282 10*3/uL (ref 140–400)
RBC: 4.96 MIL/uL (ref 3.80–5.10)
RDW: 13.8 % (ref 11.0–15.0)
WBC: 6.9 10*3/uL (ref 3.8–10.8)

## 2015-05-24 LAB — VITAMIN D 25 HYDROXY (VIT D DEFICIENCY, FRACTURES): Vit D, 25-Hydroxy: 34 ng/mL (ref 30–100)

## 2015-05-25 DIAGNOSIS — S40012A Contusion of left shoulder, initial encounter: Secondary | ICD-10-CM | POA: Diagnosis not present

## 2015-05-31 ENCOUNTER — Other Ambulatory Visit: Payer: Self-pay | Admitting: Obstetrics and Gynecology

## 2015-05-31 ENCOUNTER — Telehealth: Payer: Self-pay | Admitting: Obstetrics and Gynecology

## 2015-05-31 DIAGNOSIS — G43011 Migraine without aura, intractable, with status migrainosus: Secondary | ICD-10-CM

## 2015-05-31 NOTE — Telephone Encounter (Signed)
Spoke with the patient. She is having issues with severe, intractable migraines and requests referral to see Dr Jaynee Eagles in Neurology. Referral placed.

## 2015-06-05 MED FILL — LO LOESTRIN FE 1-10 TABLET: 1 MG-10 MCG | 84 days supply | Qty: 84 | Fill #0

## 2015-06-08 ENCOUNTER — Encounter: Payer: Self-pay | Admitting: Neurology

## 2015-06-08 ENCOUNTER — Telehealth: Payer: Self-pay | Admitting: *Deleted

## 2015-06-08 ENCOUNTER — Ambulatory Visit (INDEPENDENT_AMBULATORY_CARE_PROVIDER_SITE_OTHER): Payer: 59 | Admitting: Neurology

## 2015-06-08 VITALS — BP 110/78 | HR 82 | Resp 20 | Ht 63.0 in | Wt 172.0 lb

## 2015-06-08 DIAGNOSIS — G43009 Migraine without aura, not intractable, without status migrainosus: Secondary | ICD-10-CM | POA: Diagnosis not present

## 2015-06-08 DIAGNOSIS — G43709 Chronic migraine without aura, not intractable, without status migrainosus: Secondary | ICD-10-CM

## 2015-06-08 MED ORDER — ELETRIPTAN HYDROBROMIDE 40 MG PO TABS: 40.0000 mg | ORAL_TABLET | ORAL | Status: DC | PRN

## 2015-06-08 MED ORDER — TOPIRAMATE ER 100 MG PO CAP24: 100.0000 mg | ORAL_CAPSULE | Freq: Every day | ORAL | Status: DC

## 2015-06-08 MED ORDER — KETOROLAC TROMETHAMINE 60 MG/2ML IM SOLN
60.0000 mg | Freq: Once | INTRAMUSCULAR | Status: AC
  Administered 2015-06-08: 60 mg via INTRAMUSCULAR

## 2015-06-08 NOTE — Patient Instructions (Addendum)
Overall you are doing fairly well but I do want to suggest a few things today:   Remember to drink plenty of fluid, eat healthy meals and do not skip any meals. Try to eat protein with a every meal and eat a healthy snack such as fruit or nuts in between meals. Try to keep a regular sleep-wake schedule and try to exercise daily, particularly in the form of walking, 20-30 minutes a day, if you can.   As far as your medications are concerned, I would like to suggest: Toradol shot today Relpax for acute management. Please take one tablet at the onset of your headache. If it does not improve the symptoms please take one additional tablet. Do not take more then 2 tablets in 24hrs. Do not take use more then 2 to 3 times in a week. Sprix for acute management Trokendi as discussed  As far as diagnostic testing: none  Please also call us for any test results so we can go over those with you on the phone.  My clinical assistant and will answer any of your questions and relay your messages to me and also relay most of my messages to you.   Our phone number is [phone]. We also have an after hours call service for urgent matters and there is a physician on-call for urgent questions. For any emergencies you know to call 911 or go to the nearest emergency room

## 2015-06-08 NOTE — Progress Notes (Signed)
[facility]    Provider:  Dr Jaynee Eagles Referring Provider:  Talbert Nan. Sharee Pimple MD Primary Care Physician:  Talbert Nan. Sharee Pimple MD  CC:  migraines  HPI:  Teresa Holden is a 30 y.o. female here as a referral from Dr. Talbert Nan. Sharee Pimple MD or migraines. PMhx migraines, Sjogren's syndrome, Anxiety.  Symptoms started 10 years ago. She [facility] and was placed on topamax with good results. She stopped it on her own when the symptoms improved. Headaches are worse with stress otherwise no known triggers, no known inciting events, no trauma. She was restarted on 25mg  of Topamax and she felt crummy and stopped it. She takes imitrex and feels hot and dizzy. She tried maxalt, relpax. Migraines 3-4x a week. She had one for 4 days last week. On the left side, pouding, throbbing, +light sensitivity, +Loud noises bother her, she has to lay down, +nausea but no vomiting. Has tension in the shoulders. She tried lexapro in the past, naproxen, toradol, zofran, phenergan, zanaflex. She has been having migraines at this frequency for over 6 months. She has 20 headache days a month with at least 10 migrainous. Some mornings she wakes up with the headaches. The migraines last 24 hours or longer. They can be severe 10 out of 10 in pain. No vision changes, no dysarthria, no dysphagia, no neck pain, no other focal neurologic symptoms or deficits.No medication overuse. No family history of migraines. Mother with history of sarcoidosis. Toradol shots in the past that helped the best for acute management of migraines. No aura.  Tried: Topiramate, Imitrex (felt hot and dizzy), Maxalt, Relpax, Lexapro, naproxen, Toradol, Zofran, Phenergan, Zanaflex.   Reviewed notes, labs and imaging from outside physicians, which showed:   CT of the head 03/14/2011 showed No acute intracranial abnormalities including mass lesion or mass effect, hydrocephalus, extra-axial fluid collection, midline shift, hemorrhage,  or acute infarction, large ischemic events (personally reviewed images) CBC and CMP unremarkable, creatinine 0.79. TSH 1.12 within normal limits.  Review of Systems: Patient complains of symptoms per HPI as well as the following symptoms: No chest pain, no shortness of breath. Pertinent negatives per HPI. All others negative.   Social History   Social History  . Marital Status: Married    Spouse Name: N/A  . Number of Children: N/A  . Years of Education: N/A   Occupational History  . Not on file.   Social History Main Topics  . Smoking status: Never Smoker   . Smokeless tobacco: Never Used  . Alcohol Use: No  . Drug Use: No  . Sexual Activity:    Partners: Male    Birth Control/ Protection: Inserts     Comment: nuvaring   Other Topics Concern  . Not on file   Social History Narrative   Drinks maybe 1 soda daily.    Family History  Problem Relation Age of Onset  . Sarcoidosis Mother   . Heart disease Maternal Grandfather   . Diabetes Paternal Grandfather   . Heart disease Paternal Grandfather     Past Medical History  Diagnosis Date  . Sjogren's disease ([place])   . Migraines   . Anxiety   . ADD (attention deficit disorder with hyperactivity)   . Family history of anesthesia complication     N&V    Past Surgical History  Procedure Laterality Date  . Wisdom tooth extraction Bilateral 2008  . Hysteroscopy N/A 10/15/2013    Procedure: HYSTEROSCOPY with removal of IUD;  Surgeon: Stanton Kidney  Edwinna Areola, MD;  Location: [facility];  Service: Gynecology;  Laterality: N/A;  . Breast enhancement surgery  2013    Dr. Lenon Curt    Current Outpatient Prescriptions  Medication Sig Dispense Refill  . clobetasol ointment (TEMOVATE) 0.05 % Apply as directed 30 g 1  . Norethindrone-Ethinyl Estradiol-Fe Biphas (LO LOESTRIN FE) 1 MG-10 MCG / 10 MCG tablet Take 1 tablet by mouth daily. 3 Package 4   No current facility-administered medications for this visit.    Allergies as of  06/08/2015 - Review Complete 06/08/2015  Allergen Reaction Noted  . Penicillins Rash 03/11/2011    Vitals: BP 110/78 mmHg  Pulse 82  Resp 20  Ht 5\' 3"  (1.6 m)  Wt 172 lb (78.019 kg)  BMI 30.48 kg/m2  LMP 05/15/2015 Last Weight:  Wt Readings from Last 1 Encounters:  06/08/15 172 lb (78.019 kg)   Last Height:   Ht Readings from Last 1 Encounters:  06/08/15 5\' 3"  (1.6 m)   Physical exam: Exam: Gen: NAD, conversant, well nourised, obese, well groomed                     CV: RRR, no MRG. No Carotid Bruits. No peripheral edema, warm, nontender Eyes: Conjunctivae clear without exudates or hemorrhage  Neuro: Detailed Neurologic Exam  Speech:    Speech is normal; fluent and spontaneous with normal comprehension.  Cognition:    The patient is oriented to person, place, and time;     recent and remote memory intact;     language fluent;     normal attention, concentration,     fund of knowledge Cranial Nerves:    The pupils are equal, round, and reactive to light. The fundi are normal and spontaneous venous pulsations are present. Visual fields are full to finger confrontation. Extraocular movements are intact. Trigeminal sensation is intact and the muscles of mastication are normal. The face is symmetric. The palate elevates in the midline. Hearing intact. Voice is normal. Shoulder shrug is normal. The tongue has normal motion without fasciculations.   Coordination:    Normal finger to nose and heel to shin. Normal rapid alternating movements.   Gait:    Heel-toe and tandem gait are normal.   Motor Observation:    No asymmetry, no atrophy, and no involuntary movements noted. Tone:    Normal muscle tone.    Posture:    Posture is normal. normal erect    Strength:    Strength is V/V in the upper and lower limbs.      Sensation: intact to LT     Reflex Exam:  DTR's:    Deep tendon reflexes in the upper and lower extremities are normal bilaterally.   Toes:    The  toes are downgoing bilaterally.   Clonus:    Clonus is absent.       Assessment/Plan:  30 year old female with chronic migraines, without aura, not intractable without status migrainosus.  Start Trokendi. She's had side effects on topiramate in the past, Trokendi is an extended release medication has been shown to have less side effects especially cognitive. Discussed side effects. Serious side effects can inclue: Abdominal or stomach pain ,fever, chills, or sore throat , lessening of sensations or perception ,loss of appetite , mood or mental changes, including aggression, agitation, apathy, irritability, and mental depression , red, irritated, or bleeding gums , weight loss do not get pregnant due to teratogenicity. Rare Blood in the urine , decrease in sexual  performance or desire , difficult or painful urination , frequent urination , hearing loss , loss of bladder control , lower back or side pain , nosebleeds , pale skin , red or irritated eyes , ringing or buzzing in the ears , skin rash or itching , swelling , trouble breathing, Common side effects of Topamax include:  tiredness, drowsiness, dizziness, nervousness, numbness or tingly feeling, coordination problems, diarrhea, weight loss, speech/language problems, changes in vision, sensory distortion, loss of appetite, bad taste in your mouth, confusion, slowed thinking, trouble concentrating or paying attention, memory problems, For acute management: Order Sprix, Relpax for acute management Botox for migraines, had a long discussion.  To prevent or relieve headaches, try the following: Cool Compress. Lie down and place a cool compress on your head.  Avoid headache triggers. If certain foods or odors seem to have triggered your migraines in the past, avoid them. A headache diary might help you identify triggers.  Include physical activity in your daily routine. Try a daily walk or other moderate aerobic exercise.  Manage stress. Find  healthy ways to cope with the stressors, such as delegating tasks on your to-do list.  Practice relaxation techniques. Try deep breathing, yoga, massage and visualization.  Eat regularly. Eating regularly scheduled meals and maintaining a healthy diet might help prevent headaches. Also, drink plenty of fluids.  Follow a regular sleep schedule. Sleep deprivation might contribute to headaches Consider biofeedback. With this mind-body technique, you learn to control certain bodily functions - such as muscle tension, heart rate and blood pressure - to prevent headaches or reduce headache pain.    Proceed to emergency room if you experience new or worsening symptoms or symptoms do not resolve, if you have new neurologic symptoms or if headache is severe, or for any concerning symptom.    CC: Jertson. Sharee Pimple MD  Sarina Ill, MD  [facility] [address] [place], [place] [address]  Phone [phone] Fax [phone]

## 2015-06-08 NOTE — Telephone Encounter (Signed)
Dr Jaynee Eagles- FYI Called pt back. Advised I received signed copy back, however, the copy did not come through clear. There were some portions cut off. I asked if she could re-fax. She states she had to leave work. They had some problems with water and had to leave. She did not have access to a fax machine. She wanted to email, but I advised we cannot use [facility] email. I advised she can attach file to [employer] and send it that way. She is going to look when she gets home to see if she has paperwork. She will try to send via mychart. I asked her to call back and let me know. I will wait to get signed form to send information.   She states that she made account via [facility] where rx sprix comes from. She also called and made account via Botox online. She would like to move forward with botox. She states she told Dr Jaynee Eagles she would like to think about it, but she wants to move forward with it. I advised I will let Danielle/Dr Jaynee Eagles know. Andee Poles will call her with updates. She verbalized understanding.

## 2015-06-08 NOTE — Telephone Encounter (Signed)
Called pt. Advised I need her to sign form for me to be able to send health information to specialty pharmacy to order Sprix. I faxed sprix form to [phone] per pt request. She is going to fax signed form back to [phone].

## 2015-06-09 MED ORDER — KETOROLAC TROMETHAMINE 15.75 MG/SPRAY NA SOLN: NASAL | Status: DC

## 2015-06-09 MED FILL — RELPAX 40 MG TABLET: 40 | 30 days supply | Qty: 9 | Fill #0

## 2015-06-09 NOTE — Telephone Encounter (Signed)
[place] back RE sprix rx. Spoke to [employer]. Advised we are still processing. She stated pt does not need to sign authorization at bottom. They will call pt to verify information. Advised once completed, I will fax to them. She verbalized understanding.

## 2015-06-09 NOTE — Telephone Encounter (Signed)
[facility] [phone] called to advise they haven't received Rx for Sprix. Fax# [phone].

## 2015-06-10 DIAGNOSIS — G43709 Chronic migraine without aura, not intractable, without status migrainosus: Secondary | ICD-10-CM | POA: Insufficient documentation

## 2015-06-12 NOTE — Telephone Encounter (Signed)
Gave completed botox form to [facility]. Dx code [id number]. 200units.

## 2015-06-12 NOTE — Telephone Encounter (Signed)
Faxed completed sprix form to [place] signed by Dr Jaynee Eagles. Included rx sprix. Fax: [phone]. Received confirmation.

## 2015-06-20 NOTE — Telephone Encounter (Signed)
Pt called wants to know the status of botox

## 2015-07-01 ENCOUNTER — Other Ambulatory Visit: Payer: Self-pay | Admitting: Obstetrics & Gynecology

## 2015-07-01 MED ORDER — CITALOPRAM HYDROBROMIDE 20 MG PO TABS: 20.0000 mg | ORAL_TABLET | Freq: Every day | ORAL | Status: DC

## 2015-07-04 ENCOUNTER — Telehealth: Payer: Self-pay | Admitting: *Deleted

## 2015-07-04 MED FILL — RELPAX 40 MG TABLET: 40 | 30 days supply | Qty: 9 | Fill #1

## 2015-07-04 MED FILL — CITALOPRAM HBR 20 MG TABLET: 20 | 30 days supply | Qty: 30 | Fill #0

## 2015-07-04 NOTE — Telephone Encounter (Signed)
Patient checking on Celexa RX. Advised RX was sent on 07-01-15.   Routing to provider for final review. Patient agreeable to disposition. Will close encounter.

## 2015-07-11 ENCOUNTER — Ambulatory Visit: Payer: 59 | Admitting: Neurology

## 2015-07-11 ENCOUNTER — Telehealth: Payer: Self-pay | Admitting: *Deleted

## 2015-07-11 NOTE — Telephone Encounter (Signed)
no showed f/u 

## 2015-07-12 ENCOUNTER — Telehealth: Payer: Self-pay | Admitting: Neurology

## 2015-07-12 ENCOUNTER — Encounter: Payer: Self-pay | Admitting: Neurology

## 2015-07-12 NOTE — Telephone Encounter (Signed)
[facility] [phone] called regarding BOTOX, needs patient's phone number, states prescription did not list phone number.

## 2015-07-14 ENCOUNTER — Encounter: Payer: Self-pay | Admitting: Neurology

## 2015-07-17 ENCOUNTER — Telehealth: Payer: Self-pay | Admitting: Neurology

## 2015-07-17 NOTE — Telephone Encounter (Signed)
Patient called regarding BOTOX

## 2015-07-19 MED FILL — BOTOX 100 UNITS VIAL: 100 | 30 days supply | Qty: 2 | Fill #0

## 2015-07-19 NOTE — Telephone Encounter (Signed)
Patient called requesting to speak with Andee Poles about BOTOX, states pharmacy does not have this medication and may need to cancel her appointment.  Please call [phone].

## 2015-07-19 NOTE — Telephone Encounter (Signed)
Pt called said she has not heard from pharmacy reg botox. Danielle was skyped, she said to relay to pt she will call pharmacy and then call the pt.

## 2015-07-19 NOTE — Telephone Encounter (Signed)
Spoke with patient and informed her that is was going through [employer].

## 2015-07-20 ENCOUNTER — Ambulatory Visit (INDEPENDENT_AMBULATORY_CARE_PROVIDER_SITE_OTHER): Payer: 59 | Admitting: Neurology

## 2015-07-20 ENCOUNTER — Encounter: Payer: Self-pay | Admitting: Neurology

## 2015-07-20 VITALS — BP 120/70 | HR 73 | Ht 63.0 in

## 2015-07-20 DIAGNOSIS — G43709 Chronic migraine without aura, not intractable, without status migrainosus: Secondary | ICD-10-CM

## 2015-07-20 MED ORDER — KETOROLAC TROMETHAMINE 30 MG/ML IJ SOLN: 30.0000 mg | Freq: Once | INTRAMUSCULAR | Status: DC

## 2015-07-20 MED FILL — KETOROLAC 30 MG/ML VIAL: 30 | 17 days supply | Qty: 10 | Fill #0

## 2015-07-20 NOTE — Progress Notes (Signed)
Consent Form Botulism Toxin Injection For Chronic Migraine  Botulism toxin has been approved by the Federal drug administration for treatment of chronic migraine. Botulism toxin does not cure chronic migraine and it may not be effective in some patients.  The administration of botulism toxin is accomplished by injecting a small amount of toxin into the muscles of the neck and head. Dosage must be titrated for each individual. Any benefits resulting from botulism toxin tend to wear off after 3 months with a repeat injection required if benefit is to be maintained. Injections are usually done every 3-4 months with maximum effect peak achieved by about 2 or 3 weeks. Botulism toxin is expensive and you should be sure of what costs you will incur resulting from the injection.  The side effects of botulism toxin use for chronic migraine may include:   -Transient, and usually mild, facial weakness with facial injections  -Transient, and usually mild, head or neck weakness with head/neck injections  -Reduction or loss of forehead facial animation due to forehead muscle weakness  -Eyelid drooping  -Dry eye  -Pain at the site of injection or bruising at the site of injection  -Double vision  -Potential unknown long term risks  Contraindications: You should not have Botox if you are pregnant, nursing, allergic to albumin, have an infection, skin condition, or muscle weakness at the site of the injection, or have myasthenia gravis, Lambert-Eaton syndrome, or ALS.  It is also possible that as with any injection, there may be an allergic reaction or no effect from the medication. Reduced effectiveness after repeated injections is sometimes seen and rarely infection at the injection site may occur. All care will be taken to prevent these side effects. If therapy is given over a long time, atrophy and wasting in the muscle injected may occur. Occasionally the patient's become refractory to treatment because  they develop antibodies to the toxin. In this event, therapy needs to be modified.  I have read the above information and consent to the administration of botulism toxin.    ______________  _____   _________________  Patient signature     Date   Witness signature       BOTOX PROCEDURE NOTE FOR MIGRAINE HEADACHE    Contraindications and precautions discussed with patient(above). Aseptic procedure was observed and patient tolerated procedure. Procedure performed by Dr. Georgia Dom  The condition has existed for more than 6 months, and pt does not have a diagnosis of ALS, Myasthenia Gravis or Lambert-Eaton Syndrome. Risks and benefits of injections discussed and pt agrees to proceed with the procedure. Written consent obtained  These injections are medically necessary.  These injections do not cause sedations or hallucinations which the oral therapies may cause.  Indication/Diagnosis: chronic migraine BOTOX(J0585) injection was performed according to protocol by [employer]. 200 units of BOTOX was dissolved into 4 cc NS.  NDC: [id number]  Botox -100 unitsx2 vial Lot: [id number] Expiration: 02/2018 [id number]  0.9% Sodium Chloride- 28mL total [id number] Expiration: 12/2016 NDC: [id number]   Description of procedure:  The patient was placed in a sitting position. The standard protocol was used for Botox as follows, with 5 units of Botox injected at each site:   -Procerus muscle, midline injection  -Corrugator muscle, bilateral injection  -Frontalis muscle, bilateral injection, with 2 sites each side, medial injection was performed in the upper one third of the frontalis muscle, in the region vertical from the medial inferior edge of the superior orbital rim. The lateral injection was  again in the upper one third of the forehead vertically above the lateral limbus of the cornea, 1.5 cm lateral to the medial injection site.  -Temporalis muscle injection, 4 sites,  bilaterally. The first injection was 3 cm above the tragus of the ear, second injection site was 1.5 cm to 3 cm up from the first injection site in line with the tragus of the ear. The third injection site was 1.5-3 cm forward between the first 2 injection sites. The fourth injection site was 1.5 cm posterior to the second injection site.  -Occipitalis muscle injection, 3 sites, bilaterally. The first injection was done one half way between the occipital protuberance and the tip of the mastoid process behind the ear. The second injection site was done lateral and superior to the first, 1 fingerbreadth from the first injection. The third injection site was 1 fingerbreadth superiorly and medially from the first injection site.  -Cervical paraspinal muscle injection, 2 sites, bilateral knee first injection site was 1 cm from the midline of the cervical spine, 3 cm inferior to the lower border of the occipital protuberance. The second injection site was 1.5 cm superiorly and laterally to the first injection site.  -Trapezius muscle injection was performed at 3 sites, bilaterally. The first injection site was in the upper trapezius muscle halfway between the inflection point of the neck, and the acromion. The second injection site was one half way between the acromion and the first injection site. The third injection was done between the first injection site and the inflection point of the neck. 10 units were injected at each site.   Will return for repeat injection in 3 months.   A 200 unit sof Botox was used, 185 units were injected, the rest of the Botox was wasted. The patient tolerated the procedure well, there were no complications of the above procedure.

## 2015-07-21 ENCOUNTER — Ambulatory Visit: Payer: 59 | Admitting: Obstetrics & Gynecology

## 2015-07-21 DIAGNOSIS — H00035 Abscess of left lower eyelid: Secondary | ICD-10-CM | POA: Diagnosis not present

## 2015-07-21 MED FILL — TOBRADEX EYE OINTMENT: 0.3-0.1 | 7 days supply | Qty: 4 | Fill #0

## 2015-07-21 MED FILL — SULFAMETHOXAZOLE/TMP DS TAB: 800-160 | 10 days supply | Qty: 20 | Fill #0

## 2015-07-28 ENCOUNTER — Encounter: Payer: Self-pay | Admitting: Obstetrics & Gynecology

## 2015-07-28 ENCOUNTER — Ambulatory Visit (INDEPENDENT_AMBULATORY_CARE_PROVIDER_SITE_OTHER): Payer: 59 | Admitting: Obstetrics & Gynecology

## 2015-07-28 VITALS — BP 120/74 | HR 78 | Resp 14 | Ht 63.0 in | Wt 174.4 lb

## 2015-07-28 DIAGNOSIS — A09 Infectious gastroenteritis and colitis, unspecified: Secondary | ICD-10-CM | POA: Diagnosis not present

## 2015-07-28 DIAGNOSIS — R197 Diarrhea, unspecified: Secondary | ICD-10-CM

## 2015-07-28 LAB — CBC
HCT: 39.7 % (ref 35.0–45.0)
Hemoglobin: 12.7 g/dL (ref 11.7–15.5)
MCH: 26.8 pg — ABNORMAL LOW (ref 27.0–33.0)
MCHC: 32 g/dL (ref 32.0–36.0)
MCV: 83.9 fL (ref 80.0–100.0)
MPV: 10.5 fL (ref 7.5–12.5)
Platelets: 270 10*3/uL (ref 140–400)
RBC: 4.73 MIL/uL (ref 3.80–5.10)
RDW: 13.5 % (ref 11.0–15.0)
WBC: 5.4 10*3/uL (ref 3.8–10.8)

## 2015-07-28 LAB — HEPATIC FUNCTION PANEL
ALT: 21 U/L (ref 6–29)
AST: 17 U/L (ref 10–30)
Albumin: 3.7 g/dL (ref 3.6–5.1)
Alkaline Phosphatase: 46 U/L (ref 33–115)
Bilirubin, Direct: 0.1 mg/dL (ref <=0.2)
Indirect Bilirubin: 0.3 mg/dL (ref 0.2–1.2)
Total Bilirubin: 0.4 mg/dL (ref 0.2–1.2)
Total Protein: 6.2 g/dL (ref 6.1–8.1)

## 2015-07-28 NOTE — Progress Notes (Signed)
GYNECOLOGY  VISIT   HPI: 30 y.o. G1P1 Married Caucasian female with complaint of abdominal distension and pain that started after eating [place] on Monday night.  Pt had both diarrhea and emesis.  Diarrhea was not bloody.  Pt didn't really eat much on Tuesday but had dinner as she was feeling better.  Pt had diarrhea and emesis again on Tuesday night.  Pt had bloating and increase gas sensation on Wednesday and had diarrhea again.  She took immodium in the evening.  She did not really eat anything on Wed or Thursday.  She had mashed potatoes and gravy last night and had some diarrhea yesterday evening but no nausea.  She'd had nausea the previous three days.  She denies fever or chills.  She has not taken her temperature.  Now, she reports her abdomen is just tender feeling.  This is up high.  Cycles are normal.  LMP 07/07/15.    GYNECOLOGIC HISTORY: Patient's last menstrual period was 07/07/2015. Contraception: loloestrin  Patient Active Problem List   Diagnosis Date Noted  . Chronic migraine without aura without status migrainosus, not intractable 06/10/2015  . Sjogren's syndrome ([place])   . Migraines   . Anxiety     Past Medical History  Diagnosis Date  . Sjogren's disease ([place])   . Migraines   . Anxiety   . ADD (attention deficit disorder with hyperactivity)   . Family history of anesthesia complication     N&V    Past Surgical History  Procedure Laterality Date  . Wisdom tooth extraction Bilateral 2008  . Hysteroscopy N/A 10/15/2013    Procedure: HYSTEROSCOPY with removal of IUD;  Surgeon: Lyman Speller, MD;  Location: [facility];  Service: Gynecology;  Laterality: N/A;  . Breast enhancement surgery  2013    Dr. Lenon Curt    MEDS:  Reviewed in EPIC and UTD  ALLERGIES: Penicillins  Family History  Problem Relation Age of Onset  . Sarcoidosis Mother   . Heart disease Maternal Grandfather   . Diabetes Paternal Grandfather   . Heart disease Paternal Grandfather     SH:   Married, non smoker  ROS  PHYSICAL EXAMINATION:    BP 120/74 mmHg  Pulse 78  Resp 14  Ht 5\' 3"  (1.6 m)  Wt 174 lb 6.4 oz (79.107 kg)  BMI 30.90 kg/m2  LMP 07/07/2015    Physical Exam  Constitutional: She is oriented to person, place, and time. She appears well-developed and well-nourished.  GI: Soft. Bowel sounds are normal. She exhibits no distension and no mass. There is tenderness (RLQ tender to deep palpation). There is no rebound and no guarding.  Neurological: She is alert and oriented to person, place, and time.  Skin: Skin is warm and dry.  Psychiatric: She has a normal mood and affect.    Assessment: Nausea/vomiting/diarrhea after eating [place] food on Monday night.  Followed by nausea x 3 days. Imodium use about 36 hours ago  Plan: Reviewed Up To Date recommendations regarding possible food poisoning.  No empiric antibiotics recommended.  Stool culture ordered. CBC and Liver panel pending. Precautions for going to the ER given

## 2015-08-03 DIAGNOSIS — S63501A Unspecified sprain of right wrist, initial encounter: Secondary | ICD-10-CM | POA: Diagnosis not present

## 2015-08-30 DIAGNOSIS — S40012D Contusion of left shoulder, subsequent encounter: Secondary | ICD-10-CM | POA: Diagnosis not present

## 2015-08-30 DIAGNOSIS — S63501D Unspecified sprain of right wrist, subsequent encounter: Secondary | ICD-10-CM | POA: Diagnosis not present

## 2015-09-04 MED FILL — LO LOESTRIN FE 1-10 TABLET: 1 MG-10 MCG | 84 days supply | Qty: 84 | Fill #1

## 2015-09-14 ENCOUNTER — Other Ambulatory Visit: Payer: Self-pay | Admitting: Obstetrics & Gynecology

## 2015-09-14 MED ORDER — SULFAMETHOXAZOLE-TRIMETHOPRIM 800-160 MG PO TABS: 1.0000 | ORAL_TABLET | Freq: Two times a day (BID) | ORAL | 0 refills | Status: DC

## 2015-09-14 MED FILL — SULFAMETHOXAZOLE/TMP DS TAB: 800-160 | 7 days supply | Qty: 14 | Fill #0

## 2015-09-14 NOTE — Progress Notes (Signed)
Pt has hx of left eye cellulitis.  Pt seen by ophthalmologist at Dr. [place] with this diagnosis.  Pt having recurrence of this with increased lower eye swelling.  Cannot get into above office today.  Was treated with bactrim DS.  Will send in rx and if doesn't improve, she will follow up at Dr. Coral Else office.

## 2015-10-11 MED FILL — BOTOX 100 UNITS VIAL: 100 | 30 days supply | Qty: 2 | Fill #1

## 2015-10-12 ENCOUNTER — Ambulatory Visit (INDEPENDENT_AMBULATORY_CARE_PROVIDER_SITE_OTHER): Payer: 59 | Admitting: Neurology

## 2015-10-12 ENCOUNTER — Telehealth: Payer: Self-pay | Admitting: Neurology

## 2015-10-12 ENCOUNTER — Ambulatory Visit: Payer: 59 | Admitting: Neurology

## 2015-10-12 VITALS — BP 138/88 | HR 71

## 2015-10-12 DIAGNOSIS — G43709 Chronic migraine without aura, not intractable, without status migrainosus: Secondary | ICD-10-CM | POA: Diagnosis not present

## 2015-10-12 NOTE — Telephone Encounter (Signed)
Patient called, was here today for BOTOX with Dr. Jaynee Eagles, has migraine, onset this morning around 8am that has gotten progressively worse, has taken Ibuprofen an hour ago, asks if she can take something else? Please call to advise [phone].

## 2015-10-12 NOTE — Progress Notes (Signed)
Consent Form Botulism Toxin Injection For Chronic Migraine  Botulism toxin has been approved by the Federal drug administration for treatment of chronic migraine. Botulism toxin does not cure chronic migraine and it may not be effective in some patients.  The administration of botulism toxin is accomplished by injecting a small amount of toxin into the muscles of the neck and head. Dosage must be titrated for each individual. Any benefits resulting from botulism toxin tend to wear off after 3 months with a repeat injection required if benefit is to be maintained. Injections are usually done every 3-4 months with maximum effect peak achieved by about 2 or 3 weeks. Botulism toxin is expensive and you should be sure of what costs you will incur resulting from the injection.  The side effects of botulism toxin use for chronic migraine may include:   -Transient, and usually mild, facial weakness with facial injections  -Transient, and usually mild, head or neck weakness with head/neck injections  -Reduction or loss of forehead facial animation due to forehead muscle              weakness  -Eyelid drooping  -Dry eye  -Pain at the site of injection or bruising at the site of injection  -Double vision  -Potential unknown long term risks  Contraindications: You should not have Botox if you are pregnant, nursing, allergic to albumin, have an infection, skin condition, or muscle weakness at the site of the injection, or have myasthenia gravis, Lambert-Eaton syndrome, or ALS.  It is also possible that as with any injection, there may be an allergic reaction or no effect from the medication. Reduced effectiveness after repeated injections is sometimes seen and rarely infection at the injection site may occur. All care will be taken to prevent these side effects. If therapy is given over a long time, atrophy and wasting in the muscle injected may occur. Occasionally the patient's become refractory to  treatment because they develop antibodies to the toxin. In this event, therapy needs to be modified.  I have read the above information and consent to the administration of botulism toxin.    ______________  _____   _________________  Patient signature     Date   Witness signature       BOTOX PROCEDURE NOTE FOR MIGRAINE HEADACHE    Contraindications and precautions discussed with patient(above). Aseptic procedure was observed and patient tolerated procedure. Procedure performed by Dr. Georgia Dom  The condition has existed for more than 6 months, and pt does not have a diagnosis of ALS, Myasthenia Gravis or Lambert-Eaton Syndrome. Risks and benefits of injections discussed and pt agrees to proceed with the procedure. Written consent obtained  These injections are medically necessary. He receives good benefits from these injections. These injections do not cause sedations or hallucinations which the oral therapies may cause.  Indication/Diagnosis: chronic migraine BOTOX(J0585) injection was performed according to protocol by [employer]. 200 units of BOTOX was dissolved into 4 cc NS.  NDC: [id number]  Botox-100unitsx2 vials Lot: [id number] Expiration: 03/2018 [id number] NDC: [id number]  0.9% Sodium Chloride- 1mL total [id number] Expiration: 06/2017 NDC: [id number]   Description of procedure:  The patient was placed in a sitting position. The standard protocol was used for Botox as follows, with 5 units of Botox injected at each site:   -Procerus muscle, midline injection  -Corrugator muscle, bilateral injection  -Frontalis muscle, bilateral injection, with 2 sites each side, medial injection was performed in the upper one third of the frontalis  muscle, in the region vertical from the medial inferior edge of the superior orbital rim. The lateral injection was again in the upper one third of the forehead vertically above the lateral limbus of the cornea, 1.5 cm  lateral to the medial injection site.  -Temporalis muscle injection, 4 sites, bilaterally. The first injection was 3 cm above the tragus of the ear, second injection site was 1.5 cm to 3 cm up from the first injection site in line with the tragus of the ear. The third injection site was 1.5-3 cm forward between the first 2 injection sites. The fourth injection site was 1.5 cm posterior to the second injection site.  -Occipitalis muscle injection, 3 sites, bilaterally. The first injection was done one half way between the occipital protuberance and the tip of the mastoid process behind the ear. The second injection site was done lateral and superior to the first, 1 fingerbreadth from the first injection. The third injection site was 1 fingerbreadth superiorly and medially from the first injection site.  -Cervical paraspinal muscle injection, 2 sites, bilateral knee first injection site was 1 cm from the midline of the cervical spine, 3 cm inferior to the lower border of the occipital protuberance. The second injection site was 1.5 cm superiorly and laterally to the first injection site.  -Trapezius muscle injection was performed at 3 sites, bilaterally. The first injection site was in the upper trapezius muscle halfway between the inflection point of the neck, and the acromion. The second injection site was one half way between the acromion and the first injection site. The third injection was done between the first injection site and the inflection point of the neck.  Additional injections:2 units bilaterally above supraorbital ridge and 2 units lateral above the brow bilaterally , and 32 additional units in traps and paraspinals bilaterally   Will return for repeat injection in 3 months.   A 200 unit sof Botox was used, 195 units were injected, the rest of the Botox was wasted. The patient tolerated the procedure well, there were no complications of the above procedure.

## 2015-10-12 NOTE — Telephone Encounter (Signed)
Called and spoke to patient. She stated she has not taken relpax or toradol injection. The relpax causes her to have to lay down after she takes it and she is at work. She stated it makes her feel funny. She does not have a toradol injection with her to take. She used the one she had kept at work. The rest are at home. She is leaving work in 39 min. She wanted to know if she can take tylenol and if this doesn't help, if she can then take toradol. I placed patient on hold and spoke to Dr Jaynee Eagles while on the phone w/ patient. Dr Jaynee Eagles stated this would be okay.   I relayed Dr Jaynee Eagles that she can take tylenol and if that doesn't help she can take toradol. She verbalized understanding. Advised her to call first thing tomorrow if she is not better and we can fit her in. She verbalized understanding.

## 2015-10-12 NOTE — Progress Notes (Signed)
Botox-100unitsx2 vials Lot: [id number] Expiration: 03/2018 [id number] NDC: [id number]  0.9% Sodium Chloride- 35mL total [id number] Expiration: 06/2017 NDC: [id number]

## 2015-10-23 ENCOUNTER — Ambulatory Visit (INDEPENDENT_AMBULATORY_CARE_PROVIDER_SITE_OTHER): Payer: 59 | Admitting: Obstetrics & Gynecology

## 2015-10-23 ENCOUNTER — Encounter: Payer: Self-pay | Admitting: Obstetrics & Gynecology

## 2015-10-23 VITALS — BP 120/78 | HR 80 | Resp 14 | Ht 63.0 in | Wt 177.6 lb

## 2015-10-23 DIAGNOSIS — E668 Other obesity: Secondary | ICD-10-CM

## 2015-10-23 NOTE — Progress Notes (Signed)
Pt will be rescheduled to tomorrow due to need to pick up child.

## 2015-10-24 ENCOUNTER — Ambulatory Visit (INDEPENDENT_AMBULATORY_CARE_PROVIDER_SITE_OTHER): Payer: 59 | Admitting: Obstetrics & Gynecology

## 2015-10-24 ENCOUNTER — Encounter: Payer: Self-pay | Admitting: Obstetrics & Gynecology

## 2015-10-24 DIAGNOSIS — IMO0002 Reserved for concepts with insufficient information to code with codable children: Secondary | ICD-10-CM | POA: Insufficient documentation

## 2015-10-24 DIAGNOSIS — E668 Other obesity: Secondary | ICD-10-CM | POA: Diagnosis not present

## 2015-10-24 MED ORDER — NALTREXONE-BUPROPION HCL ER 8-90 MG PO TB12: ORAL_TABLET | ORAL | 0 refills | Status: DC

## 2015-10-24 MED ORDER — ALPRAZOLAM 0.5 MG PO TABS: ORAL_TABLET | ORAL | 0 refills | Status: DC

## 2015-10-24 NOTE — Progress Notes (Signed)
GYNECOLOGY  VISIT   HPI: 30 y.o. G1P1 Married Caucasian female with frustration with weight and BMI > 30 here to discuss options for weight medication.  Feels like making good food choices and working out regularly without any success.  Has had normal TSH 4/17.  Options reviewed.  Will start with Contrave.  Side effects and risks discussed.  She has no contraindications to the medication.  She is traveling to [place] in about four weeks and needs Xanax RF that she uses when traveling.  This will be given today as well.     GYNECOLOGIC HISTORY: Patient's last menstrual period was 10/09/2015 (approximate). Contraception:  OCPs  Patient Active Problem List   Diagnosis Date Noted  . Chronic migraine without aura without status migrainosus, not intractable 06/10/2015  . Sjogren's syndrome ([place])   . Migraines   . Anxiety     Past Medical History:  Diagnosis Date  . ADD (attention deficit disorder with hyperactivity)   . Anxiety   . Family history of anesthesia complication    N&V  . Migraines   . Sjogren's disease [facility])     Past Surgical History:  Procedure Laterality Date  . BREAST ENHANCEMENT SURGERY  2013   Dr. Lenon Curt  . HYSTEROSCOPY N/A 10/15/2013   Procedure: HYSTEROSCOPY with removal of IUD;  Surgeon: Lyman Speller, MD;  Location: [facility];  Service: Gynecology;  Laterality: N/A;  . WISDOM TOOTH EXTRACTION Bilateral 2008    MEDS:  Reviewed in EPIC and UTD  ALLERGIES: Penicillins  Family History  Problem Relation Age of Onset  . Sarcoidosis Mother   . Heart disease Maternal Grandfather   . Diabetes Paternal Grandfather   . Heart disease Paternal Grandfather     SH:  Married, non smoker  ROS  PHYSICAL EXAMINATION:   BP:  120/78, R: 14, P:80, Wt: 177#, BMI 31.46 General appearance: alert, cooperative and appears stated age No other exam performed.   Assessment: Obesity with BMI > 30  Plan: Contrave will be initiated. Pt understands how to titrate up  dosage.  Recheck 1 month.  Pt will titrate more slowly due to upcoming trip. Xanax RF for 0.5mg  po q 8 hrs prn anxiety, #30/0RF given   ~15 minutes spent with patient >50% of time was in face to face discussion of above.

## 2015-11-03 MED FILL — ALPRAZolam 0.5 MG TABS: 0.5 | 10 days supply | Qty: 30 | Fill #0

## 2015-11-03 MED FILL — CONTRAVE ER 8-90 MG TABLET: 8-90 | 28 days supply | Qty: 70 | Fill #0

## 2015-11-03 MED FILL — LO LOESTRIN FE 1-10 TABLET: 1 MG-10 MCG | 84 days supply | Qty: 84 | Fill #2

## 2015-12-13 ENCOUNTER — Telehealth: Payer: Self-pay | Admitting: *Deleted

## 2015-12-13 NOTE — Telephone Encounter (Signed)
Patient states she was unable to take Contrave due to excessive sleepiness. Only took it for a week before had to discontinue. Side effects did not outweigh benefit. Interested in another option. Has taken Phentermine in the past with success.  Did not schedule follow-up office visit since only took Contave for one week.    Please advise.

## 2015-12-18 NOTE — Telephone Encounter (Signed)
Spoke with pt personally.  Will make plan for change therapy.  Pt aware I am out of town for next three days.  Ok to close encounter.

## 2016-01-01 ENCOUNTER — Other Ambulatory Visit: Payer: Self-pay | Admitting: Obstetrics & Gynecology

## 2016-01-01 MED ORDER — AZITHROMYCIN 250 MG PO TABS: ORAL_TABLET | ORAL | 0 refills | Status: DC

## 2016-01-01 MED ORDER — BENZONATATE 100 MG PO CAPS
100.0000 mg | ORAL_CAPSULE | Freq: Three times a day (TID) | ORAL | 0 refills | Status: DC
Start: 2016-01-01 — End: 2016-01-18

## 2016-01-01 MED FILL — BENZONATATE 100 MG CAPSULE: 100 | 10 days supply | Qty: 30 | Fill #0

## 2016-01-01 MED FILL — AZITHROMYCIN 250 MG TABLET: 250 | 5 days supply | Qty: 6 | Fill #0

## 2016-01-15 MED FILL — BOTOX 100 UNITS VIAL: 100 | 30 days supply | Qty: 2 | Fill #2

## 2016-01-18 ENCOUNTER — Encounter: Payer: Self-pay | Admitting: *Deleted

## 2016-01-18 ENCOUNTER — Ambulatory Visit (INDEPENDENT_AMBULATORY_CARE_PROVIDER_SITE_OTHER): Payer: 59 | Admitting: Neurology

## 2016-01-18 VITALS — BP 135/92 | HR 72 | Ht 63.0 in

## 2016-01-18 DIAGNOSIS — G43709 Chronic migraine without aura, not intractable, without status migrainosus: Secondary | ICD-10-CM

## 2016-01-18 MED ORDER — ALMOTRIPTAN MALATE 6.25 MG PO TABS: 6.2500 mg | ORAL_TABLET | Freq: Once | ORAL | 12 refills | Status: DC | PRN

## 2016-01-18 MED ORDER — DICLOFENAC POTASSIUM(MIGRAINE) 50 MG PO PACK: 50.0000 mg | PACK | Freq: Once | ORAL | 11 refills | Status: DC | PRN

## 2016-01-18 MED FILL — ALMOTRIPTAN MALATE 6.25 MG: 6.25 | 30 days supply | Qty: 9 | Fill #0

## 2016-01-18 NOTE — Progress Notes (Signed)
Faxed [employer] enrollment form to [facility]. Fax: [phone]. Received confirmation

## 2016-01-18 NOTE — Progress Notes (Signed)
Improved from 20 headache days to 10 headache days a month. 5 are migraines and less severe than pre-botox. Tried zomig nasal (burned), onzetra and imitrex did not work well, Topiramate(side effects to the long acting, but the IR worked well), Imitrex (felt hot and dizzy), Maxalt, Relpax, Lexapro, naproxen, Toradol, Zofran, Phenergan, Zanaflex, ibuprofen, toradol, naproxen pill form. Has not tried cambia. Relpax makes her feel foggy and tired.    Consent Form Botulism Toxin Injection For Chronic Migraine  Botulism toxin has been approved by the Federal drug administration for treatment of chronic migraine. Botulism toxin does not cure chronic migraine and it may not be effective in some patients.  The administration of botulism toxin is accomplished by injecting a small amount of toxin into the muscles of the neck and head. Dosage must be titrated for each individual. Any benefits resulting from botulism toxin tend to wear off after 3 months with a repeat injection required if benefit is to be maintained. Injections are usually done every 3-4 months with maximum effect peak achieved by about 2 or 3 weeks. Botulism toxin is expensive and you should be sure of what costs you will incur resulting from the injection.  The side effects of botulism toxin use for chronic migraine may include:   -Transient, and usually mild, facial weakness with facial injections  -Transient, and usually mild, head or neck weakness with head/neck injections  -Reduction or loss of forehead facial animation due to forehead muscle              weakness  -Eyelid drooping  -Dry eye  -Pain at the site of injection or bruising at the site of injection  -Double vision  -Potential unknown long term risks  Contraindications: You should not have Botox if you are pregnant, nursing, allergic to albumin, have an infection, skin condition, or muscle weakness at the site of the injection, or have myasthenia gravis, Lambert-Eaton  syndrome, or ALS.  It is also possible that as with any injection, there may be an allergic reaction or no effect from the medication. Reduced effectiveness after repeated injections is sometimes seen and rarely infection at the injection site may occur. All care will be taken to prevent these side effects. If therapy is given over a long time, atrophy and wasting in the muscle injected may occur. Occasionally the patient's become refractory to treatment because they develop antibodies to the toxin. In this event, therapy needs to be modified.  I have read the above information and consent to the administration of botulism toxin.    ______________  _____   _________________  Patient signature     Date   Witness signature       BOTOX PROCEDURE NOTE FOR MIGRAINE HEADACHE    Contraindications and precautions discussed with patient(above). Aseptic procedure was observed and patient tolerated procedure. Procedure performed by Dr. Georgia Dom  The condition has existed for more than 6 months, and pt does not have a diagnosis of ALS, Myasthenia Gravis or Lambert-Eaton Syndrome. Risks and benefits of injections discussed and pt agrees to proceed with the procedure. Written consent obtained  These injections are medically necessary. He receives good benefits from these injections. These injections do not cause sedations or hallucinations which the oral therapies may cause.  Indication/Diagnosis: chronic migraine BOTOX(J0585) injection was performed according to protocol by [employer]. 200 units of BOTOX was dissolved into 4 cc NS.  NDC: [id number]  Botox-100unitsx2 vials Lot: [id number] Expiration: 07/2018 NDC: [id number]  0.9%  Sodium Chloride bacteriostatic- 1mL total Lot: [id number] Expiration: 07/05/2017 NDC: [id number]  Dx: [id number] [facility] pharmacy   Description of procedure:  The patient was placed in a sitting position. The standard protocol was used for  Botox as follows, with 5 units of Botox injected at each site:   -Procerus muscle, midline injection  -Corrugator muscle, bilateral injection  -Frontalis muscle, bilateral injection, with 2 sites each side, medial injection was performed in the upper one third of the frontalis muscle, in the region vertical from the medial inferior edge of the superior orbital rim. The lateral injection was again in the upper one third of the forehead vertically above the lateral limbus of the cornea, 1.5 cm lateral to the medial injection site.  -Temporalis muscle injection, 4 sites, bilaterally. The first injection was 3 cm above the tragus of the ear, second injection site was 1.5 cm to 3 cm up from the first injection site in line with the tragus of the ear. The third injection site was 1.5-3 cm forward between the first 2 injection sites. The fourth injection site was 1.5 cm posterior to the second injection site.  -Occipitalis muscle injection, 3 sites, bilaterally. The first injection was done one half way between the occipital protuberance and the tip of the mastoid process behind the ear. The second injection site was done lateral and superior to the first, 1 fingerbreadth from the first injection. The third injection site was 1 fingerbreadth superiorly and medially from the first injection site.  -Cervical paraspinal muscle injection, 2 sites, bilateral knee first injection site was 1 cm from the midline of the cervical spine, 3 cm inferior to the lower border of the occipital protuberance. The second injection site was 1.5 cm superiorly and laterally to the first injection site.  -Trapezius muscle injection was performed at 3 sites, bilaterally. The first injection site was in the upper trapezius muscle halfway between the inflection point of the neck, and the acromion. The second injection site was one half way between the acromion and the first injection site. The third injection was done between the  first injection site and the inflection point of the neck.   Will return for repeat injection in 3 months.   A 200 unit sof Botox was used, 155 units were injected, the rest of the Botox was wasted. The patient tolerated the procedure well, there were no complications of the above procedure.

## 2016-01-18 NOTE — Progress Notes (Signed)
Botox-100unitsx2 vials Lot: [id number] Expiration: 07/2018 NDC: [id number]  0.9% Sodium Chloride bacteriostatic- 35mL total Lot: [id number] Expiration: 07/05/2017 NDC: [id number]  Dx: [id number] [facility] pharmacy

## 2016-02-05 HISTORY — PX: KIDNEY STONE SURGERY: SHX686

## 2016-02-05 NOTE — L&D Delivery Note (Signed)
Delivery Note At 7:14 PM a viable and healthy female was delivered via Vaginal, Spontaneous Delivery (Presentation: LOA).  APGAR: 9, 9; weight  pending.   Placenta status: spontaneous, intact.  Cord:  with the following complications: none.  Cord pH: na  Anesthesia:  epidural Episiotomy: None Lacerations: 2nd degree Suture Repair: 3.0 vicryl rapide Est. Blood Loss (mL): 300  Mom to postpartum.  Baby to Couplet care / Skin to Skin.  TAAVON,RICHARD J 12/06/2016, 7:28 PM

## 2016-02-12 NOTE — Progress Notes (Signed)
LVM for pt advising we received fax notification that Teresa Holden has been trying to reach her to set up delivery for rx cambia. Gave her phone number [phone] to call and set this up. Gave [facility] phone number if she has further questions.

## 2016-02-14 ENCOUNTER — Other Ambulatory Visit (INDEPENDENT_AMBULATORY_CARE_PROVIDER_SITE_OTHER): Payer: 59

## 2016-02-14 ENCOUNTER — Telehealth: Payer: Self-pay | Admitting: *Deleted

## 2016-02-14 DIAGNOSIS — R1011 Right upper quadrant pain: Secondary | ICD-10-CM

## 2016-02-14 DIAGNOSIS — Z0000 Encounter for general adult medical examination without abnormal findings: Secondary | ICD-10-CM

## 2016-02-14 LAB — CBC
HCT: 38 % (ref 35.0–45.0)
Hemoglobin: 12.5 g/dL (ref 11.7–15.5)
MCH: 27.5 pg (ref 27.0–33.0)
MCHC: 32.9 g/dL (ref 32.0–36.0)
MCV: 83.5 fL (ref 80.0–100.0)
MPV: 10.5 fL (ref 7.5–12.5)
Platelets: 273 10*3/uL (ref 140–400)
RBC: 4.55 MIL/uL (ref 3.80–5.10)
RDW: 13.7 % (ref 11.0–15.0)
WBC: 8.7 10*3/uL (ref 3.8–10.8)

## 2016-02-14 NOTE — Telephone Encounter (Signed)
RUQ ultrasound scheduled for first available appointment at [facility] on 02-19-16 at 1100. Patient notified.

## 2016-02-14 NOTE — Telephone Encounter (Signed)
Reviewed with Dr Sabra Heck.  Gradual increase in RUQ pain over last week.  Pain is significantly worse since last night, particularly after eating. Now also associated with nausea. Reports pain sharp in right upper abdomen and radiates to back behind left shoulder.Again emphasizes this is worse after eating and she denies any chest pain of cardiac nature. She is advised of atypical presentations of chest pain and advised ED is always an option. Also denies SOB, increased pain with exertion, palpitations, arm or jaw pain, dizziness or sweating. History of acid reflux and takes Nexium. Has taken OTC Gas X without relief. Patient requested appointment with Dr Collene Mares (Gastroenterologist) which was scheduled for tomorrow at 0830.   All symptoms reviewed with Dr Sabra Heck by phone. Orders received. Patient is aware to keep appointment with Dr Collene Mares tomorrow but NPO after midnight. She is advised to proceed to ED for increased pain or change in symptoms.    Routing to provider for final review. Patient agreeable to disposition. Will close encounter.

## 2016-02-15 DIAGNOSIS — R140 Abdominal distension (gaseous): Secondary | ICD-10-CM | POA: Diagnosis not present

## 2016-02-15 DIAGNOSIS — R194 Change in bowel habit: Secondary | ICD-10-CM | POA: Diagnosis not present

## 2016-02-15 DIAGNOSIS — R1011 Right upper quadrant pain: Secondary | ICD-10-CM | POA: Diagnosis not present

## 2016-02-15 DIAGNOSIS — K219 Gastro-esophageal reflux disease without esophagitis: Secondary | ICD-10-CM | POA: Diagnosis not present

## 2016-02-15 LAB — BILIRUBIN,DIRECT & INDIRECT (FRACTIONATED)
Bilirubin, Direct: 0.1 mg/dL (ref <=0.2)
Indirect Bilirubin: 0.2 mg/dL (ref 0.2–1.2)

## 2016-02-15 LAB — AMYLASE: Amylase: 39 U/L (ref 0–105)

## 2016-02-15 LAB — HEPATIC FUNCTION PANEL
ALT: 20 U/L (ref 6–29)
AST: 17 U/L (ref 10–30)
Albumin: 3.8 g/dL (ref 3.6–5.1)
Alkaline Phosphatase: 39 U/L (ref 33–115)
Bilirubin, Direct: 0.1 mg/dL (ref <=0.2)
Indirect Bilirubin: 0.2 mg/dL (ref 0.2–1.2)
Total Bilirubin: 0.3 mg/dL (ref 0.2–1.2)
Total Protein: 6.4 g/dL (ref 6.1–8.1)

## 2016-02-15 LAB — BILIRUBIN, TOTAL: Total Bilirubin: 0.3 mg/dL (ref 0.2–1.2)

## 2016-02-15 LAB — LIPASE: Lipase: 27 U/L (ref 7–60)

## 2016-02-15 LAB — BILIRUBIN, DIRECT: Bilirubin, Direct: 0.1 mg/dL (ref <=0.2)

## 2016-02-15 MED FILL — ESOMEPRAZOLE MAG DR 40 MG C: 40 | 30 days supply | Qty: 30 | Fill #0

## 2016-02-19 ENCOUNTER — Ambulatory Visit
Admission: RE | Admit: 2016-02-19 | Discharge: 2016-02-19 | Disposition: A | Discharge status: Home / Self Care | Payer: 59 | Source: Ambulatory Visit | Attending: Obstetrics & Gynecology | Admitting: Obstetrics & Gynecology

## 2016-02-19 DIAGNOSIS — R1011 Right upper quadrant pain: Secondary | ICD-10-CM

## 2016-02-19 DIAGNOSIS — K7689 Other specified diseases of liver: Secondary | ICD-10-CM | POA: Diagnosis not present

## 2016-02-20 ENCOUNTER — Other Ambulatory Visit: Payer: Self-pay | Admitting: Gastroenterology

## 2016-02-20 DIAGNOSIS — R1011 Right upper quadrant pain: Secondary | ICD-10-CM

## 2016-02-20 MED FILL — LO LOESTRIN FE 1-10 TABLET: 1 MG-10 MCG | 84 days supply | Qty: 84 | Fill #3

## 2016-02-26 ENCOUNTER — Encounter (HOSPITAL_COMMUNITY)
Admission: RE | Admit: 2016-02-26 | Discharge: 2016-02-26 | Disposition: A | Discharge status: Home / Self Care | Payer: 59 | Source: Ambulatory Visit | Attending: Gastroenterology | Admitting: Gastroenterology

## 2016-02-26 DIAGNOSIS — R1011 Right upper quadrant pain: Secondary | ICD-10-CM | POA: Diagnosis not present

## 2016-02-26 LAB — HCG, SERUM, QUALITATIVE: Preg, Serum: NEGATIVE

## 2016-02-26 MED ORDER — TECHNETIUM TC 99M MEBROFENIN IV KIT
5.0000 | PACK | Freq: Once | INTRAVENOUS | Status: AC | PRN
  Administered 2016-02-26: 5 via INTRAVENOUS

## 2016-02-27 MED FILL — ONDANSETRON HCL 8 MG TABLET: 8 | 30 days supply | Qty: 90 | Fill #0

## 2016-03-01 DIAGNOSIS — R1011 Right upper quadrant pain: Secondary | ICD-10-CM | POA: Diagnosis not present

## 2016-03-01 DIAGNOSIS — K219 Gastro-esophageal reflux disease without esophagitis: Secondary | ICD-10-CM | POA: Diagnosis not present

## 2016-03-01 DIAGNOSIS — K828 Other specified diseases of gallbladder: Secondary | ICD-10-CM | POA: Diagnosis not present

## 2016-03-05 ENCOUNTER — Other Ambulatory Visit: Payer: Self-pay | Admitting: Surgery

## 2016-03-05 DIAGNOSIS — K828 Other specified diseases of gallbladder: Secondary | ICD-10-CM | POA: Diagnosis not present

## 2016-03-06 ENCOUNTER — Encounter (HOSPITAL_BASED_OUTPATIENT_CLINIC_OR_DEPARTMENT_OTHER): Payer: Self-pay | Admitting: *Deleted

## 2016-03-06 NOTE — H&P (Signed)
Teresa Holden 03/05/2016 3:05 PM Location: [facility] Patient #: [id number] DOB: May 30, 1985 Undefined / Language: Cleophus Molt / Race: White Female   History of Present Illness Teresa Canary A. Ninfa Linden MD; 03/05/2016 3:22 PM) The patient is a 31 year old female who presents with abdominal pain. This is a pleasant female referred to me by Dr Meriel Pica for evaluation of abdominal pain. The patient has been having epigastric pain and printed her back with bloating, nausea, and vomiting for approximately 3-4 weeks. It occurs after fatty meals. Over the past several years she has had some milder discomfort but is now getting worse. She denies jaundice. The pain is moderate in intensity. She is otherwise without complaints. She denies jaundice. She had an ultrasound which is unremarkable. She had a HIDA scan showing a 23% gallbladder ejection fraction.    Past Surgical History Malachy Moan, [place]; 03/05/2016 3:06 PM) Breast Augmentation  Bilateral. Breast Reconstruction  Bilateral. Oral Surgery   Diagnostic Studies History Malachy Moan, [place]; 03/05/2016 3:06 PM) Colonoscopy  never Mammogram  never Pap Smear  1-5 years ago  Allergies Malachy Moan, RMA; 03/05/2016 3:07 PM) Penicillin V Potassium *PENICILLINS*   Medication History Malachy Moan, RMA; 03/05/2016 3:09 PM) Lo Loestrin Fe (1 MG-10 MCG /10 MCG Tablet, Oral) Active. Esomeprazole Magnesium (40MG  Capsule DR, Oral) Active. Ondansetron HCl (8MG  Tablet, Oral) Active. Medications Reconciled  Social History Malachy Moan, [place]; 03/05/2016 3:06 PM) Caffeine use  Carbonated beverages. No alcohol use  No drug use  Tobacco use  Never smoker.  Family History Malachy Moan, [place]; 03/05/2016 3:06 PM) Anesthetic complications  Mother. Diabetes Mellitus  Brother, Father.  Pregnancy / Birth History Malachy Moan, [place]; 03/05/2016 3:06 PM) Age at menarche  61 years. Contraceptive  History  Oral contraceptives. Gravida  1 Length (months) of breastfeeding  3-6 Maternal age  83-20 Para  1 Regular periods   Other Problems Malachy Moan, [place]; 03/05/2016 3:06 PM) Asthma  Gastroesophageal Reflux Disease  General anesthesia - complications  Migraine Headache     Review of Systems Malachy Moan RMA; 03/05/2016 3:06 PM) General Not Present- Appetite Loss, Chills, Fatigue, Fever, Night Sweats, Weight Gain and Weight Loss. Skin Present- Dryness. Not Present- Change in Wart/Mole, Hives, Jaundice, New Lesions, Non-Healing Wounds, Rash and Ulcer. HEENT Not Present- Earache, Hearing Loss, Hoarseness, Nose Bleed, Oral Ulcers, Ringing in the Ears, Seasonal Allergies, Sinus Pain, Sore Throat, Visual Disturbances, Wears glasses/contact lenses and Yellow Eyes. Respiratory Not Present- Bloody sputum, Chronic Cough, Difficulty Breathing, Snoring and Wheezing. Breast Not Present- Breast Mass, Breast Pain, Nipple Discharge and Skin Changes. Cardiovascular Not Present- Chest Pain, Difficulty Breathing Lying Down, Leg Cramps, Palpitations, Rapid Heart Rate, Shortness of Breath and Swelling of Extremities. Gastrointestinal Present- Abdominal Pain, Bloating, Change in Bowel Habits, Constipation, Excessive gas, Indigestion, Nausea and Vomiting. Not Present- Bloody Stool, Chronic diarrhea, Difficulty Swallowing, Gets full quickly at meals, Hemorrhoids and Rectal Pain. Female Genitourinary Not Present- Frequency, Nocturia, Painful Urination, Pelvic Pain and Urgency. Musculoskeletal Not Present- Back Pain, Joint Pain, Joint Stiffness, Muscle Pain, Muscle Weakness and Swelling of Extremities. Neurological Present- Headaches. Not Present- Decreased Memory, Fainting, Numbness, Seizures, Tingling, Tremor, Trouble walking and Weakness. Psychiatric Not Present- Anxiety, Bipolar, Change in Sleep Pattern, Depression, Fearful and Frequent crying. Endocrine Not Present- Cold Intolerance,  Excessive Hunger, Hair Changes, Heat Intolerance, Hot flashes and New Diabetes. Hematology Not Present- Blood Thinners, Easy Bruising, Excessive bleeding, Gland problems, HIV and Persistent Infections.  Vitals Malachy Moan RMA; 03/05/2016 3:09 PM) 03/05/2016 3:09 PM Weight:  182 lb Height: 63in Body Surface Area: 1.86 m Body Mass Index: 32.24 kg/m  Temp.: 98.43F  Pulse: 81 (Regular)  BP: 118/80 (Sitting, Left Arm, Standard)       Physical Exam (Douglas A. Ninfa Linden MD; 03/05/2016 3:22 PM) General Mental Status-Alert. General Appearance-Consistent with stated age. Hydration-Well hydrated. Voice-Normal.  Head and Neck Head-normocephalic, atraumatic with no lesions or palpable masses.  Eye Eyeball - Bilateral-Extraocular movements intact. Sclera/Conjunctiva - Bilateral-No scleral icterus.  Chest and Lung Exam Chest and lung exam reveals -quiet, even and easy respiratory effort with no use of accessory muscles and on auscultation, normal breath sounds, no adventitious sounds and normal vocal resonance. Inspection Chest Wall - Normal. Back - normal.  Cardiovascular Cardiovascular examination reveals -on palpation PMI is normal in location and amplitude, no palpable S3 or S4. Normal cardiac borders., normal heart sounds, regular rate and rhythm with no murmurs, carotid auscultation reveals no bruits and normal pedal pulses bilaterally.  Abdomen Inspection Inspection of the abdomen reveals - No Hernias. Skin - Scar - no surgical scars. Palpation/Percussion Palpation and Percussion of the abdomen reveal - Soft, No Rebound tenderness, No Rigidity (guarding) and No hepatosplenomegaly. Tenderness - Epigastrium(There is mild tenderness with guarding in the epigastrium and right upper quadrant). Auscultation Auscultation of the abdomen reveals - Bowel sounds normal.  Neurologic - Did not examine.  Musculoskeletal - Did not examine.    Assessment  & Plan (Douglas A. 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 Given the severity of her symptoms, laparoscopic cholecystectomy is recommended. I discussed the procedure with her husband in detail. I discussed the reasons for gallbladder removal. I gave him literature regarding this. I discussed the risks of surgery which includes but is not limited to bleeding, infection, injury to surrounding structures, the need to convert to an open procedure, the chance this may not resolve all her symptoms, postoperative recovery, etc. They understand and wish to proceed with surgery as soon as possible

## 2016-03-07 ENCOUNTER — Ambulatory Visit (HOSPITAL_BASED_OUTPATIENT_CLINIC_OR_DEPARTMENT_OTHER): Payer: 59 | Admitting: Anesthesiology

## 2016-03-07 ENCOUNTER — Encounter (HOSPITAL_BASED_OUTPATIENT_CLINIC_OR_DEPARTMENT_OTHER)
Admission: RE | Disposition: A | Discharge status: Home / Self Care | Payer: Self-pay | Source: Ambulatory Visit | Attending: Surgery

## 2016-03-07 ENCOUNTER — Encounter (HOSPITAL_BASED_OUTPATIENT_CLINIC_OR_DEPARTMENT_OTHER): Payer: Self-pay | Admitting: *Deleted

## 2016-03-07 ENCOUNTER — Ambulatory Visit (HOSPITAL_BASED_OUTPATIENT_CLINIC_OR_DEPARTMENT_OTHER)
Admission: RE | Admit: 2016-03-07 | Discharge: 2016-03-07 | Disposition: A | Discharge status: Home / Self Care | Payer: 59 | Source: Ambulatory Visit | Attending: Surgery | Admitting: Surgery

## 2016-03-07 DIAGNOSIS — Z880 Allergy status to penicillin: Secondary | ICD-10-CM | POA: Diagnosis not present

## 2016-03-07 DIAGNOSIS — K811 Chronic cholecystitis: Secondary | ICD-10-CM | POA: Diagnosis not present

## 2016-03-07 DIAGNOSIS — K828 Other specified diseases of gallbladder: Secondary | ICD-10-CM | POA: Diagnosis not present

## 2016-03-07 DIAGNOSIS — F419 Anxiety disorder, unspecified: Secondary | ICD-10-CM | POA: Diagnosis not present

## 2016-03-07 DIAGNOSIS — K219 Gastro-esophageal reflux disease without esophagitis: Secondary | ICD-10-CM | POA: Diagnosis not present

## 2016-03-07 DIAGNOSIS — Z888 Allergy status to other drugs, medicaments and biological substances status: Secondary | ICD-10-CM | POA: Insufficient documentation

## 2016-03-07 HISTORY — PX: CHOLECYSTECTOMY: SHX55

## 2016-03-07 SURGERY — LAPAROSCOPIC CHOLECYSTECTOMY
Anesthesia: General

## 2016-03-07 MED ORDER — SCOPOLAMINE 1 MG/3DAYS TD PT72
1.0000 | MEDICATED_PATCH | Freq: Once | TRANSDERMAL | Status: DC | PRN
  Administered 2016-03-07: 1.5 mg via TRANSDERMAL

## 2016-03-07 MED ORDER — BUPIVACAINE-EPINEPHRINE (PF) 0.5% -1:200000 IJ SOLN
INTRAMUSCULAR | Status: AC
  Filled 2016-03-07: qty 30

## 2016-03-07 MED ORDER — SODIUM CHLORIDE 0.9% FLUSH: 3.0000 mL | INTRAVENOUS | Status: DC | PRN

## 2016-03-07 MED ORDER — MORPHINE SULFATE (PF) 2 MG/ML IV SOLN: 1.0000 mg | INTRAVENOUS | Status: DC | PRN

## 2016-03-07 MED ORDER — CIPROFLOXACIN IN D5W 400 MG/200ML IV SOLN
INTRAVENOUS | Status: AC
  Filled 2016-03-07: qty 200

## 2016-03-07 MED ORDER — SCOPOLAMINE 1 MG/3DAYS TD PT72
MEDICATED_PATCH | TRANSDERMAL | Status: AC
  Filled 2016-03-07: qty 1

## 2016-03-07 MED ORDER — ACETAMINOPHEN 325 MG PO TABS: 650.0000 mg | ORAL_TABLET | ORAL | Status: DC | PRN

## 2016-03-07 MED ORDER — OXYCODONE-ACETAMINOPHEN 5-325 MG PO TABS
1.0000 | ORAL_TABLET | Freq: Once | ORAL | Status: AC
  Administered 2016-03-07: 1 via ORAL

## 2016-03-07 MED ORDER — ROCURONIUM BROMIDE 100 MG/10ML IV SOLN
INTRAVENOUS | Status: DC | PRN
  Administered 2016-03-07: 40 mg via INTRAVENOUS

## 2016-03-07 MED ORDER — CHLORHEXIDINE GLUCONATE CLOTH 2 % EX PADS: 6.0000 | MEDICATED_PAD | Freq: Once | CUTANEOUS | Status: DC

## 2016-03-07 MED ORDER — CIPROFLOXACIN IN D5W 400 MG/200ML IV SOLN
400.0000 mg | INTRAVENOUS | Status: AC
  Administered 2016-03-07: 400 mg via INTRAVENOUS

## 2016-03-07 MED ORDER — PROPOFOL 10 MG/ML IV BOLUS
INTRAVENOUS | Status: DC | PRN
  Administered 2016-03-07: 150 mg via INTRAVENOUS

## 2016-03-07 MED ORDER — BUPIVACAINE-EPINEPHRINE (PF) 0.5% -1:200000 IJ SOLN
INTRAMUSCULAR | Status: DC | PRN
  Administered 2016-03-07: 20 mL

## 2016-03-07 MED ORDER — MIDAZOLAM HCL 2 MG/2ML IJ SOLN
1.0000 mg | INTRAMUSCULAR | Status: DC | PRN
  Administered 2016-03-07: 2 mg via INTRAVENOUS

## 2016-03-07 MED ORDER — OXYCODONE HCL 5 MG PO TABS: 5.0000 mg | ORAL_TABLET | ORAL | Status: DC | PRN

## 2016-03-07 MED ORDER — OXYCODONE-ACETAMINOPHEN 5-325 MG PO TABS: 1.0000 | ORAL_TABLET | ORAL | 0 refills | Status: DC | PRN

## 2016-03-07 MED ORDER — MIDAZOLAM HCL 2 MG/2ML IJ SOLN
INTRAMUSCULAR | Status: AC
  Filled 2016-03-07: qty 2

## 2016-03-07 MED ORDER — OXYCODONE-ACETAMINOPHEN 5-325 MG PO TABS
ORAL_TABLET | ORAL | Status: AC
  Filled 2016-03-07: qty 1

## 2016-03-07 MED ORDER — FENTANYL CITRATE (PF) 100 MCG/2ML IJ SOLN
50.0000 ug | INTRAMUSCULAR | Status: AC | PRN
  Administered 2016-03-07: 50 ug via INTRAVENOUS
  Administered 2016-03-07: 100 ug via INTRAVENOUS
  Administered 2016-03-07: 50 ug via INTRAVENOUS

## 2016-03-07 MED ORDER — DEXAMETHASONE SODIUM PHOSPHATE 4 MG/ML IJ SOLN
INTRAMUSCULAR | Status: DC | PRN
  Administered 2016-03-07: 10 mg via INTRAVENOUS

## 2016-03-07 MED ORDER — HYDROMORPHONE HCL 1 MG/ML IJ SOLN
0.2500 mg | INTRAMUSCULAR | Status: DC | PRN
  Administered 2016-03-07 (×2): 0.5 mg via INTRAVENOUS

## 2016-03-07 MED ORDER — ONDANSETRON HCL 4 MG/2ML IJ SOLN
INTRAMUSCULAR | Status: DC | PRN
  Administered 2016-03-07: 4 mg via INTRAVENOUS

## 2016-03-07 MED ORDER — FENTANYL CITRATE (PF) 100 MCG/2ML IJ SOLN
INTRAMUSCULAR | Status: AC
  Filled 2016-03-07: qty 2

## 2016-03-07 MED ORDER — SUGAMMADEX SODIUM 200 MG/2ML IV SOLN
INTRAVENOUS | Status: DC | PRN
  Administered 2016-03-07: 200 mg via INTRAVENOUS

## 2016-03-07 MED ORDER — HYDROMORPHONE HCL 1 MG/ML IJ SOLN
INTRAMUSCULAR | Status: AC
  Filled 2016-03-07: qty 1

## 2016-03-07 MED ORDER — ACETAMINOPHEN 650 MG RE SUPP: 650.0000 mg | RECTAL | Status: DC | PRN

## 2016-03-07 MED ORDER — EPHEDRINE SULFATE 50 MG/ML IJ SOLN
INTRAMUSCULAR | Status: DC | PRN
  Administered 2016-03-07: 10 mg via INTRAVENOUS

## 2016-03-07 MED ORDER — KETOROLAC TROMETHAMINE 30 MG/ML IJ SOLN
INTRAMUSCULAR | Status: DC | PRN
  Administered 2016-03-07: 30 mg via INTRAVENOUS

## 2016-03-07 MED ORDER — SODIUM CHLORIDE 0.9 % IR SOLN
Status: DC | PRN
  Administered 2016-03-07: 1000 mL

## 2016-03-07 MED ORDER — LACTATED RINGERS IV SOLN
INTRAVENOUS | Status: DC
  Administered 2016-03-07: 14:00:00 via INTRAVENOUS
  Administered 2016-03-07: 10 mL/h via INTRAVENOUS

## 2016-03-07 MED ORDER — SODIUM CHLORIDE 0.9% FLUSH: 3.0000 mL | Freq: Two times a day (BID) | INTRAVENOUS | Status: DC

## 2016-03-07 MED ORDER — SODIUM CHLORIDE 0.9 % IV SOLN: 250.0000 mL | INTRAVENOUS | Status: DC | PRN

## 2016-03-07 SURGICAL SUPPLY — 34 items
ADH SKN CLS APL DERMABOND .7 (GAUZE/BANDAGES/DRESSINGS) ×1
APPLIER CLIP 5 13 M/L LIGAMAX5 (MISCELLANEOUS) ×2
APR CLP MED LRG 5 ANG JAW (MISCELLANEOUS) ×1
BAG SPEC RTRVL LRG 6X4 10 (ENDOMECHANICALS) ×1
BLADE CLIPPER SURG (BLADE) IMPLANT
CHLORAPREP W/TINT 26ML (MISCELLANEOUS) ×2 IMPLANT
CLIP APPLIE 5 13 M/L LIGAMAX5 (MISCELLANEOUS) ×1 IMPLANT
COVER MAYO STAND STRL (DRAPES) ×1 IMPLANT
DECANTER SPIKE VIAL GLASS SM (MISCELLANEOUS) ×1 IMPLANT
DERMABOND ADVANCED (GAUZE/BANDAGES/DRESSINGS) ×1
DERMABOND ADVANCED .7 DNX12 (GAUZE/BANDAGES/DRESSINGS) ×1 IMPLANT
DRAPE C-ARM 42X72 X-RAY (DRAPES) IMPLANT
ELECT REM PT RETURN 9FT ADLT (ELECTROSURGICAL) ×2
ELECTRODE REM PT RTRN 9FT ADLT (ELECTROSURGICAL) ×1 IMPLANT
FILTER SMOKE EVAC LAPAROSHD (FILTER) IMPLANT
GLOVE SURG SIGNA 7.5 PF LTX (GLOVE) ×2 IMPLANT
GOWN STRL REUS W/ TWL LRG LVL3 (GOWN DISPOSABLE) ×3 IMPLANT
GOWN STRL REUS W/TWL LRG LVL3 (GOWN DISPOSABLE) ×6
NS IRRIG 1000ML POUR BTL (IV SOLUTION) ×2 IMPLANT
PACK BASIN DAY SURGERY FS (CUSTOM PROCEDURE TRAY) ×2 IMPLANT
POUCH SPECIMEN RETRIEVAL 10MM (ENDOMECHANICALS) ×1 IMPLANT
SCISSORS LAP 5X35 DISP (ENDOMECHANICALS) IMPLANT
SET CHOLANGIOGRAPH 5 50 .035 (SET/KITS/TRAYS/PACK) IMPLANT
SET IRRIG TUBING LAPAROSCOPIC (IRRIGATION / IRRIGATOR) ×2 IMPLANT
SLEEVE ENDOPATH XCEL 5M (ENDOMECHANICALS) ×4 IMPLANT
SLEEVE SCD COMPRESS KNEE MED (MISCELLANEOUS) ×2 IMPLANT
SPECIMEN JAR SMALL (MISCELLANEOUS) ×2 IMPLANT
SUT MON AB 4-0 PC3 18 (SUTURE) ×2 IMPLANT
TOWEL OR 17X24 6PK STRL BLUE (TOWEL DISPOSABLE) ×2 IMPLANT
TRAY LAPAROSCOPIC (CUSTOM PROCEDURE TRAY) ×2 IMPLANT
TROCAR XCEL BLUNT TIP 100MML (ENDOMECHANICALS) ×2 IMPLANT
TROCAR XCEL NON-BLD 5MMX100MML (ENDOMECHANICALS) ×2 IMPLANT
TUBE CONNECTING 20X1/4 (TUBING) ×2 IMPLANT
TUBING INSUFFLATION (TUBING) ×2 IMPLANT

## 2016-03-07 NOTE — Anesthesia Postprocedure Evaluation (Signed)
Anesthesia Post Note  Patient: Teresa Holden  Procedure(s) Performed: Procedure(s) (LRB): LAPAROSCOPIC CHOLECYSTECTOMY (N/A)  Patient location during evaluation: PACU Anesthesia Type: General Level of consciousness: awake Pain management: pain level controlled Vital Signs Assessment: post-procedure vital signs reviewed and stable Respiratory status: spontaneous breathing Cardiovascular status: stable Anesthetic complications: no       Last Vitals:  Vitals:   03/07/16 1445 03/07/16 1500  BP: 128/72 131/74  Pulse: 80 95  Resp: 20 20  Temp:  36.6 C    Last Pain:  Vitals:   03/07/16 1545  TempSrc:   PainSc: 1                  CHARLENE GREEN

## 2016-03-07 NOTE — Transfer of Care (Signed)
Immediate Anesthesia Transfer of Care Note  Patient: Teresa Holden  Procedure(s) Performed: Procedure(s): LAPAROSCOPIC CHOLECYSTECTOMY (N/A)  Patient Location: PACU  Anesthesia Type:General  Level of Consciousness: awake, alert  and oriented  Airway & Oxygen Therapy: Patient Spontanous Breathing and Patient connected to face mask oxygen  Post-op Assessment: Report given to RN and Post -op Vital signs reviewed and stable  Post vital signs: Reviewed and stable  Last Vitals:  Vitals:   03/07/16 1115 03/07/16 1343  BP: 115/76 133/75  Pulse: 83 (!) 133  Resp: 18 (P) 15  Temp: 36.8 C (P) 36.7 C    Last Pain:  Vitals:   03/07/16 1115  TempSrc: Oral  PainSc: 2          Complications: No apparent anesthesia complications

## 2016-03-07 NOTE — Anesthesia Procedure Notes (Signed)
Procedure Name: Intubation Date/Time: 03/07/2016 1:03 PM Performed by: Melynda Ripple D Pre-anesthesia Checklist: Patient identified, Emergency Drugs available, Suction available and Patient being monitored Patient Re-evaluated:Patient Re-evaluated prior to inductionOxygen Delivery Method: Circle system utilized Preoxygenation: Pre-oxygenation with 100% oxygen Intubation Type: IV induction Ventilation: Mask ventilation without difficulty Grade View: Grade I Tube type: Oral Tube size: 7.0 mm Number of attempts: 1 Airway Equipment and Method: Stylet and Oral airway Placement Confirmation: ETT inserted through vocal cords under direct vision,  positive ETCO2 and breath sounds checked- equal and bilateral Secured at: 23 cm Tube secured with: Tape Dental Injury: Teeth and Oropharynx as per pre-operative assessment

## 2016-03-07 NOTE — Anesthesia Preprocedure Evaluation (Addendum)
Anesthesia Evaluation  Patient identified by MRN, date of birth, ID band Patient awake    Reviewed: Allergy & Precautions, NPO status , Patient's Chart, lab work & pertinent test results  History of Anesthesia Complications (+) PONV  Airway Mallampati: I  TM Distance: >3 FB Neck ROM: Full    Dental   Pulmonary    Pulmonary exam normal breath sounds clear to auscultation       Cardiovascular negative cardio ROS Normal cardiovascular exam Rhythm:Regular Rate:Normal     Neuro/Psych  Headaches, Anxiety    GI/Hepatic negative GI ROS, Neg liver ROS,   Endo/Other  negative endocrine ROS  Renal/GU negative Renal ROS     Musculoskeletal   Abdominal   Peds  Hematology   Anesthesia Other Findings   Reproductive/Obstetrics                           Anesthesia Physical Anesthesia Plan  ASA: II  Anesthesia Plan: General   Post-op Pain Management:    Induction: Intravenous  Airway Management Planned: Oral ETT  Additional Equipment:   Intra-op Plan:   Post-operative Plan: Extubation in OR  Informed Consent: I have reviewed the patients History and Physical, chart, labs and discussed the procedure including the risks, benefits and alternatives for the proposed anesthesia with the patient or authorized representative who has indicated his/her understanding and acceptance.   Dental advisory given  Plan Discussed with: CRNA, Surgeon and Anesthesiologist  Anesthesia Plan Comments:        Anesthesia Quick Evaluation

## 2016-03-07 NOTE — Interval H&P Note (Signed)
History and Physical Interval Note: no change in H and P  03/07/2016 11:37 AM  Teresa Holden  has presented today for surgery, with the diagnosis of BILIARY DYSKINESIA  The various methods of treatment have been discussed with the patient and family. After consideration of risks, benefits and other options for treatment, the patient has consented to  Procedure(s): LAPAROSCOPIC CHOLECYSTECTOMY (N/A) as a surgical intervention .  The patient's history has been reviewed, patient examined, no change in status, stable for surgery.  I have reviewed the patient's chart and labs.  Questions were answered to the patient's satisfaction.     BLACKMAN,DOUGLAS A

## 2016-03-07 NOTE — Discharge Instructions (Signed)
[facility] ______[facility] LAPAROSCOPIC SURGERY: POST OP INSTRUCTIONS Always review your discharge instruction sheet given to you by the facility where your surgery was performed. IF YOU HAVE DISABILITY OR FAMILY LEAVE FORMS, YOU MUST BRING THEM TO THE OFFICE FOR PROCESSING.   DO NOT GIVE THEM TO YOUR DOCTOR.  1. A prescription for pain medication may be given to you upon discharge.  Take your pain medication as prescribed, if needed.  If narcotic pain medicine is not needed, then you may take acetaminophen (Tylenol) or ibuprofen (Advil) as needed. 2. Take your usually prescribed medications unless otherwise directed. 3. If you need a refill on your pain medication, please contact your pharmacy.  They will contact our office to request authorization. Prescriptions will not be filled after 5pm or on week-ends. 4. You should follow a light diet the first few days after arrival home, such as soup and crackers, etc.  Be sure to include lots of fluids daily. 5. Most patients will experience some swelling and bruising in the area of the incisions.  Ice packs will help.  Swelling and bruising can take several days to resolve.  6. It is common to experience some constipation if taking pain medication after surgery.  Increasing fluid intake and taking a stool softener (such as Colace) will usually help or prevent this problem from occurring.  A mild laxative (Milk of Magnesia or Miralax) should be taken according to package instructions if there are no bowel movements after 48 hours. 7. Unless discharge instructions indicate otherwise, you may remove your bandages 24-48 hours after surgery, and you may shower at that time.  You may have steri-strips (small skin tapes) in place directly over the incision.  These strips should be left on the skin for 7-10 days.  If your surgeon used skin glue on the incision, you may shower in 24 hours.  The glue will flake off over the next 2-3 weeks.  Any sutures or  staples will be removed at the office during your follow-up visit. 8. ACTIVITIES:  You may resume regular (light) daily activities beginning the next day--such as daily self-care, walking, climbing stairs--gradually increasing activities as tolerated.  You may have sexual intercourse when it is comfortable.  Refrain from any heavy lifting or straining until approved by your doctor. a. You may drive when you are no longer taking prescription pain medication, you can comfortably wear a seatbelt, and you can safely maneuver your car and apply brakes. b. RETURN TO WORK:  ___1 TO 2 WEEKS_______________________________________________________ 9. You should see your doctor in the office for a follow-up appointment approximately 2-3 weeks after your surgery.  Make sure that you call for this appointment within a day or two after you arrive home to insure a convenient appointment time. 10. OTHER INSTRUCTIONS:ICE PACK AND IBUPROFEN ALSO FOR PAIN 11. OK TO SHOWER TOMORROW 12. NO LIFTING MORE THAN 15 POUNDS FOR 2 WEEKS __________________________________________________________________________________________________________________________ __________________________________________________________________________________________________________________________ WHEN TO CALL YOUR DOCTOR: 1. Fever over 101.0 2. Inability to urinate 3. Continued bleeding from incision. 4. Increased pain, redness, or drainage from the incision. 5. Increasing abdominal pain  The clinic staff is available to answer your questions during regular business hours.  Please dont hesitate to call and ask to speak to one of the nurses for clinical concerns.  If you have a medical emergency, go to the nearest emergency room or call 911.  A surgeon from [facility] is always on call at the hospital. [address] [employer]  [address], [place], [place]  [address] ? [address] [place], [place],    [address] [phone] ? [phone] ?  FAX [phone] Web site: [email or web address]   Post Anesthesia Home Care Instructions  Activity: Get plenty of rest for the remainder of the day. A responsible adult should stay with you for 24 hours following the procedure.  For the next 24 hours, DO NOT: -Drive a car -Paediatric nurse -Drink alcoholic beverages -Take any medication unless instructed by your physician -Make any legal decisions or sign important papers.  Meals: Start with liquid foods such as gelatin or soup. Progress to regular foods as tolerated. Avoid greasy, spicy, heavy foods. If nausea and/or vomiting occur, drink only clear liquids until the nausea and/or vomiting subsides. Call your physician if vomiting continues.  Special Instructions/Symptoms: Your throat may feel dry or sore from the anesthesia or the breathing tube placed in your throat during surgery. If this causes discomfort, gargle with warm salt water. The discomfort should disappear within 24 hours.  If you had a scopolamine patch placed behind your ear for the management of post- operative nausea and/or vomiting:  1. The medication in the patch is effective for 72 hours, after which it should be removed.  Wrap patch in a tissue and discard in the trash. Wash hands thoroughly with soap and water. 2. You may remove the patch earlier than 72 hours if you experience unpleasant side effects which may include dry mouth, dizziness or visual disturbances. 3. Avoid touching the patch. Wash your hands with soap and water after contact with the patch.

## 2016-03-07 NOTE — Op Note (Signed)

## 2016-03-08 ENCOUNTER — Encounter (HOSPITAL_BASED_OUTPATIENT_CLINIC_OR_DEPARTMENT_OTHER): Payer: Self-pay | Admitting: Surgery

## 2016-03-08 LAB — POCT PREGNANCY, URINE: Preg Test, Ur: NEGATIVE

## 2016-03-14 ENCOUNTER — Encounter: Payer: Self-pay | Admitting: Obstetrics & Gynecology

## 2016-03-18 MED FILL — ESOMEPRAZOLE MAG DR 40 MG C: 40 | 90 days supply | Qty: 90 | Fill #1

## 2016-04-18 ENCOUNTER — Ambulatory Visit: Payer: 59 | Admitting: Neurology

## 2016-04-22 ENCOUNTER — Other Ambulatory Visit: Payer: Self-pay | Admitting: *Deleted

## 2016-04-22 DIAGNOSIS — N912 Amenorrhea, unspecified: Secondary | ICD-10-CM

## 2016-04-23 ENCOUNTER — Other Ambulatory Visit: Payer: Self-pay | Admitting: Obstetrics & Gynecology

## 2016-04-23 MED ORDER — DOXYLAMINE-PYRIDOXINE 10-10 MG PO TBEC: DELAYED_RELEASE_TABLET | ORAL | 0 refills | Status: DC

## 2016-04-23 MED FILL — DICLEGIS DR 10-10 MG TABLET: 10-10 | 25 days supply | Qty: 60 | Fill #0

## 2016-04-25 ENCOUNTER — Ambulatory Visit (INDEPENDENT_AMBULATORY_CARE_PROVIDER_SITE_OTHER): Payer: 59

## 2016-04-25 ENCOUNTER — Ambulatory Visit (INDEPENDENT_AMBULATORY_CARE_PROVIDER_SITE_OTHER): Payer: 59 | Admitting: Obstetrics & Gynecology

## 2016-04-25 DIAGNOSIS — O3680X1 Pregnancy with inconclusive fetal viability, fetus 1: Secondary | ICD-10-CM | POA: Diagnosis not present

## 2016-04-25 DIAGNOSIS — N912 Amenorrhea, unspecified: Secondary | ICD-10-CM

## 2016-04-25 NOTE — Progress Notes (Signed)
GYNECOLOGY  VISIT   HPI: 31 y.o. G2P1 Married Caucasian female here for viability scan.  Pt's LMP is not accurate as she just had laparoscopic cholecystectomy 03/07/16 and stopped her contraceptive before the surgery.  She is having a lot of nausea and just started Diclegis which is helping some.  It does make her drowsy so she doesn't think she wants to add this during the day.  Denies vaginal bleeding or pelvic pain.  GYNECOLOGIC HISTORY: No LMP recorded (lmp unknown). Patient is pregnant.   Patient Active Problem List   Diagnosis Date Noted  . Adult BMI > 30 10/24/2015  . Chronic migraine without aura without status migrainosus, not intractable 06/10/2015  . Sjogren's syndrome ([place])   . Migraines   . Anxiety     Past Medical History:  Diagnosis Date  . ADD (attention deficit disorder with hyperactivity)   . Anxiety   . Family history of anesthesia complication    N&V  . Migraines   . PONV (postoperative nausea and vomiting)   . Sjogren's disease [facility])     Past Surgical History:  Procedure Laterality Date  . BREAST ENHANCEMENT SURGERY  2013   Dr. Lenon Curt  . CHOLECYSTECTOMY N/A 03/07/2016   Procedure: LAPAROSCOPIC CHOLECYSTECTOMY;  Surgeon: Coralie Keens, MD;  Location: Campus;  Service: General;  Laterality: N/A;  . HYSTEROSCOPY N/A 10/15/2013   Procedure: HYSTEROSCOPY with removal of IUD;  Surgeon: Lyman Speller, MD;  Location: [facility];  Service: Gynecology;  Laterality: N/A;  . WISDOM TOOTH EXTRACTION Bilateral 2008    MEDS:  Reviewed in EPIC and UTD  ALLERGIES: Penicillins  Family History  Problem Relation Age of Onset  . Sarcoidosis Mother   . Heart disease Maternal Grandfather   . Diabetes Paternal Grandfather   . Heart disease Paternal Grandfather     SH:  Married, non smoker  Review of Systems  Gastrointestinal: Positive for nausea and vomiting.  All other systems reviewed and are negative.   PHYSICAL EXAMINATION:    LMP  (LMP  Unknown)     General appearance: alert, cooperative and appears stated age No other exam performed today.  PUS: Single IUD with CRL of 0.28cm.  EGA 5 6/7 weeks.  EDC 12/20/16.  Normal gestational sac, yolk sac, and +FCA at 94 BPM Left ovary:  2.6 x 1.8cm Right ovary:  2.0 x 1.1cm with 1.7cm corpus luteal cyst noted Cul de sac:  No free fluid  Discussion:  Dating discussed.  Transfer of care discussed.  Pt aware to not change cat litter.  Has no cats in the home.  Tdap 2010.  Aware will need update.  Had chicken pox.  Aware flu vaccine safe and recommended.   Fish/shellfish/mercury discuss.  Patient does not eat a lot of fish.  Unpasteurized cheese/juices discussed.  Nitrites in foods disucssed.  Exercise and intercourse discussed.  Fetal DNA particle testing discussed.  First trimester down's testing discussed.  Cystic fibrosis discussed.    Assessment: Viable singleton IUP at 5 6/7 weeks  Plan: Pt will plan transfer of care and where records should go for her OB care.  ~15 minutes spent with patient >50% of time was in face to face discussion of above.

## 2016-04-28 ENCOUNTER — Encounter: Payer: Self-pay | Admitting: Obstetrics & Gynecology

## 2016-05-22 DIAGNOSIS — Z3481 Encounter for supervision of other normal pregnancy, first trimester: Secondary | ICD-10-CM | POA: Diagnosis not present

## 2016-05-22 DIAGNOSIS — Z3689 Encounter for other specified antenatal screening: Secondary | ICD-10-CM | POA: Diagnosis not present

## 2016-05-22 DIAGNOSIS — G43909 Migraine, unspecified, not intractable, without status migrainosus: Secondary | ICD-10-CM | POA: Diagnosis not present

## 2016-05-22 DIAGNOSIS — Z3A01 Less than 8 weeks gestation of pregnancy: Secondary | ICD-10-CM | POA: Diagnosis not present

## 2016-05-22 DIAGNOSIS — O219 Vomiting of pregnancy, unspecified: Secondary | ICD-10-CM | POA: Diagnosis not present

## 2016-05-22 LAB — OB RESULTS CONSOLE ABO/RH

## 2016-05-22 LAB — OB RESULTS CONSOLE RPR: RPR: NONREACTIVE

## 2016-05-22 LAB — OB RESULTS CONSOLE RUBELLA ANTIBODY, IGM: Rubella: IMMUNE

## 2016-05-22 LAB — OB RESULTS CONSOLE ANTIBODY SCREEN: Antibody Screen: NEGATIVE

## 2016-05-22 LAB — OB RESULTS CONSOLE HIV ANTIBODY (ROUTINE TESTING): HIV: NONREACTIVE

## 2016-05-22 LAB — OB RESULTS CONSOLE HEPATITIS B SURFACE ANTIGEN: Hepatitis B Surface Ag: NEGATIVE

## 2016-05-29 DIAGNOSIS — M3500 Sicca syndrome, unspecified: Secondary | ICD-10-CM | POA: Diagnosis not present

## 2016-05-29 DIAGNOSIS — Z3689 Encounter for other specified antenatal screening: Secondary | ICD-10-CM | POA: Diagnosis not present

## 2016-05-29 DIAGNOSIS — Z3481 Encounter for supervision of other normal pregnancy, first trimester: Secondary | ICD-10-CM | POA: Diagnosis not present

## 2016-05-29 LAB — OB RESULTS CONSOLE GC/CHLAMYDIA
Chlamydia: NEGATIVE
Gonorrhea: NEGATIVE

## 2016-05-30 ENCOUNTER — Ambulatory Visit: Payer: 59 | Admitting: Obstetrics & Gynecology

## 2016-06-10 MED FILL — BUTALB-ACETAMIN-CAFF 50-325: 50-325-40 | 5 days supply | Qty: 30 | Fill #0

## 2016-07-10 DIAGNOSIS — Z3482 Encounter for supervision of other normal pregnancy, second trimester: Secondary | ICD-10-CM | POA: Diagnosis not present

## 2016-07-10 DIAGNOSIS — Z361 Encounter for antenatal screening for raised alphafetoprotein level: Secondary | ICD-10-CM | POA: Diagnosis not present

## 2016-07-24 DIAGNOSIS — Z363 Encounter for antenatal screening for malformations: Secondary | ICD-10-CM | POA: Diagnosis not present

## 2016-08-14 DIAGNOSIS — O4402 Placenta previa specified as without hemorrhage, second trimester: Secondary | ICD-10-CM | POA: Diagnosis not present

## 2016-08-14 DIAGNOSIS — Z3A21 21 weeks gestation of pregnancy: Secondary | ICD-10-CM | POA: Diagnosis not present

## 2016-08-29 DIAGNOSIS — O9989 Other specified diseases and conditions complicating pregnancy, childbirth and the puerperium: Secondary | ICD-10-CM | POA: Diagnosis not present

## 2016-08-29 DIAGNOSIS — M3500 Sicca syndrome, unspecified: Secondary | ICD-10-CM | POA: Diagnosis not present

## 2016-08-29 DIAGNOSIS — Z3A23 23 weeks gestation of pregnancy: Secondary | ICD-10-CM | POA: Diagnosis not present

## 2016-09-02 ENCOUNTER — Inpatient Hospital Stay (HOSPITAL_COMMUNITY): Payer: 59

## 2016-09-02 ENCOUNTER — Encounter (HOSPITAL_COMMUNITY): Payer: Self-pay | Admitting: *Deleted

## 2016-09-02 ENCOUNTER — Observation Stay (HOSPITAL_COMMUNITY)
Admission: AD | Admit: 2016-09-02 | Discharge: 2016-09-03 | Disposition: A | Discharge status: Home / Self Care | Payer: 59 | Source: Ambulatory Visit | Attending: Obstetrics and Gynecology | Admitting: Obstetrics and Gynecology

## 2016-09-02 DIAGNOSIS — O2302 Infections of kidney in pregnancy, second trimester: Secondary | ICD-10-CM | POA: Diagnosis present

## 2016-09-02 DIAGNOSIS — O26832 Pregnancy related renal disease, second trimester: Secondary | ICD-10-CM | POA: Diagnosis not present

## 2016-09-02 DIAGNOSIS — O99112 Other diseases of the blood and blood-forming organs and certain disorders involving the immune mechanism complicating pregnancy, second trimester: Secondary | ICD-10-CM | POA: Diagnosis not present

## 2016-09-02 DIAGNOSIS — D72829 Elevated white blood cell count, unspecified: Secondary | ICD-10-CM | POA: Diagnosis not present

## 2016-09-02 DIAGNOSIS — N209 Urinary calculus, unspecified: Secondary | ICD-10-CM | POA: Diagnosis not present

## 2016-09-02 DIAGNOSIS — N1330 Unspecified hydronephrosis: Secondary | ICD-10-CM | POA: Diagnosis not present

## 2016-09-02 DIAGNOSIS — M549 Dorsalgia, unspecified: Secondary | ICD-10-CM

## 2016-09-02 DIAGNOSIS — M545 Low back pain: Secondary | ICD-10-CM | POA: Diagnosis present

## 2016-09-02 DIAGNOSIS — R110 Nausea: Secondary | ICD-10-CM | POA: Diagnosis not present

## 2016-09-02 DIAGNOSIS — Z3A24 24 weeks gestation of pregnancy: Secondary | ICD-10-CM | POA: Insufficient documentation

## 2016-09-02 DIAGNOSIS — N12 Tubulo-interstitial nephritis, not specified as acute or chronic: Secondary | ICD-10-CM | POA: Diagnosis present

## 2016-09-02 LAB — URINALYSIS, ROUTINE W REFLEX MICROSCOPIC
Bacteria, UA: NONE SEEN
Bilirubin Urine: NEGATIVE
Glucose, UA: NEGATIVE mg/dL
Ketones, ur: 5 mg/dL — AB
Nitrite: NEGATIVE
Protein, ur: 30 mg/dL — AB
Specific Gravity, Urine: 1.023 (ref 1.005–1.030)
pH: 6 (ref 5.0–8.0)

## 2016-09-02 LAB — CBC
HCT: 36.1 % (ref 36.0–46.0)
Hemoglobin: 11.6 g/dL — ABNORMAL LOW (ref 12.0–15.0)
MCH: 27 pg (ref 26.0–34.0)
MCHC: 32.1 g/dL (ref 30.0–36.0)
MCV: 84.1 fL (ref 78.0–100.0)
Platelets: 241 10*3/uL (ref 150–400)
RBC: 4.29 MIL/uL (ref 3.87–5.11)
RDW: 13.7 % (ref 11.5–15.5)
WBC: 16.3 10*3/uL — ABNORMAL HIGH (ref 4.0–10.5)

## 2016-09-02 LAB — TYPE AND SCREEN
ABO/RH(D): A POS
Antibody Screen: NEGATIVE

## 2016-09-02 LAB — ABO/RH: ABO/RH(D): A POS

## 2016-09-02 MED ORDER — AZTREONAM 2 G IJ SOLR
1.0000 g | Freq: Three times a day (TID) | INTRAMUSCULAR | Status: DC
  Filled 2016-09-02 (×4): qty 2

## 2016-09-02 MED ORDER — DOCUSATE SODIUM 100 MG PO CAPS
100.0000 mg | ORAL_CAPSULE | Freq: Every day | ORAL | Status: DC
  Administered 2016-09-03: 100 mg via ORAL
  Filled 2016-09-02: qty 1

## 2016-09-02 MED ORDER — LACTATED RINGERS IV BOLUS (SEPSIS)
1000.0000 mL | Freq: Once | INTRAVENOUS | Status: AC
  Administered 2016-09-02: 1000 mL via INTRAVENOUS

## 2016-09-02 MED ORDER — CALCIUM CARBONATE ANTACID 500 MG PO CHEW: 2.0000 | CHEWABLE_TABLET | ORAL | Status: DC | PRN

## 2016-09-02 MED ORDER — CYCLOBENZAPRINE HCL 5 MG PO TABS
5.0000 mg | ORAL_TABLET | Freq: Once | ORAL | Status: AC
  Administered 2016-09-02: 5 mg via ORAL
  Filled 2016-09-02: qty 1

## 2016-09-02 MED ORDER — PRENATAL MULTIVITAMIN CH
1.0000 | ORAL_TABLET | Freq: Every day | ORAL | Status: DC
  Administered 2016-09-03: 1 via ORAL
  Filled 2016-09-02: qty 1

## 2016-09-02 MED ORDER — DEXTROSE 5 % IV SOLN
1.0000 g | Freq: Three times a day (TID) | INTRAVENOUS | Status: DC
  Administered 2016-09-02 – 2016-09-03 (×4): 1 g via INTRAVENOUS
  Filled 2016-09-02 (×5): qty 1

## 2016-09-02 MED ORDER — SODIUM CHLORIDE 0.9 % IV SOLN
INTRAVENOUS | Status: DC
  Administered 2016-09-02 – 2016-09-03 (×3): via INTRAVENOUS

## 2016-09-02 MED ORDER — HYDROMORPHONE HCL 1 MG/ML IJ SOLN: 1.0000 mg | INTRAMUSCULAR | Status: DC | PRN

## 2016-09-02 MED ORDER — ACETAMINOPHEN 325 MG PO TABS
650.0000 mg | ORAL_TABLET | ORAL | Status: DC | PRN
  Administered 2016-09-02 – 2016-09-03 (×2): 650 mg via ORAL
  Filled 2016-09-02 (×2): qty 2

## 2016-09-02 MED ORDER — ZOLPIDEM TARTRATE 5 MG PO TABS: 5.0000 mg | ORAL_TABLET | Freq: Every evening | ORAL | Status: DC | PRN

## 2016-09-02 MED ORDER — CYCLOBENZAPRINE HCL 5 MG PO TABS: 5.0000 mg | ORAL_TABLET | Freq: Once | ORAL | Status: DC

## 2016-09-02 MED ORDER — PROMETHAZINE HCL 25 MG/ML IJ SOLN
25.0000 mg | Freq: Once | INTRAVENOUS | Status: AC
  Administered 2016-09-02: 25 mg via INTRAVENOUS
  Filled 2016-09-02: qty 1

## 2016-09-02 NOTE — MAU Note (Signed)
Patient traveled back home yesterday.  Noticed left lower back pain. Today pain is radiating to left upper abdomen. Is constant and sharp. Rating pain 9/10 Took tylenol this am  +nausea with the pain

## 2016-09-02 NOTE — MAU Provider Note (Addendum)
History and Physical Exam Note    Chief Complaint  Patient presents with  . Back Pain  . Abdominal Pain   Ms. Teresa Holden is a 31 yo G2P1001 at 24.[redacted] wks gestation presenting to MAU with complaints of left lower back pain that radiates to the LUQ.  She travelled by car from [facility] yesterday.  She woke up this AM with the pain and it has gradually worsened throughout today.  She took Tylenol 650 mg at 0930 this AM; with no relief.  She also has complains of nausea caused by the pain.  She denies contractions, VB or LOF. History of urolithiasis.  Denies fever or SOB. Good FM. Uncomplicated pregnancy to date.    Past Medical History:  Diagnosis Date  . ADD (attention deficit disorder with hyperactivity)   . Anxiety   . Family history of anesthesia complication    N&V  . Migraines   . PONV (postoperative nausea and vomiting)   . Sjogren's disease [facility])     Past Surgical History:  Procedure Laterality Date  . BREAST ENHANCEMENT SURGERY  2013   Dr. Lenon Curt  . CHOLECYSTECTOMY N/A 03/07/2016   Procedure: LAPAROSCOPIC CHOLECYSTECTOMY;  Surgeon: Coralie Keens, MD;  Location: [facility];  Service: General;  Laterality: N/A;  . HYSTEROSCOPY N/A 10/15/2013   Procedure: HYSTEROSCOPY with removal of IUD;  Surgeon: Lyman Speller, MD;  Location: [facility];  Service: Gynecology;  Laterality: N/A;  . WISDOM TOOTH EXTRACTION Bilateral 2008    Family History  Problem Relation Age of Onset  . Sarcoidosis Mother   . Heart disease Maternal Grandfather   . Diabetes Paternal Grandfather   . Heart disease Paternal Grandfather     Social History  Substance Use Topics  . Smoking status: Never Smoker  . Smokeless tobacco: Never Used  . Alcohol use No    Allergies:  Allergies  Allergen Reactions  . Penicillins Rash    Has patient had a PCN reaction causing immediate rash, facial/tongue/throat swelling, SOB or lightheadedness with hypotension: Yes Has patient had a  PCN reaction causing severe rash involving mucus membranes or skin necrosis: Yes Has patient had a PCN reaction that required hospitalization: No Has patient had a PCN reaction occurring within the last 10 years: No If all of the above answers are "NO", then may proceed with Cephalosporin use.    Prescriptions Prior to Admission  Medication Sig Dispense Refill Last Dose  . Prenatal Vit-Fe Fumarate-FA (PRENATAL MULTIVITAMIN) TABS tablet Take 1 tablet by mouth daily at 12 noon.   09/01/2016 at Unknown time  . ranitidine (ZANTAC) 150 MG capsule Take 150 mg by mouth daily.   09/01/2016 at Unknown time    Review of Systems  Constitutional: Negative.  Negative for chills and fever.  HENT: Negative.   Eyes: Negative.   Respiratory: Negative.  Negative for shortness of breath.   Cardiovascular: Negative.   Gastrointestinal: Positive for abdominal pain (LUQ).  Endocrine: Negative.   Genitourinary: Positive for flank pain (LT side).  Musculoskeletal: Positive for back pain (LT flank pain).  Skin: Negative.   Allergic/Immunologic: Negative.   Neurological: Negative.  Negative for headaches.  Hematological: Negative.   Psychiatric/Behavioral: Negative.    Physical Exam   Blood pressure 119/61, pulse 71, temperature (!) 97.4 F (36.3 C), temperature source Oral, resp. rate 19, SpO2 100 %.  Physical Exam  Nursing note and vitals reviewed. Constitutional: She is oriented to person, place, and time. Vital signs are normal. She appears  well-developed and well-nourished.  HENT:  Head: Normocephalic and atraumatic.  Eyes: Pupils are equal, round, and reactive to light.  Neck: Normal range of motion. Neck supple.  Cardiovascular: Normal rate, regular rhythm and normal heart sounds.   Respiratory: Effort normal and breath sounds normal.  GI: Soft. There is CVA tenderness (LT side).  Hypoactive BS  Genitourinary: Uterus normal.  Musculoskeletal: Normal range of motion.  Neurological: She is  alert and oriented to person, place, and time. She has normal reflexes.  Skin: Skin is warm and dry.  Psychiatric: She has a normal mood and affect. Her behavior is normal. Judgment and thought content normal.    MAU Course  Procedures  MDM CCUA NST Phenergan 25 mg in LR 1000 ml bolus - relieved N/V CBC Flexeril 5 mg po 30 mins after IV bolus - relieved pain Renal U/S  Results for orders placed or performed during the hospital encounter of 09/02/16 (from the past 24 hour(s))  Urinalysis, Routine w reflex microscopic     Status: Abnormal   Collection Time: 09/02/16  1:40 PM  Result Value Ref Range   Color, Urine YELLOW YELLOW   APPearance HAZY (A) CLEAR   Specific Gravity, Urine 1.023 1.005 - 1.030   pH 6.0 5.0 - 8.0   Glucose, UA NEGATIVE NEGATIVE mg/dL   Hgb urine dipstick LARGE (A) NEGATIVE   Bilirubin Urine NEGATIVE NEGATIVE   Ketones, ur 5 (A) NEGATIVE mg/dL   Protein, ur 30 (A) NEGATIVE mg/dL   Nitrite NEGATIVE NEGATIVE   Leukocytes, UA TRACE (A) NEGATIVE   RBC / HPF TOO NUMEROUS TO COUNT 0 - 5 RBC/hpf   WBC, UA 6-30 0 - 5 WBC/hpf   Bacteria, UA NONE SEEN NONE SEEN   Squamous Epithelial / LPF 0-5 (A) NONE SEEN   Mucous PRESENT   CBC     Status: Abnormal   Collection Time: 09/02/16  2:04 PM  Result Value Ref Range   WBC 16.3 (H) 4.0 - 10.5 K/uL   RBC 4.29 3.87 - 5.11 MIL/uL   Hemoglobin 11.6 (L) 12.0 - 15.0 g/dL   HCT 36.1 36.0 - 46.0 %   MCV 84.1 78.0 - 100.0 fL   MCH 27.0 26.0 - 34.0 pg   MCHC 32.1 30.0 - 36.0 g/dL   RDW 13.7 11.5 - 15.5 %   Platelets 241 150 - 400 K/uL   US Renal  Result Date: 09/02/2016 CLINICAL DATA:  31 year old female is pregnant in the second trimester with left side, left upper quadrant pain. EXAM: RENAL / URINARY TRACT ULTRASOUND COMPLETE COMPARISON:  Pelvis ultrasound 04/25/2016. Right upper quadrant ultrasound 02/19/2016. FINDINGS: Right Kidney: Length: 8.6 cm. Echogenicity within normal limits. No mass or hydronephrosis visualized.  Left Kidney: Length: 11.6 cm. There is mild to moderate left hydronephrosis (image 23). Left renal cortical thickness and echogenicity remains normal. No nephrolithiasis is evident by ultrasound. Bladder: Appears normal for degree of bladder distention. Both ureteral jets are detected with Doppler (image 19). IMPRESSION: 1. Positive for mild to moderate left hydronephrosis. Obstructing etiology is unclear, as maternal hydronephrosis of pregnancy more commonly occurs on the right side. 2. The right kidney in urinary bladder appear normal. Electronically Signed   By: Genevie Ann M.D.   On: 09/02/2016 15:07   CEFM  FHR: 140 bpm / moderate variability / accels present / decels absent TOCO: none  *Consult with Dr. Ronita Hipps @ 1530 - notified of patient's complaints, assessments, lab & U/S results, tx plan admit to antenatal  for pyelonephritis (see plan)  Assessment and Plan  1-Pyelonephritis affecting pregnancy in second trimester vs complicated urolithiasis. Flank pain, pos UA with leukocytosis. Left hydronephrosis. 2- 24 + week IUP 3-History of Urolithiasis  - Admit  - Continue IVF - Dilaudid 1 mg every 3 hrs prn  pain - Phenergan 25 IVP every 6 hrs prn N/V - Azactam 1 gm IVPB every 8 hrs(PCN allergy) - Strain urine - Repeat CBC in AM - Activity as tolerated - Diet as tolerated - FHTs every shift - UCS - Urology consult if no improvement   Laury Deep, MSN, CNM 09/02/2016, 1:23 PM

## 2016-09-02 NOTE — Progress Notes (Signed)
Assumed care while primary nurse to lunch.   1317: Pt heard vomiting. Wants antinausea med. Provider made aware. Clean wash cloths and tissue given. Orders received to start IV with D5LR, phenergan IVPB, and draw CBC.   1335: up to bathroom. Relinquished care over to primary nurse.

## 2016-09-03 DIAGNOSIS — R110 Nausea: Secondary | ICD-10-CM | POA: Diagnosis not present

## 2016-09-03 DIAGNOSIS — N209 Urinary calculus, unspecified: Secondary | ICD-10-CM | POA: Diagnosis not present

## 2016-09-03 DIAGNOSIS — O2302 Infections of kidney in pregnancy, second trimester: Secondary | ICD-10-CM | POA: Diagnosis not present

## 2016-09-03 DIAGNOSIS — O26832 Pregnancy related renal disease, second trimester: Secondary | ICD-10-CM | POA: Diagnosis not present

## 2016-09-03 DIAGNOSIS — Z3A24 24 weeks gestation of pregnancy: Secondary | ICD-10-CM | POA: Diagnosis not present

## 2016-09-03 DIAGNOSIS — O99112 Other diseases of the blood and blood-forming organs and certain disorders involving the immune mechanism complicating pregnancy, second trimester: Secondary | ICD-10-CM | POA: Diagnosis not present

## 2016-09-03 DIAGNOSIS — D72829 Elevated white blood cell count, unspecified: Secondary | ICD-10-CM | POA: Diagnosis not present

## 2016-09-03 LAB — CBC
HCT: 29.7 % — ABNORMAL LOW (ref 36.0–46.0)
Hemoglobin: 9.7 g/dL — ABNORMAL LOW (ref 12.0–15.0)
MCH: 27.2 pg (ref 26.0–34.0)
MCHC: 32.7 g/dL (ref 30.0–36.0)
MCV: 83.4 fL (ref 78.0–100.0)
Platelets: 208 10*3/uL (ref 150–400)
RBC: 3.56 MIL/uL — ABNORMAL LOW (ref 3.87–5.11)
RDW: 13.8 % (ref 11.5–15.5)
WBC: 12.1 10*3/uL — ABNORMAL HIGH (ref 4.0–10.5)

## 2016-09-03 LAB — COMPREHENSIVE METABOLIC PANEL
ALT: 10 U/L — ABNORMAL LOW (ref 14–54)
AST: 14 U/L — ABNORMAL LOW (ref 15–41)
Albumin: 2.2 g/dL — ABNORMAL LOW (ref 3.5–5.0)
Alkaline Phosphatase: 50 U/L (ref 38–126)
Anion gap: 5 (ref 5–15)
BUN: 7 mg/dL (ref 6–20)
CO2: 23 mmol/L (ref 22–32)
Calcium: 7.6 mg/dL — ABNORMAL LOW (ref 8.9–10.3)
Chloride: 110 mmol/L (ref 101–111)
Creatinine, Ser: 0.5 mg/dL (ref 0.44–1.00)
GFR calc Af Amer: >60 mL/min (ref >60)
GFR calc non Af Amer: >60 mL/min (ref >60)
Glucose, Bld: 95 mg/dL (ref 65–99)
Potassium: 3.5 mmol/L (ref 3.5–5.1)
Sodium: 138 mmol/L (ref 135–145)
Total Bilirubin: 0.3 mg/dL (ref 0.3–1.2)
Total Protein: 4.7 g/dL — ABNORMAL LOW (ref 6.5–8.1)

## 2016-09-03 NOTE — Progress Notes (Signed)
Patient ID: Teresa Holden, female   DOB: 1985/04/20, 31 y.o.   MRN: [id number] No pain today. Passed likely stone. BP 104/71 (BP Location: Right Arm)   Pulse 75   Temp 98.1 F (36.7 C) (Axillary)   Resp 18   Ht 5\' 3"  (1.6 m)   Wt 82.1 kg (181 lb)   LMP  (LMP Unknown)   SpO2 100%   BMI 32.06 kg/m   VSS afebrile entire 24 hrs DC home UCS sent.

## 2016-09-03 NOTE — Progress Notes (Signed)
Pt discharged with printed instructions. Pt verbalized an understanding. No concerns noted. TERRI L TAEUBER, RN 

## 2016-09-03 NOTE — Progress Notes (Signed)
Patient ID: Teresa Holden, female   DOB: 02/09/1985, 31 y.o.   MRN: [id number] HD #2 24 + weeks Pyelo vs Uroltihiasis  S: No pain at this time. Good FM. No bleeding or LOF.  Passed some granular casts ? C/w stone.  Flank pain markedly improved. O: BP 117/65 (BP Location: Right Arm)   Pulse 78   Temp 97.8 F (36.6 C) (Oral)   Resp 18   Ht 5\' 3"  (1.6 m)   Wt 82.1 kg (181 lb)   LMP  (LMP Unknown)   SpO2 100%   BMI 32.06 kg/m   VSS- afebrile  NCAT Neck : supple with FROM Lungs: cTA CV: RRR ABD: gravid , NT NO CVAT EXT: no cords Neuro : non focal SKin: intact  CBC    Component Value Date/Time   WBC 12.1 (H) 09/03/2016 0514   RBC 3.56 (L) 09/03/2016 0514   HGB 9.7 (L) 09/03/2016 0514   HGB 12.9 05/09/2014 1151   HCT 29.7 (L) 09/03/2016 0514   PLT 208 09/03/2016 0514   MCV 83.4 09/03/2016 0514   MCV 81.3 09/07/2014 1048   MCH 27.2 09/03/2016 0514   MCHC 32.7 09/03/2016 0514   RDW 13.8 09/03/2016 0514   LYMPHSABS 1.5 08/28/2011 0828   MONOABS 0.5 08/28/2011 0828   EOSABS 0.1 08/28/2011 0828   BASOSABS 0.0 08/28/2011 0828   IMP: Pyelo vs Urolithiasis- clinically improved. No fever. Pain resolved. ? Stone passage P: Finish 24 hrs of abx and reassess. Ck UCS.

## 2016-09-04 LAB — URINE CULTURE: Culture: NO GROWTH

## 2016-09-05 NOTE — Discharge Summary (Signed)
NAME:  Teresa Holden, Teresa Holden NO.:  [id number]  MEDICAL RECORD NO.:  [id number]  LOCATION:                                 FACILITY:  PHYSICIAN:  Lovenia Kim, M.D.     DATE OF BIRTH:  DATE OF ADMISSION: DATE OF DISCHARGE:                              DISCHARGE SUMMARY   ADMISSION DIAGNOSES: 1. Right flank pain. 2. Hematuria. 3. Leukocytosis.  DISCHARGE DIAGNOSIS:  Presumptive urolithiasis.  COURSE OF HOSPITALIZATION:  The patient was admitted on September 02, 2016, with acute onset of flank pain.  No fever.  No chest pain.  Occasional nausea with pain and hematuria noted by urinalysis.  Ultrasound performed revealed mild-to-moderate hydronephrosis on the left, consistent with possibility of left urolithiasis.  However, due to the elevated white count and positive urinalysis, pyelonephritis was entertained and the patient was admitted for IV antibiotics.  Subsequent to admission over 24 hours, the patient remained afebrile.  Her pain markedly improved, receiving no pain medications.  She passed what looked like granular casts consistent with a kidney stone.  White count went from 16,000 to 12,000.  At this time, she was discharged home. Urine culture pending.  She will follow up in the office in 48 hours.  DISCHARGE DIAGNOSIS:  Urolithiasis.  DISCHARGE MEDICATIONS:  Prenatal vitamins.  FOLLOWUP:  Followup in the office 2 days.     Lovenia Kim, M.D.     RJT/MEDQ  D:  09/05/2016  T:  09/05/2016  Job:  [id number]  cc:   Lovenia Kim, M.D. Fax: [phone]

## 2016-09-12 ENCOUNTER — Other Ambulatory Visit: Payer: Self-pay | Admitting: Obstetrics and Gynecology

## 2016-09-12 DIAGNOSIS — R768 Other specified abnormal immunological findings in serum: Secondary | ICD-10-CM | POA: Diagnosis not present

## 2016-09-12 DIAGNOSIS — M3500 Sicca syndrome, unspecified: Secondary | ICD-10-CM | POA: Diagnosis not present

## 2016-09-12 DIAGNOSIS — O9989 Other specified diseases and conditions complicating pregnancy, childbirth and the puerperium: Secondary | ICD-10-CM | POA: Diagnosis not present

## 2016-09-12 DIAGNOSIS — Z3A25 25 weeks gestation of pregnancy: Secondary | ICD-10-CM | POA: Diagnosis not present

## 2016-09-16 DIAGNOSIS — M549 Dorsalgia, unspecified: Secondary | ICD-10-CM | POA: Diagnosis not present

## 2016-09-16 DIAGNOSIS — Z3A26 26 weeks gestation of pregnancy: Secondary | ICD-10-CM | POA: Diagnosis not present

## 2016-09-16 DIAGNOSIS — O9989 Other specified diseases and conditions complicating pregnancy, childbirth and the puerperium: Secondary | ICD-10-CM | POA: Diagnosis not present

## 2016-09-16 DIAGNOSIS — O26892 Other specified pregnancy related conditions, second trimester: Secondary | ICD-10-CM | POA: Diagnosis not present

## 2016-09-23 DIAGNOSIS — O4402 Placenta previa specified as without hemorrhage, second trimester: Secondary | ICD-10-CM | POA: Diagnosis not present

## 2016-09-23 DIAGNOSIS — Z3689 Encounter for other specified antenatal screening: Secondary | ICD-10-CM | POA: Diagnosis not present

## 2016-09-23 DIAGNOSIS — Z3A27 27 weeks gestation of pregnancy: Secondary | ICD-10-CM | POA: Diagnosis not present

## 2016-09-26 ENCOUNTER — Inpatient Hospital Stay (HOSPITAL_COMMUNITY)
Admission: AD | Admit: 2016-09-26 | Discharge: 2016-09-26 | Disposition: A | Discharge status: Home / Self Care | Payer: 59 | Source: Ambulatory Visit | Attending: Obstetrics and Gynecology | Admitting: Obstetrics and Gynecology

## 2016-09-26 ENCOUNTER — Encounter (HOSPITAL_COMMUNITY): Payer: Self-pay

## 2016-09-26 DIAGNOSIS — O2302 Infections of kidney in pregnancy, second trimester: Secondary | ICD-10-CM | POA: Diagnosis not present

## 2016-09-26 DIAGNOSIS — M3500 Sicca syndrome, unspecified: Secondary | ICD-10-CM | POA: Diagnosis not present

## 2016-09-26 DIAGNOSIS — Y92410 Unspecified street and highway as the place of occurrence of the external cause: Secondary | ICD-10-CM | POA: Diagnosis not present

## 2016-09-26 DIAGNOSIS — O98912 Unspecified maternal infectious and parasitic disease complicating pregnancy, second trimester: Secondary | ICD-10-CM | POA: Insufficient documentation

## 2016-09-26 DIAGNOSIS — O99342 Other mental disorders complicating pregnancy, second trimester: Secondary | ICD-10-CM | POA: Diagnosis not present

## 2016-09-26 DIAGNOSIS — Z3689 Encounter for other specified antenatal screening: Secondary | ICD-10-CM | POA: Diagnosis not present

## 2016-09-26 DIAGNOSIS — T1490XA Injury, unspecified, initial encounter: Secondary | ICD-10-CM | POA: Diagnosis not present

## 2016-09-26 DIAGNOSIS — N12 Tubulo-interstitial nephritis, not specified as acute or chronic: Secondary | ICD-10-CM | POA: Diagnosis not present

## 2016-09-26 DIAGNOSIS — O9A212 Injury, poisoning and certain other consequences of external causes complicating pregnancy, second trimester: Secondary | ICD-10-CM | POA: Diagnosis not present

## 2016-09-26 DIAGNOSIS — Z3A27 27 weeks gestation of pregnancy: Secondary | ICD-10-CM | POA: Diagnosis not present

## 2016-09-26 DIAGNOSIS — F419 Anxiety disorder, unspecified: Secondary | ICD-10-CM | POA: Insufficient documentation

## 2016-09-26 DIAGNOSIS — Z880 Allergy status to penicillin: Secondary | ICD-10-CM | POA: Diagnosis not present

## 2016-09-26 DIAGNOSIS — F909 Attention-deficit hyperactivity disorder, unspecified type: Secondary | ICD-10-CM | POA: Insufficient documentation

## 2016-09-26 LAB — URINALYSIS, ROUTINE W REFLEX MICROSCOPIC
Bilirubin Urine: NEGATIVE
Glucose, UA: NEGATIVE mg/dL
Ketones, ur: NEGATIVE mg/dL
Nitrite: NEGATIVE
Protein, ur: NEGATIVE mg/dL
Specific Gravity, Urine: 1.021 (ref 1.005–1.030)
pH: 6 (ref 5.0–8.0)
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
## 2016-09-26 NOTE — Discharge Instructions (Signed)
Preventing Injuries During Pregnancy Trauma is the most common cause of injury and death in pregnant women. This can also result in serious harm to the baby or even death. How can injuries affect my pregnancy? Your baby is protected in the womb (uterus) by a sac filled with fluid (amniotic sac). Your baby can be harmed if there is a direct blow to your abdomen and pelvis. Trauma may be caused by:  Falls. These are more common in the second and third trimester of pregnancy.  Automobile accidents.  Domestic violence or assault.  Severe burns, such as from fire or electricity.  These injuries can result in:  Tearing of your uterus.  The placenta pulling away from the wall of the uterus (placental abruption).  The amniotic sac breaking open (rupture of membranes).  Blockage or decrease in the blood supply to your baby.  Going into labor earlier than expected.  Severe injuries to other parts of your body, such as your brain, spine, heart, lungs, or other organs.  Minor falls and low-impact automobile accidents do not usually harm your baby, even if they cause a little harm to you. What can I do to lower my risk? Safety  Remove slippery rugs and loose objects on the floor. They increase your risk of tripping or slipping.  Wear comfortable shoes that have a good grip on the sole. Do not wear high-heeled shoes.  Always wear your seat belt properly when riding in a car. Use both the lap and shoulder belt, with the lap belt below your abdomen. Always practice safe driving. Do not ride on a motorcycle while pregnant. Activity  Avoid walking on wet or slippery floors.  Do not participate in rough and violent activities or sports.  Avoid high-risk situations and activities such as: ? Lifting heavy pots of boiling or hot liquids. ? Fixing electrical problems. ? Being near fires or starting fires. General instructions  Take over-the-counter and prescription medicines only as told by  your health care provider.  Know your blood type and the father's blood type in case you develop vaginal bleeding or experience an injury for which a blood transfusion is needed.  Spousal abuse can be a serious cause of trauma during pregnancy. If you are a victim of domestic violence or assault: ? Call your local emergency services (911 in the [place] ? Contact the [employer] Violence Hotline for help and support. When should I seek immediate medical care? Get help right away if:  You fall on your abdomen or experience any serious blow to your abdomen.  You develop stiffness in your neck or pain after a fall or from other trauma.  You develop a headache or vision problems after a fall or from other trauma.  You do not feel the baby moving after a fall or trauma, or you feel that the baby is not moving as much as before the fall or trauma.  You have been the victim of domestic violence or any other kind of physical attack.  You have been in a car accident.  You develop vaginal bleeding.  You have fluid leaking from the vagina.  You develop uterine contractions. Symptoms include pelvic cramping, pain, or serious low back pain.  You become weak, faint, or have uncontrolled vomiting after trauma.  You have a serious burn. This includes burns to the face, neck, hands, or genitals, or burns greater than the size of your palm anywhere else.  Summary  Trauma is the most common cause of  injury and death in pregnant women and can also lead to injury or death of the baby.  Falls, automobile accidents, domestic violence or assault, and severe burns can injure you or your baby. Make sure to get medical help right away if you experience any of these during your pregnancy.  Take steps to prevent slips or falls in your home, such as avoiding slippery floors and removing loose rugs.  Always wear your seat belt properly when riding in a car. Practice safe driving. This information is  not intended to replace advice given to you by your health care provider. Make sure you discuss any questions you have with your health care provider. Document Released: 02/29/2004 Document Revised: 01/31/2016 Document Reviewed: 01/31/2016 Elsevier Interactive Patient Education  2017 [employer].

## 2016-10-01 DIAGNOSIS — O9989 Other specified diseases and conditions complicating pregnancy, childbirth and the puerperium: Secondary | ICD-10-CM | POA: Diagnosis not present

## 2016-10-01 DIAGNOSIS — R399 Unspecified symptoms and signs involving the genitourinary system: Secondary | ICD-10-CM | POA: Diagnosis not present

## 2016-10-01 DIAGNOSIS — R300 Dysuria: Secondary | ICD-10-CM | POA: Diagnosis not present

## 2016-10-01 DIAGNOSIS — M3500 Sicca syndrome, unspecified: Secondary | ICD-10-CM | POA: Diagnosis not present

## 2016-10-01 DIAGNOSIS — R768 Other specified abnormal immunological findings in serum: Secondary | ICD-10-CM | POA: Diagnosis not present

## 2016-10-01 DIAGNOSIS — Z3A28 28 weeks gestation of pregnancy: Secondary | ICD-10-CM | POA: Diagnosis not present

## 2016-10-01 LAB — OB RESULTS CONSOLE GBS: GBS: NEGATIVE

## 2016-10-01 MED FILL — NITROFURANTOIN MONO-MCR 100: 100 | 28 days supply | Qty: 35 | Fill #0

## 2016-10-08 DIAGNOSIS — Z3689 Encounter for other specified antenatal screening: Secondary | ICD-10-CM | POA: Diagnosis not present

## 2016-10-08 DIAGNOSIS — Z23 Encounter for immunization: Secondary | ICD-10-CM | POA: Diagnosis not present

## 2016-11-19 DIAGNOSIS — Z3685 Encounter for antenatal screening for Streptococcus B: Secondary | ICD-10-CM | POA: Diagnosis not present

## 2016-11-26 DIAGNOSIS — O3663X0 Maternal care for excessive fetal growth, third trimester, not applicable or unspecified: Secondary | ICD-10-CM | POA: Diagnosis not present

## 2016-11-26 DIAGNOSIS — Z3A36 36 weeks gestation of pregnancy: Secondary | ICD-10-CM | POA: Diagnosis not present

## 2016-12-03 DIAGNOSIS — Z3A37 37 weeks gestation of pregnancy: Secondary | ICD-10-CM | POA: Diagnosis not present

## 2016-12-03 DIAGNOSIS — O133 Gestational [pregnancy-induced] hypertension without significant proteinuria, third trimester: Secondary | ICD-10-CM | POA: Diagnosis not present

## 2016-12-04 DIAGNOSIS — O133 Gestational [pregnancy-induced] hypertension without significant proteinuria, third trimester: Secondary | ICD-10-CM | POA: Diagnosis not present

## 2016-12-05 ENCOUNTER — Other Ambulatory Visit: Payer: Self-pay | Admitting: Obstetrics and Gynecology

## 2016-12-06 ENCOUNTER — Encounter (HOSPITAL_COMMUNITY): Payer: Self-pay | Admitting: Anesthesiology

## 2016-12-06 ENCOUNTER — Inpatient Hospital Stay (HOSPITAL_COMMUNITY): Payer: 59 | Admitting: Anesthesiology

## 2016-12-06 ENCOUNTER — Inpatient Hospital Stay (HOSPITAL_COMMUNITY)
Admission: AD | Admit: 2016-12-06 | Discharge: 2016-12-07 | DRG: 807 | Disposition: A | Discharge status: Home / Self Care | Payer: 59 | Source: Ambulatory Visit | Attending: Obstetrics and Gynecology | Admitting: Obstetrics and Gynecology

## 2016-12-06 ENCOUNTER — Encounter (HOSPITAL_COMMUNITY): Payer: Self-pay

## 2016-12-06 DIAGNOSIS — D649 Anemia, unspecified: Secondary | ICD-10-CM | POA: Diagnosis present

## 2016-12-06 DIAGNOSIS — O2302 Infections of kidney in pregnancy, second trimester: Secondary | ICD-10-CM

## 2016-12-06 DIAGNOSIS — O9902 Anemia complicating childbirth: Secondary | ICD-10-CM | POA: Diagnosis present

## 2016-12-06 DIAGNOSIS — Z3A00 Weeks of gestation of pregnancy not specified: Secondary | ICD-10-CM | POA: Diagnosis not present

## 2016-12-06 DIAGNOSIS — O164 Unspecified maternal hypertension, complicating childbirth: Secondary | ICD-10-CM | POA: Diagnosis not present

## 2016-12-06 DIAGNOSIS — Z3A38 38 weeks gestation of pregnancy: Secondary | ICD-10-CM

## 2016-12-06 DIAGNOSIS — O139 Gestational [pregnancy-induced] hypertension without significant proteinuria, unspecified trimester: Secondary | ICD-10-CM | POA: Diagnosis present

## 2016-12-06 DIAGNOSIS — O134 Gestational [pregnancy-induced] hypertension without significant proteinuria, complicating childbirth: Secondary | ICD-10-CM | POA: Diagnosis present

## 2016-12-06 DIAGNOSIS — Z880 Allergy status to penicillin: Secondary | ICD-10-CM | POA: Diagnosis not present

## 2016-12-06 HISTORY — DX: Adverse effect of unspecified general anesthetics, subsequent encounter: T41.205D

## 2016-12-06 LAB — COMPREHENSIVE METABOLIC PANEL
ALT: 11 U/L — ABNORMAL LOW (ref 14–54)
AST: 17 U/L (ref 15–41)
Albumin: 2.7 g/dL — ABNORMAL LOW (ref 3.5–5.0)
Alkaline Phosphatase: 204 U/L — ABNORMAL HIGH (ref 38–126)
Anion gap: 10 (ref 5–15)
BUN: 14 mg/dL (ref 6–20)
CO2: 22 mmol/L (ref 22–32)
Calcium: 9 mg/dL (ref 8.9–10.3)
Chloride: 104 mmol/L (ref 101–111)
Creatinine, Ser: 0.96 mg/dL (ref 0.44–1.00)
GFR calc Af Amer: >60 mL/min (ref >60)
GFR calc non Af Amer: >60 mL/min (ref >60)
Glucose, Bld: 91 mg/dL (ref 65–99)
Potassium: 4.1 mmol/L (ref 3.5–5.1)
Sodium: 136 mmol/L (ref 135–145)
Total Bilirubin: 0.4 mg/dL (ref 0.3–1.2)
Total Protein: 6.1 g/dL — ABNORMAL LOW (ref 6.5–8.1)

## 2016-12-06 LAB — CBC
HCT: 32.2 % — ABNORMAL LOW (ref 36.0–46.0)
Hemoglobin: 10.3 g/dL — ABNORMAL LOW (ref 12.0–15.0)
MCH: 24 pg — ABNORMAL LOW (ref 26.0–34.0)
MCHC: 32 g/dL (ref 30.0–36.0)
MCV: 75.1 fL — ABNORMAL LOW (ref 78.0–100.0)
Platelets: 176 10*3/uL (ref 150–400)
RBC: 4.29 MIL/uL (ref 3.87–5.11)
RDW: 15.3 % (ref 11.5–15.5)
WBC: 8.9 10*3/uL (ref 4.0–10.5)

## 2016-12-06 LAB — TYPE AND SCREEN
ABO/RH(D): A POS
Antibody Screen: NEGATIVE

## 2016-12-06 LAB — URIC ACID: Uric Acid, Serum: 6.7 mg/dL — ABNORMAL HIGH (ref 2.3–6.6)

## 2016-12-06 LAB — RPR: RPR Ser Ql: NONREACTIVE

## 2016-12-06 MED ORDER — LACTATED RINGERS IV SOLN: 500.0000 mL | INTRAVENOUS | Status: DC | PRN

## 2016-12-06 MED ORDER — ACETAMINOPHEN 325 MG PO TABS: 650.0000 mg | ORAL_TABLET | ORAL | Status: DC | PRN

## 2016-12-06 MED ORDER — ONDANSETRON HCL 4 MG/2ML IJ SOLN: 4.0000 mg | INTRAMUSCULAR | Status: DC | PRN

## 2016-12-06 MED ORDER — OXYTOCIN BOLUS FROM INFUSION
500.0000 mL | Freq: Once | INTRAVENOUS | Status: AC
  Administered 2016-12-06: 500 mL via INTRAVENOUS

## 2016-12-06 MED ORDER — DIPHENHYDRAMINE HCL 25 MG PO CAPS: 25.0000 mg | ORAL_CAPSULE | Freq: Four times a day (QID) | ORAL | Status: DC | PRN

## 2016-12-06 MED ORDER — EPHEDRINE 5 MG/ML INJ
10.0000 mg | INTRAVENOUS | Status: DC | PRN
  Filled 2016-12-06: qty 2

## 2016-12-06 MED ORDER — ZOLPIDEM TARTRATE 5 MG PO TABS: 5.0000 mg | ORAL_TABLET | Freq: Every evening | ORAL | Status: DC | PRN

## 2016-12-06 MED ORDER — METHYLERGONOVINE MALEATE 0.2 MG PO TABS: 0.2000 mg | ORAL_TABLET | ORAL | Status: DC | PRN

## 2016-12-06 MED ORDER — OXYTOCIN 40 UNITS IN LACTATED RINGERS INFUSION - SIMPLE MED
1.0000 m[IU]/min | INTRAVENOUS | Status: DC
  Administered 2016-12-06: 2 m[IU]/min via INTRAVENOUS
  Filled 2016-12-06: qty 1000

## 2016-12-06 MED ORDER — LIDOCAINE HCL (PF) 1 % IJ SOLN
INTRAMUSCULAR | Status: DC | PRN
  Administered 2016-12-06: 6 mL via EPIDURAL
  Administered 2016-12-06: 4 mL

## 2016-12-06 MED ORDER — SENNOSIDES-DOCUSATE SODIUM 8.6-50 MG PO TABS
2.0000 | ORAL_TABLET | ORAL | Status: DC
  Administered 2016-12-07: 2 via ORAL
  Filled 2016-12-06: qty 2

## 2016-12-06 MED ORDER — COCONUT OIL OIL
1.0000 "application " | TOPICAL_OIL | Status: DC | PRN
  Administered 2016-12-07: 1 via TOPICAL
  Filled 2016-12-06: qty 120

## 2016-12-06 MED ORDER — IBUPROFEN 600 MG PO TABS
600.0000 mg | ORAL_TABLET | Freq: Four times a day (QID) | ORAL | Status: DC
  Administered 2016-12-06 – 2016-12-07 (×4): 600 mg via ORAL
  Filled 2016-12-06 (×4): qty 1

## 2016-12-06 MED ORDER — ONDANSETRON HCL 4 MG/2ML IJ SOLN
4.0000 mg | Freq: Four times a day (QID) | INTRAMUSCULAR | Status: DC | PRN
  Administered 2016-12-06: 4 mg via INTRAVENOUS
  Filled 2016-12-06: qty 2

## 2016-12-06 MED ORDER — TETANUS-DIPHTH-ACELL PERTUSSIS 5-2.5-18.5 LF-MCG/0.5 IM SUSP: 0.5000 mL | Freq: Once | INTRAMUSCULAR | Status: DC

## 2016-12-06 MED ORDER — LACTATED RINGERS IV SOLN
INTRAVENOUS | Status: DC
  Administered 2016-12-06: 09:00:00 via INTRAVENOUS

## 2016-12-06 MED ORDER — TERBUTALINE SULFATE 1 MG/ML IJ SOLN
0.2500 mg | Freq: Once | INTRAMUSCULAR | Status: DC | PRN
  Filled 2016-12-06: qty 1

## 2016-12-06 MED ORDER — SOD CITRATE-CITRIC ACID 500-334 MG/5ML PO SOLN: 30.0000 mL | ORAL | Status: DC | PRN

## 2016-12-06 MED ORDER — DIBUCAINE 1 % RE OINT: 1.0000 "application " | TOPICAL_OINTMENT | RECTAL | Status: DC | PRN

## 2016-12-06 MED ORDER — OXYTOCIN 40 UNITS IN LACTATED RINGERS INFUSION - SIMPLE MED: 2.5000 [IU]/h | INTRAVENOUS | Status: DC

## 2016-12-06 MED ORDER — LACTATED RINGERS IV SOLN: 500.0000 mL | Freq: Once | INTRAVENOUS | Status: DC

## 2016-12-06 MED ORDER — SIMETHICONE 80 MG PO CHEW
80.0000 mg | CHEWABLE_TABLET | ORAL | Status: DC | PRN
  Administered 2016-12-07: 80 mg via ORAL
  Filled 2016-12-06: qty 1

## 2016-12-06 MED ORDER — OXYCODONE-ACETAMINOPHEN 5-325 MG PO TABS: 2.0000 | ORAL_TABLET | ORAL | Status: DC | PRN

## 2016-12-06 MED ORDER — DIPHENHYDRAMINE HCL 50 MG/ML IJ SOLN: 12.5000 mg | INTRAMUSCULAR | Status: DC | PRN

## 2016-12-06 MED ORDER — BENZOCAINE-MENTHOL 20-0.5 % EX AERO
1.0000 "application " | INHALATION_SPRAY | CUTANEOUS | Status: DC | PRN
  Administered 2016-12-06: 1 via TOPICAL
  Filled 2016-12-06: qty 56

## 2016-12-06 MED ORDER — FAMOTIDINE 20 MG PO TABS
20.0000 mg | ORAL_TABLET | Freq: Two times a day (BID) | ORAL | Status: DC | PRN
  Administered 2016-12-07: 20 mg via ORAL
  Filled 2016-12-06: qty 1

## 2016-12-06 MED ORDER — WITCH HAZEL-GLYCERIN EX PADS: 1.0000 "application " | MEDICATED_PAD | CUTANEOUS | Status: DC | PRN

## 2016-12-06 MED ORDER — PRENATAL MULTIVITAMIN CH
1.0000 | ORAL_TABLET | Freq: Every day | ORAL | Status: DC
  Administered 2016-12-07: 1 via ORAL
  Filled 2016-12-06: qty 1

## 2016-12-06 MED ORDER — FENTANYL 2.5 MCG/ML BUPIVACAINE 1/10 % EPIDURAL INFUSION (WH - ANES)
14.0000 mL/h | INTRAMUSCULAR | Status: DC | PRN
  Administered 2016-12-06: 14 mL/h via EPIDURAL
  Filled 2016-12-06: qty 100

## 2016-12-06 MED ORDER — METHYLERGONOVINE MALEATE 0.2 MG/ML IJ SOLN: 0.2000 mg | INTRAMUSCULAR | Status: DC | PRN

## 2016-12-06 MED ORDER — LIDOCAINE HCL (PF) 1 % IJ SOLN
30.0000 mL | INTRAMUSCULAR | Status: DC | PRN
  Filled 2016-12-06: qty 30

## 2016-12-06 MED ORDER — PHENYLEPHRINE 40 MCG/ML (10ML) SYRINGE FOR IV PUSH (FOR BLOOD PRESSURE SUPPORT)
80.0000 ug | PREFILLED_SYRINGE | INTRAVENOUS | Status: DC | PRN
  Filled 2016-12-06: qty 5
  Filled 2016-12-06: qty 10

## 2016-12-06 MED ORDER — ONDANSETRON HCL 4 MG PO TABS: 4.0000 mg | ORAL_TABLET | ORAL | Status: DC | PRN

## 2016-12-06 MED ORDER — PHENYLEPHRINE 40 MCG/ML (10ML) SYRINGE FOR IV PUSH (FOR BLOOD PRESSURE SUPPORT)
80.0000 ug | PREFILLED_SYRINGE | INTRAVENOUS | Status: DC | PRN
  Filled 2016-12-06: qty 5

## 2016-12-06 MED ORDER — OXYCODONE-ACETAMINOPHEN 5-325 MG PO TABS
1.0000 | ORAL_TABLET | ORAL | Status: DC | PRN
  Administered 2016-12-07 (×3): 1 via ORAL
  Filled 2016-12-06 (×3): qty 1

## 2016-12-06 NOTE — Anesthesia Preprocedure Evaluation (Signed)
Anesthesia Evaluation  Patient identified by MRN, date of birth, ID band Patient awake    Reviewed: Allergy & Precautions, H&P , Patient's Chart, lab work & pertinent test results, reviewed documented beta blocker date and time   Airway Mallampati: II  TM Distance: >3 FB Neck ROM: full    Dental no notable dental hx.    Pulmonary    Pulmonary exam normal breath sounds clear to auscultation       Cardiovascular hypertension,  Rhythm:regular Rate:Normal     Neuro/Psych    GI/Hepatic   Endo/Other    Renal/GU      Musculoskeletal   Abdominal   Peds  Hematology   Anesthesia Other Findings Sjogren's disease [facility])  Reproductive/Obstetrics                             Anesthesia Physical Anesthesia Plan  ASA: III  Anesthesia Plan:    Post-op Pain Management:    Induction:   PONV Risk Score and Plan:   Airway Management Planned:   Additional Equipment:   Intra-op Plan:   Post-operative Plan:   Informed Consent: I have reviewed the patients History and Physical, chart, labs and discussed the procedure including the risks, benefits and alternatives for the proposed anesthesia with the patient or authorized representative who has indicated his/her understanding and acceptance.   Dental Advisory Given  Plan Discussed with: CRNA and Surgeon  Anesthesia Plan Comments:         Anesthesia Quick Evaluation

## 2016-12-06 NOTE — H&P (Signed)
Teresa Holden is a 31 y.o. female presenting for IOL for gestational HTN and serum creatinine 1.09. No s/s PEC.  OB History    Gravida Para Term Preterm AB Living   2 1       1    SAB TAB Ectopic Multiple Live Births           1     Past Medical History:  Diagnosis Date  . ADD (attention deficit disorder with hyperactivity)   . Adverse effect of unspecified general anesthetics, subsequent encounter    difficulty waking up from anesthesia  . Anxiety   . Family history of anesthesia complication    N&V  . Migraines   . PONV (postoperative nausea and vomiting)   . Sjogren's disease [facility])    Past Surgical History:  Procedure Laterality Date  . BREAST ENHANCEMENT SURGERY  2013   Dr. Lenon Curt  . CHOLECYSTECTOMY N/A 03/07/2016   Procedure: LAPAROSCOPIC CHOLECYSTECTOMY;  Surgeon: Coralie Keens, MD;  Location: [facility];  Service: General;  Laterality: N/A;  . HYSTEROSCOPY N/A 10/15/2013   Procedure: HYSTEROSCOPY with removal of IUD;  Surgeon: Lyman Speller, MD;  Location: [facility];  Service: Gynecology;  Laterality: N/A;  . WISDOM TOOTH EXTRACTION Bilateral 2008   Family History: family history includes Diabetes in her paternal grandfather; Heart disease in her maternal grandfather and paternal grandfather; Sarcoidosis in her mother. Social History:  reports that she has never smoked. She has never used smokeless tobacco. She reports that she does not drink alcohol or use drugs.     Maternal Diabetes: No Genetic Screening: Normal Maternal Ultrasounds/Referrals: Normal Fetal Ultrasounds or other Referrals:  None Maternal Substance Abuse:  No Significant Maternal Medications:  None Significant Maternal Lab Results:  None Other Comments:  None  Review of Systems  Constitutional: Negative.   All other systems reviewed and are negative.  Maternal Medical History:  Reason for admission: Contractions.   Contractions: Frequency: irregular.   Perceived severity  is mild.    Fetal activity: Perceived fetal activity is normal.   Last perceived fetal movement was within the past hour.    Prenatal complications: PIH.   Prenatal Complications - Diabetes: none.    Dilation: 3 Effacement (%): 50 Station: -2 Exam by:: taavon Blood pressure 125/63, pulse 76, temperature 97.9 F (36.6 C), temperature source Oral, resp. rate 20, height 5\' 3"  (1.6 m), weight 87.2 kg (192 lb 4 oz), SpO2 96 %. Maternal Exam:  Uterine Assessment: Contraction strength is mild.  Contraction frequency is irregular.   Abdomen: Patient reports no abdominal tenderness. Fetal presentation: vertex  Introitus: Normal vulva. Normal vagina.  Ferning test: positive.  Nitrazine test: positive. Amniotic fluid character: clear.  Pelvis: adequate for delivery.   Cervix: Cervix evaluated by digital exam.     Physical Exam  Nursing note and vitals reviewed. Constitutional: She is oriented to person, place, and time. She appears well-developed and well-nourished.  HENT:  Head: Normocephalic and atraumatic.  Neck: Normal range of motion. Neck supple.  Cardiovascular: Normal rate and regular rhythm.   Respiratory: Effort normal and breath sounds normal.  GI: Soft. Bowel sounds are normal.  Genitourinary: Vagina normal and uterus normal.  Musculoskeletal: Normal range of motion.  Neurological: She is alert and oriented to person, place, and time. She has normal reflexes.  Skin: Skin is warm and dry.  Psychiatric: She has a normal mood and affect.    Prenatal labs: ABO, Rh: --/--/A POS (11/02 1025) Antibody: NEG (  11/02 0904) Rubella: Immune (04/18 0000) RPR: Nonreactive (04/18 0000)  HBsAg: Negative (04/18 0000)  HIV: Non-reactive (04/18 0000)  GBS: Negative (08/28 0000)   Assessment/Plan: 38 weeks. Gestational HTN IOL   TAAVON,RICHARD J 12/06/2016, 3:37 PM

## 2016-12-06 NOTE — Anesthesia Pain Management Evaluation Note (Signed)
  CRNA Pain Management Visit Note  Patient: Teresa Holden, 31 y.o., female  "Hello I am a member of the anesthesia team at [facility]. We have an anesthesia team available at all times to provide care throughout the hospital, including epidural management and anesthesia for C-section. I don't know your plan for the delivery whether it a natural birth, water birth, IV sedation, nitrous supplementation, doula or epidural, but we want to meet your pain goals."   1.Was your pain managed to your expectations on prior hospitalizations?   Yes   2.What is your expectation for pain management during this hospitalization?     Epidural  3.How can we help you reach that goal? epidural  Record the patient's initial score and the patient's pain goal.   Pain: 0  Pain Goal: 0 [facility] wants you to be able to say your pain was always managed very well.  GILBERT,MELINDA 12/06/2016

## 2016-12-06 NOTE — Anesthesia Procedure Notes (Signed)
Epidural Patient location during procedure: OB  Staffing Anesthesiologist: Lyndle Herrlich  Preanesthetic Checklist Completed: patient identified, pre-op evaluation, timeout performed, IV checked, risks and benefits discussed and monitors and equipment checked  Epidural Patient position: sitting Prep: DuraPrep Patient monitoring: blood pressure and continuous pulse ox Approach: right paramedian Location: L3-L4 Injection technique: LOR air  Needle:  Needle type: Tuohy  Needle gauge: 17 G Needle insertion depth: 5 cm Catheter type: closed end flexible Catheter size: 19 Gauge Catheter at skin depth: 10 cm Test dose: negative  Assessment Sensory level: T8  Additional Notes   Dosing of Epidural:  1st dose, through catheter ............................................Marland Kitchen  Xylocaine 40 mg  2nd dose, through catheter, after waiting 3 minutes........Marland KitchenXylocaine 60 mg    As each dose occurred, patient was free of IV sx; and patient exhibited no evidence of SA injection.  Patient is more comfortable after epidural dosed. Please see RN's note for documentation of vital signs,and FHR which are stable.  Patient reminded not to try to ambulate with numb legs, and that an RN must be present when she attempts to get up.

## 2016-12-06 NOTE — MAU Note (Signed)
Urine in lab. Not enough to do culture.

## 2016-12-06 NOTE — MAU Note (Signed)
Pt is here for a scheduled induction.

## 2016-12-06 NOTE — Progress Notes (Signed)
Teresa Holden is a 31 y.o. G2P1 at [redacted]w[redacted]d by LMP admitted for induction of labor due to Hypertension.  Subjective: occ pressure  Objective: BP 130/81   Pulse 82   Temp 97.8 F (36.6 C) (Oral)   Resp 16   Ht 5\' 3"  (1.6 m)   Wt 87.2 kg (192 lb 4 oz)   LMP  (LMP Unknown)   SpO2 99%   BMI 34.06 kg/m  No intake/output data recorded. No intake/output data recorded.  FHT:  FHR: 155 bpm, variability: moderate,  accelerations:  Present,  decelerations:  Absent UC:   regular, every 3 minutes SVE:   Dilation: 7 Effacement (%): 50 Station: -2 Exam by:: taavon  Labs: Lab Results  Component Value Date   WBC 8.9 12/06/2016   HGB 10.3 (L) 12/06/2016   HCT 32.2 (L) 12/06/2016   MCV 75.1 (L) 12/06/2016   PLT 176 12/06/2016    Assessment / Plan: Induction of labor due to gestational hypertension,  progressing well on pitocin  Labor: Progressing normally Preeclampsia:  no signs or symptoms of toxicity Fetal Wellbeing:  Category I Pain Control:  Epidural I/D:  n/a Anticipated MOD:  NSVD  TAAVON,RICHARD J 12/06/2016, 6:55 PM

## 2016-12-07 DIAGNOSIS — O9902 Anemia complicating childbirth: Secondary | ICD-10-CM | POA: Diagnosis present

## 2016-12-07 LAB — CBC
HCT: 30.2 % — ABNORMAL LOW (ref 36.0–46.0)
Hemoglobin: 9.7 g/dL — ABNORMAL LOW (ref 12.0–15.0)
MCH: 24.1 pg — ABNORMAL LOW (ref 26.0–34.0)
MCHC: 32.1 g/dL (ref 30.0–36.0)
MCV: 74.9 fL — ABNORMAL LOW (ref 78.0–100.0)
Platelets: 155 10*3/uL (ref 150–400)
RBC: 4.03 MIL/uL (ref 3.87–5.11)
RDW: 15.3 % (ref 11.5–15.5)
WBC: 11.2 10*3/uL — ABNORMAL HIGH (ref 4.0–10.5)

## 2016-12-07 MED ORDER — MAGNESIUM OXIDE 400 (241.3 MG) MG PO TABS
400.0000 mg | ORAL_TABLET | Freq: Every day | ORAL | Status: DC
  Administered 2016-12-07: 400 mg via ORAL
  Filled 2016-12-07 (×2): qty 1

## 2016-12-07 MED ORDER — COCONUT OIL OIL: 1.0000 "application " | TOPICAL_OIL | 0 refills | Status: DC | PRN

## 2016-12-07 MED ORDER — BENZOCAINE-MENTHOL 20-0.5 % EX AERO: 1.0000 "application " | INHALATION_SPRAY | CUTANEOUS | Status: DC | PRN

## 2016-12-07 MED ORDER — ACETAMINOPHEN 325 MG PO TABS: 650.0000 mg | ORAL_TABLET | ORAL | Status: DC | PRN

## 2016-12-07 MED ORDER — POLYSACCHARIDE IRON COMPLEX 150 MG PO CAPS: 150.0000 mg | ORAL_CAPSULE | Freq: Every day | ORAL | Status: DC

## 2016-12-07 MED ORDER — IBUPROFEN 600 MG PO TABS: 600.0000 mg | ORAL_TABLET | Freq: Four times a day (QID) | ORAL | 0 refills | Status: DC

## 2016-12-07 MED ORDER — MAGNESIUM OXIDE 400 (241.3 MG) MG PO TABS: 400.0000 mg | ORAL_TABLET | Freq: Every day | ORAL | Status: DC

## 2016-12-07 MED ORDER — POLYSACCHARIDE IRON COMPLEX 150 MG PO CAPS
150.0000 mg | ORAL_CAPSULE | Freq: Every day | ORAL | Status: DC
  Administered 2016-12-07: 150 mg via ORAL
  Filled 2016-12-07: qty 1

## 2016-12-07 NOTE — Progress Notes (Signed)
MOB was referred for history of depression/anxiety.  Referral is screened out by Clinical Social Worker because none of the following criteria appear to apply and there are no reports impacting the pregnancy or her transition to the postpartum period.  CSW does not deem it clinically necessary to further investigate at this time.   -History of anxiety/depression during this pregnancy, or of post-partum depression.  - Diagnosis of anxiety and/or depression within last 3 years.-  - History of depression due to pregnancy loss/loss of child or -MOB's symptoms are currently being treated with medication and/or therapy.  Please contact the Clinical Social Worker if needs arise or upon MOB request.    Oda Cogan, MSW, [facility]  Office: [phone]

## 2016-12-07 NOTE — Anesthesia Postprocedure Evaluation (Signed)
Anesthesia Post Note  Patient: KALEIA LONGHI  Procedure(s) Performed: AN AD HOC LABOR EPIDURAL     Patient location during evaluation: Mother Baby Anesthesia Type: Epidural Level of consciousness: awake and alert and oriented Pain management: satisfactory to patient Vital Signs Assessment: post-procedure vital signs reviewed and stable Respiratory status: respiratory function stable Cardiovascular status: stable Postop Assessment: no headache, no backache, epidural receding, patient able to bend at knees, no signs of nausea or vomiting and adequate PO intake Anesthetic complications: no    Last Vitals:  Vitals:   12/07/16 0210 12/07/16 0605  BP: 126/75 132/83  Pulse: 71 93  Resp: 18 18  Temp: 36.8 C 36.4 C  SpO2:      Last Pain:  Vitals:   12/07/16 0651  TempSrc:   PainSc: 5    Pain Goal:                 FUSSELL,WYNN

## 2016-12-07 NOTE — Lactation Note (Addendum)
This note was copied from a baby's chart. Lactation Consultation Note Mom has a 31 yr old that she BF for 7 months. Mom has a breast augmentation from [facility] to EEE and lift. Mom stated she had a lot of skin removed. Mom has large breast, can hand express colostrum well. Nipples are short shaft. Rt. Shorter than Lt. Shells given to wear to evert, hand pump given to pre-pump to evert if needed. Mom denies painful latches. Mom stated difficulty latching baby to Rt. Breast. Assisted in latching in football hold to Rt. Breast. Taught "C" hold. Baby BF well.  Encouraged mom to call for assistance if BF starting to hurt, and nipples getting sore. LC feels that baby may need NS when breast starts to fill and areolas not compressible. Stressed importance of wearing shells. Mom applied. Mom shown how to use DEBP & how to disassemble, clean, & reassemble parts. Mom knows to pump q3h for 15-20 min. Mom pumping as LC leaving rm. Baby spitty and gagging.  Newborn behavior, STS, I&O, cluster feeding, supply and demand.  Mom encouraged to feed baby 8-12 times/24 hours and with feeding cues.  [employer] brochure given w/resources, support groups and [place] services. Mom [employer] employee, wishes to get a DEBP before d/c home.   Patient Name: Teresa Holden FGBMS'X Date: 12/07/2016 Reason for consult: Initial assessment   Maternal Data Has patient been taught Hand Expression?: Yes Does the patient have breastfeeding experience prior to this delivery?: Yes  Feeding Feeding Type: Breast Fed Length of feed: 15 min  LATCH Score Latch: Repeated attempts needed to sustain latch, nipple held in mouth throughout feeding, stimulation needed to elicit sucking reflex.  Audible Swallowing: None  Type of Nipple: Everted at rest and after stimulation  Comfort (Breast/Nipple): Soft / non-tender  Hold (Positioning): Assistance needed to correctly position infant at breast and maintain latch.  LATCH Score:  6  Interventions Interventions: Breast feeding basics reviewed;Breast compression;Assisted with latch;Adjust position;Hand pump;Skin to skin;Support pillows;DEBP;Breast massage;Position options;Hand express;Expressed milk;Pre-pump if needed;Shells  Lactation Tools Discussed/Used Tools: Shells;Pump Shell Type: Inverted Breast pump type: Double-Electric Breast Pump;Manual Pump Review: Setup, frequency, and cleaning;Milk Storage Initiated by:: Allayne Stack RN IBCLC Date initiated:: 12/07/16   Consult Status Consult Status: Follow-up Date: 12/08/16 Follow-up type: In-patient    Theodoro Kalata 12/07/2016, 6:34 AM

## 2016-12-07 NOTE — Lactation Note (Signed)
This note was copied from a baby's chart. Lactation Consultation Note  Patient Name: Teresa Holden WGNFA'O Date: 12/07/2016 Reason for consult: Follow-up assessment Mom states feedings are going much better.  Possible discharge this evening.  Observed mom latch baby using cross cradle hold.  Mom confident and baby latched with ease.  Pump In Style provided for home use.[employer] employee).  Lactation outpatient services reviewed and encouraged prn.  Maternal Data    Feeding Feeding Type: Breast Fed Length of feed: 15 min  LATCH Score Latch: Grasps breast easily, tongue down, lips flanged, rhythmical sucking.  Audible Swallowing: A few with stimulation  Type of Nipple: Everted at rest and after stimulation  Comfort (Breast/Nipple): Soft / non-tender  Hold (Positioning): No assistance needed to correctly position infant at breast.  LATCH Score: 9  Interventions    Lactation Tools Discussed/Used     Consult Status Consult Status: Complete    Ave Filter 12/07/2016, 1:35 PM

## 2016-12-07 NOTE — Discharge Summary (Signed)
Obstetric Discharge Summary Reason for Admission: onset of labor and gestational hypertension Prenatal Procedures: NST and ultrasound Intrapartum Procedures: spontaneous vaginal delivery and epidural Postpartum Procedures: none Complications-Operative and Postpartum: 2nd degree perineal laceration Hemoglobin  Date Value Ref Range Status  12/07/2016 9.7 (L) 12.0 - 15.0 g/dL Final   Hemoglobin, fingerstick  Date Value Ref Range Status  05/09/2014 12.9 12.0 - 16.0 g/dL Final   HCT  Date Value Ref Range Status  12/07/2016 30.2 (L) 36.0 - 46.0 % Final    Physical Exam:  See progress note Discharge Diagnoses: Term Pregnancy-delivered and gestational hypertension resolving, maternal anemia  Discharge Information: Date: 12/07/2016 Activity: pelvic rest Diet: routine Medications:  Allergies as of 12/07/2016      Reactions   Penicillins Rash   Has patient had a PCN reaction causing immediate rash, facial/tongue/throat swelling, SOB or lightheadedness with hypotension: Yes Has patient had a PCN reaction causing severe rash involving mucus membranes or skin necrosis: Yes Has patient had a PCN reaction that required hospitalization: No Has patient had a PCN reaction occurring within the last 10 years: No If all of the above answers are "NO", then may proceed with Cephalosporin use.      Medication List    TAKE these medications   acetaminophen 325 MG tablet Commonly known as:  TYLENOL Take 2 tablets (650 mg total) by mouth every 4 (four) hours as needed (for pain scale < 4).   benzocaine-Menthol 20-0.5 % Aero Commonly known as:  DERMOPLAST Apply 1 application topically as needed for irritation (perineal discomfort).   calcium carbonate 500 MG chewable tablet Commonly known as:  TUMS - dosed in mg elemental calcium Chew 1 tablet by mouth 2 (two) times daily as needed for indigestion or heartburn.   coconut oil Oil Apply 1 application topically as needed.   ibuprofen 600 MG  tablet Commonly known as:  ADVIL,MOTRIN Take 1 tablet (600 mg total) by mouth every 6 (six) hours.   iron polysaccharides 150 MG capsule Commonly known as:  NIFEREX Take 1 capsule (150 mg total) by mouth daily.   magnesium oxide 400 (241.3 Mg) MG tablet Commonly known as:  MAG-OX Take 1 tablet (400 mg total) by mouth daily.   prenatal multivitamin Tabs tablet Take 1 tablet by mouth daily at 12 noon.   ranitidine 150 MG capsule Commonly known as:  ZANTAC Take 150 mg by mouth daily.            Discharge Care Instructions  (From admission, onward)        Start     Ordered   12/07/16 0000  Discharge wound care:    Comments:  Sitz baths 2 times /day with warm water x 1 week   12/07/16 1731     Condition: stable Instructions: refer to practice specific booklet Discharge to: home Follow-up Information    Brien Few, MD. Schedule an appointment as soon as possible for a visit in 6 week(s).   Specialty:  Obstetrics and Gynecology Contact information: [place] [address] [phone]           Newborn Data: Live born female Whitney Birth Weight: 7 lb 6.7 oz (3365 g) APGAR: 39, 9  Newborn Delivery   Birth date/time:  12/06/2016 19:14:00 Delivery type:  Vaginal, Spontaneous Delivery      Home with mother.  Juliene Pina, CNM 12/07/2016, 5:31 PM

## 2016-12-07 NOTE — Progress Notes (Signed)
PPD # 1 SVD Information for the patient's newborn:  Alyssandra, Hulsebus [[id number]]  female      breast feeding   Baby name: Loree Fee  S:  Reports feeling well, desires early DC             Tolerating po/ No nausea or vomiting             Bleeding is decreased             Pain controlled with ibuprofen (OTC)             Up ad lib / ambulatory / voiding without difficulties        O:  A & O x 3, in no apparent distress              VS:  Vitals:   12/06/16 2050 12/06/16 2200 12/07/16 0210 12/07/16 0605  BP: 129/73 139/69 126/75 132/83  Pulse: 75 76 71 93  Resp: 20 18 18 18   Temp: 98.2 F (36.8 C) 98.9 F (37.2 C) 98.2 F (36.8 C) 97.6 F (36.4 C)  TempSrc: Oral Oral Oral Oral  SpO2:      Weight:      Height:        LABS:  Recent Labs  12/06/16 0904 12/07/16 0631  WBC 8.9 11.2*  HGB 10.3* 9.7*  HCT 32.2* 30.2*  PLT 176 155    Blood type: --/--/A POS (11/02 3893)  Rubella: Immune (04/18 0000)   I&O: I/O last 3 completed shifts: In: -  Out: 825 [Urine:500; Blood:325]          No intake/output data recorded.  Lungs: Clear and unlabored  Heart: regular rate and rhythm / no murmurs  Abdomen: soft, non-tender, non-distended             Fundus: firm, non-tender, U-2  Perineum: repair intact  Lochia: small  Extremities: trace edema, no calf pain or tenderness    A/P: PPD # 1 31 y.o., [id number]   Principal Problem:   SVD (spontaneous vaginal delivery) 11/2 Active Problems:   Gestational hypertension   Postpartum care following vaginal delivery    Second-degree perineal laceration, with delivery   Maternal anemia, with delivery  Oral fe and Mag ox  Doing well - stable status  Routine post partum orders  Anticipate discharge tonight if peds DC baby    Juliene Pina, MSN, CNM 12/07/2016, 8:55 AM

## 2016-12-07 NOTE — Progress Notes (Deleted)
CLINICAL SOCIAL WORK MATERNAL/CHILD NOTE  Patient Details  Name: Teresa Holden MRN: [id number] Date of Birth: 06/23/2001  Date:  12/07/2016  Clinical Social Worker Initiating Note:  Chanelle Ward, MSW, LCSW-A Date/Time: Initiated:  12/07/16/1300     Child's Name:  Teresa Holden   Biological Parents:  Mother   Need for Interpreter:  None   Reason for Referral:  New Mothers Age 31 and Under   Address:  [address] [place] [address]    Phone number:  [phone] (home)     Additional phone number: n/a  Household Members/Support Persons (HM/SP):   Household Member/Support Person 1   HM/SP Name Relationship DOB or Age  HM/SP -1 Teresa Holden MOB's mother; babys MGM  unknown   HM/SP -2        HM/SP -3        HM/SP -4        HM/SP -5        HM/SP -6        HM/SP -7        HM/SP -8          Natural Supports (not living in the home):  Spouse/significant other, Friends   Professional Supports: None   Employment: Student   Type of Work: Student    Education:  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   Homebound arranged:    Financial Resources:  Medicaid   Other Resources:  WIC   Cultural/Religious Considerations Which May Impact Care:  None reported   Strengths:  Ability to meet basic needs , Home prepared for child    Psychotropic Medications:         Pediatrician:       Pediatrician List:   [place]    [place]    [place]    [place]    [place]    [place]      Pediatrician Fax Number:    Risk Factors/Current Problems:  Other (Comment) (Teen Pregnancy )   Cognitive State:  Alert , Able to Concentrate , Insightful    Mood/Affect:  Calm , Comfortable , Interested    CSW Assessment: CSW met with MOB at bedside to complete assessment for consult for new mothers age 31 and under and for PNC gaps. Upon this writers arrival, MOB and baby were sitting in bed  bonding. This writer explained role and reasoning for visit. MOB was warm and welcoming. CSW congratulated MOB on baby's arrival. MOB was thankful. CSW assessed Mob's feelings during pregnancy, delivery, and now. MOB notes she had a good pregnancy overall with no complaints. MOB notes FOB is still living in [place] but plans to be involved as best he can. CSW inquired about gaps in PNC. MOB notes she moved to [place] from [place] and she did not have [place] Medicaid set up; thus, she could not get PNC. MOB informed this writer that she now has medicaid of [place] and baby will have the same, CSW informed MOB of the process to get baby enrolled. MOB noted understanding. CSW informed MOB of the drug screen taken of baby due to lapse in PNC. MOB noted understanding and noted no hx of illicit drug use. CSW assessed for supports. MOB noted she has great support from her mother. At this time, no other needs were addressed or requested. CSW has no barriers to discharge.   CSW Plan/Description:  CSW Will Continue to Monitor Umbilical Cord Tissue Drug Screen   Results and Make Report if Warranted, No Further Intervention Required/No Barriers to Discharge, Sudden Infant Death Syndrome (SIDS) Education, Perinatal Mood and Anxiety Disorder (PMADs) Education    Chanelle Ward, MSW, LCSW-A Clinical Social Worker  [facility]  Office: [phone]   

## 2016-12-13 DIAGNOSIS — N644 Mastodynia: Secondary | ICD-10-CM | POA: Diagnosis not present

## 2016-12-13 DIAGNOSIS — O135 Gestational [pregnancy-induced] hypertension without significant proteinuria, complicating the puerperium: Secondary | ICD-10-CM | POA: Diagnosis not present

## 2017-01-20 DIAGNOSIS — Z1151 Encounter for screening for human papillomavirus (HPV): Secondary | ICD-10-CM | POA: Diagnosis not present

## 2017-05-05 HISTORY — PX: INTRAUTERINE DEVICE (IUD) INSERTION: SHX5877

## 2017-05-27 ENCOUNTER — Ambulatory Visit: Payer: 59 | Admitting: Obstetrics and Gynecology

## 2017-05-29 ENCOUNTER — Other Ambulatory Visit: Payer: Self-pay

## 2017-05-29 ENCOUNTER — Encounter: Payer: Self-pay | Admitting: Obstetrics and Gynecology

## 2017-05-29 ENCOUNTER — Ambulatory Visit (INDEPENDENT_AMBULATORY_CARE_PROVIDER_SITE_OTHER): Payer: 59 | Admitting: Obstetrics and Gynecology

## 2017-05-29 VITALS — BP 122/62 | HR 76 | Resp 14 | Wt 170.0 lb

## 2017-05-29 DIAGNOSIS — Z01812 Encounter for preprocedural laboratory examination: Secondary | ICD-10-CM | POA: Diagnosis not present

## 2017-05-29 DIAGNOSIS — Z3009 Encounter for other general counseling and advice on contraception: Secondary | ICD-10-CM | POA: Diagnosis not present

## 2017-05-29 DIAGNOSIS — Z30430 Encounter for insertion of intrauterine contraceptive device: Secondary | ICD-10-CM

## 2017-05-29 LAB — POCT URINE PREGNANCY: Preg Test, Ur: NEGATIVE

## 2017-05-29 NOTE — Patient Instructions (Signed)
IUD Post-procedure Instructions . Cramping is common.  You may take Ibuprofen, Aleve, or Tylenol for the cramping.  This should resolve within 24 hours.   . You may have a small amount of spotting.  You should wear a mini pad for the next few days. . You may have intercourse in 24 hours. . You need to call the office if you have any pelvic pain, fever, heavy bleeding, or foul smelling vaginal discharge. . Shower or bathe as normal . Use a back up contraception for one week  

## 2017-05-29 NOTE — Progress Notes (Signed)
GYNECOLOGY  VISIT   HPI: 32 y.o.   Married  Caucasian  female   G2P1002 with Patient's last menstrual period was 05/22/2017.   here for IUD insertion. She isn't sure which IUD she wants. In the past she has had a mirena x 2 without problems. Her husband has c/o being poked in the past with the IUD.      GYNECOLOGIC HISTORY: Patient's last menstrual period was 05/22/2017. Contraception:none  Menopausal hormone therapy: none         OB History    Gravida  2   Para  2   Term  1   Preterm      AB      Living  2     SAB      TAB      Ectopic      Multiple  0   Live Births  2              Patient Active Problem List   Diagnosis Date Noted  . Postpartum care following vaginal delivery  12/07/2016  . SVD (spontaneous vaginal delivery) 11/2 12/07/2016  . Second-degree perineal laceration, with delivery 12/07/2016  . Maternal anemia, with delivery 12/07/2016  . Gestational hypertension 12/06/2016  . Pyelonephritis affecting pregnancy in second trimester 09/02/2016  . Pyelonephritis 09/02/2016  . Adult BMI > 30 10/24/2015  . Chronic migraine without aura without status migrainosus, not intractable 06/10/2015  . Sjogren's syndrome ([place])   . Migraines   . Anxiety     Past Medical History:  Diagnosis Date  . ADD (attention deficit disorder with hyperactivity)   . Adverse effect of unspecified general anesthetics, subsequent encounter    difficulty waking up from anesthesia  . Anxiety   . Family history of anesthesia complication    N&V  . Migraines   . PONV (postoperative nausea and vomiting)   . Sjogren's disease [facility])     Past Surgical History:  Procedure Laterality Date  . BREAST ENHANCEMENT SURGERY  2013   Dr. Lenon Curt  . CHOLECYSTECTOMY N/A 03/07/2016   Procedure: LAPAROSCOPIC CHOLECYSTECTOMY;  Surgeon: Coralie Keens, MD;  Location: [facility];  Service: General;  Laterality: N/A;  . HYSTEROSCOPY N/A 10/15/2013   Procedure: HYSTEROSCOPY  with removal of IUD;  Surgeon: Lyman Speller, MD;  Location: [facility];  Service: Gynecology;  Laterality: N/A;  . WISDOM TOOTH EXTRACTION Bilateral 2008    No current outpatient medications on file.   No current facility-administered medications for this visit.      ALLERGIES: Penicillins  Family History  Problem Relation Age of Onset  . Sarcoidosis Mother   . Heart disease Maternal Grandfather   . Diabetes Paternal Grandfather   . Heart disease Paternal Grandfather     Social History   Socioeconomic History  . Marital status: Married    Spouse name: Not on file  . Number of children: Not on file  . Years of education: Not on file  . Highest education level: Not on file  Occupational History  . Not on file  Social Needs  . Financial resource strain: Not on file  . Food insecurity:    Worry: Not on file    Inability: Not on file  . Transportation needs:    Medical: Not on file    Non-medical: Not on file  Tobacco Use  . Smoking status: Never Smoker  . Smokeless tobacco: Never Used  Substance and Sexual Activity  . Alcohol use: No  Alcohol/week: 0.0 oz  . Drug use: No  . Sexual activity: Yes    Partners: Male  Lifestyle  . Physical activity:    Days per week: Not on file    Minutes per session: Not on file  . Stress: Not on file  Relationships  . Social connections:    Talks on phone: Not on file    Gets together: Not on file    Attends religious service: Not on file    Active member of club or organization: Not on file    Attends meetings of clubs or organizations: Not on file    Relationship status: Not on file  . Intimate partner violence:    Fear of current or ex partner: Not on file    Emotionally abused: Not on file    Physically abused: Not on file    Forced sexual activity: Not on file  Other Topics Concern  . Not on file  Social History Narrative   Drinks maybe 1 soda daily.    Review of Systems  Constitutional: Negative.   HENT:  Negative.   Eyes: Negative.   Respiratory: Negative.   Cardiovascular: Negative.   Gastrointestinal: Negative.   Genitourinary: Negative.   Musculoskeletal: Negative.   Skin: Negative.   Neurological: Negative.   Endo/Heme/Allergies: Negative.   Psychiatric/Behavioral: Negative.     PHYSICAL EXAMINATION:    BP 122/62 (BP Location: Right Arm, Patient Position: Sitting, Cuff Size: Normal)   Pulse 76   Resp 14   Wt 170 lb (77.1 kg)   LMP 05/22/2017   Breastfeeding? No   BMI 30.11 kg/m     General appearance: alert, cooperative and appears stated age  Pelvic: External genitalia:  no lesions              Urethra:  normal appearing urethra with no masses, tenderness or lesions              Bartholins and Skenes: normal                 Vagina: normal appearing vagina with normal color and discharge, no lesions              Cervix: no lesions              Bimanual Exam:  Uterus:  normal size, contour, position, consistency, mobility, non-tender and anteverted              Adnexa: no mass, fullness, tenderness                The risks of the mirena IUD were reviewed with the patient, including infection, abnormal bleeding and uterine perfortion. Consent was signed.  A speculum was placed in the vagina, the cervix was cleansed with betadine. A tenaculum was placed on the cervix, the uterus sounded to 7-8 cm. No dilation was needed   The mirena IUD was inserted without difficulty. The string were cut to 3 cm. The tenaculum was removed. Slight oozing from the tenaculum site was stopped with pressure.   The patient tolerated the procedure well.   Chaperone was present for exam.  ASSESSMENT Contraception, desires IUD, reviewed options. Desires mirena   PLAN Mirena IUD placed F/U in one month   An After Visit Summary was printed and given to the patient.  CC: Dr Sabra Heck

## 2017-06-03 ENCOUNTER — Ambulatory Visit: Payer: 59 | Admitting: Obstetrics & Gynecology

## 2017-06-09 ENCOUNTER — Encounter: Payer: Self-pay | Admitting: Obstetrics & Gynecology

## 2017-06-09 ENCOUNTER — Ambulatory Visit (INDEPENDENT_AMBULATORY_CARE_PROVIDER_SITE_OTHER): Payer: 59 | Admitting: Obstetrics & Gynecology

## 2017-06-09 VITALS — BP 106/64 | HR 80 | Resp 16 | Ht 63.0 in | Wt 170.0 lb

## 2017-06-09 DIAGNOSIS — L723 Sebaceous cyst: Secondary | ICD-10-CM | POA: Diagnosis not present

## 2017-06-09 NOTE — Progress Notes (Signed)
GYNECOLOGY  VISIT  CC:   Left axillary lesion  HPI: 32 y.o. G84P1002 Married Caucasian female here for complaint of tender left axillary nodule that she noticed over the weekend.  Not really sure if anything in particular caused this.  Feels the tenderness with movement of her arm.  Does shave.  No skin redness.  No drainage.  No fever.    GYNECOLOGIC HISTORY: Patient's last menstrual period was 05/22/2017. Contraception: Mirena IUD placed 05/29/17  Menopausal hormone therapy: none  Patient Active Problem List   Diagnosis Date Noted  . Postpartum care following vaginal delivery  12/07/2016  . SVD (spontaneous vaginal delivery) 11/2 12/07/2016  . Second-degree perineal laceration, with delivery 12/07/2016  . Maternal anemia, with delivery 12/07/2016  . Gestational hypertension 12/06/2016  . Pyelonephritis affecting pregnancy in second trimester 09/02/2016  . Pyelonephritis 09/02/2016  . Adult BMI > 30 10/24/2015  . Chronic migraine without aura without status migrainosus, not intractable 06/10/2015  . Sjogren's syndrome ([place])   . Migraines   . Anxiety     Past Medical History:  Diagnosis Date  . ADD (attention deficit disorder with hyperactivity)   . Adverse effect of unspecified general anesthetics, subsequent encounter    difficulty waking up from anesthesia  . Anxiety   . Family history of anesthesia complication    N&V  . Migraines   . PONV (postoperative nausea and vomiting)   . Sjogren's disease [facility])     Past Surgical History:  Procedure Laterality Date  . BREAST ENHANCEMENT SURGERY  2013   Dr. Lenon Curt  . CHOLECYSTECTOMY N/A 03/07/2016   Procedure: LAPAROSCOPIC CHOLECYSTECTOMY;  Surgeon: Coralie Keens, MD;  Location: [facility];  Service: General;  Laterality: N/A;  . HYSTEROSCOPY N/A 10/15/2013   Procedure: HYSTEROSCOPY with removal of IUD;  Surgeon: Lyman Speller, MD;  Location: [facility];  Service: Gynecology;  Laterality: N/A;  . WISDOM TOOTH  EXTRACTION Bilateral 2008    MEDS:   No current outpatient medications on file prior to visit.   No current facility-administered medications on file prior to visit.     ALLERGIES: Penicillins  Family History  Problem Relation Age of Onset  . Sarcoidosis Mother   . Heart disease Maternal Grandfather   . Diabetes Paternal Grandfather   . Heart disease Paternal Grandfather     SH:  Married, non smoker  Review of Systems  Constitutional: Negative.   Endo/Heme/Allergies: Negative.     PHYSICAL EXAMINATION:    BP 106/64 (BP Location: Right Arm, Patient Position: Sitting, Cuff Size: Large)   Pulse 80   Resp 16   Ht 5\' 3"  (1.6 m)   Wt 170 lb (77.1 kg)   LMP 05/22/2017   BMI 30.11 kg/m     Physical Exam  Constitutional: She appears well-developed and well-nourished.  Respiratory: Left breast exhibits no inverted nipple, no mass, no nipple discharge, no skin change and no tenderness.    Lymphadenopathy:       Right axillary: No lateral adenopathy present.       Left axillary: No lateral adenopathy present.      Right: No supraclavicular adenopathy present.       Left: No supraclavicular adenopathy present.  Skin: Skin is warm.  Psychiatric: She has a normal mood and affect.    Assessment: Sebaceous cyst in left axilla  Plan: Warm compresses and heat recommended.  If not resolved in 5-7 days, will recheck.  She will let me know if this becomes more erythematous.

## 2017-06-10 ENCOUNTER — Ambulatory Visit: Payer: 59 | Admitting: Obstetrics & Gynecology

## 2017-07-03 ENCOUNTER — Ambulatory Visit (INDEPENDENT_AMBULATORY_CARE_PROVIDER_SITE_OTHER): Payer: 59 | Admitting: Obstetrics and Gynecology

## 2017-07-03 ENCOUNTER — Encounter: Payer: Self-pay | Admitting: Obstetrics and Gynecology

## 2017-07-03 ENCOUNTER — Other Ambulatory Visit: Payer: Self-pay

## 2017-07-03 VITALS — BP 102/60 | HR 64 | Resp 14 | Wt 170.0 lb

## 2017-07-03 DIAGNOSIS — Z30431 Encounter for routine checking of intrauterine contraceptive device: Secondary | ICD-10-CM

## 2017-07-03 NOTE — Progress Notes (Signed)
GYNECOLOGY  VISIT   HPI: 32 y.o.   Married  Caucasian  female   G2P1002 with Patient's last menstrual period was 06/05/2017.   here for IUD check. Spotted for 3 weeks. Minimal cramping. No pain with sex, some spotting after sex.    GYNECOLOGIC HISTORY: Patient's last menstrual period was 06/05/2017. Contraception:IUD Menopausal hormone therapy: none         OB History    Gravida  2   Para  2   Term  1   Preterm      AB      Living  2     SAB      TAB      Ectopic      Multiple  0   Live Births  2              Patient Active Problem List   Diagnosis Date Noted  . Gestational hypertension 12/06/2016  . 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 Pyelonephritis 09/02/2016  . Sjogren's syndrome ([place]) 08/29/2016  . Adult BMI > 30 10/24/2015  . Chronic migraine without aura without status migrainosus, not intractable 06/10/2015  . Migraines   . Anxiety     Past Medical History:  Diagnosis Date  . ADD (attention deficit disorder with hyperactivity)   . Adverse effect of unspecified general anesthetics, subsequent encounter    difficulty waking up from anesthesia  . Anxiety   . Family history of anesthesia complication    N&V  . Migraines   . PONV (postoperative nausea and vomiting)   . Sjogren's disease [facility])     Past Surgical History:  Procedure Laterality Date  . BREAST ENHANCEMENT SURGERY  2013   Dr. Lenon Curt  . CHOLECYSTECTOMY N/A 03/07/2016   Procedure: LAPAROSCOPIC CHOLECYSTECTOMY;  Surgeon: Coralie Keens, MD;  Location: [facility];  Service: General;  Laterality: N/A;  . HYSTEROSCOPY N/A 10/15/2013   Procedure: HYSTEROSCOPY with removal of IUD;  Surgeon: Lyman Speller, MD;  Location: [facility];  Service: Gynecology;  Laterality: N/A;  . INTRAUTERINE DEVICE (IUD) INSERTION  05/2017   Mirena   . WISDOM TOOTH EXTRACTION Bilateral 2008    Current Outpatient Medications  Medication Sig Dispense Refill  . levonorgestrel (MIRENA) 20  MCG/24HR IUD 1 each by Intrauterine route once.     No current facility-administered medications for this visit.      ALLERGIES: Penicillins  Family History  Problem Relation Age of Onset  . Sarcoidosis Mother   . Heart disease Maternal Grandfather   . Diabetes Paternal Grandfather   . Heart disease Paternal Grandfather     Social History   Socioeconomic History  . Marital status: Married    Spouse name: Not on file  . Number of children: Not on file  . Years of education: Not on file  . Highest education level: Not on file  Occupational History  . Not on file  Social Needs  . Financial resource strain: Not on file  . Food insecurity:    Worry: Not on file    Inability: Not on file  . Transportation needs:    Medical: Not on file    Non-medical: Not on file  Tobacco Use  . Smoking status: Never Smoker  . Smokeless tobacco: Never Used  Substance and Sexual Activity  . Alcohol use: No    Alcohol/week: 0.0 oz  . Drug use: No  . Sexual activity: Yes    Partners: Male    Birth control/protection: IUD  Comment: Mirena placed 05/29/17  Lifestyle  . Physical activity:    Days per week: Not on file    Minutes per session: Not on file  . Stress: Not on file  Relationships  . Social connections:    Talks on phone: Not on file    Gets together: Not on file    Attends religious service: Not on file    Active member of club or organization: Not on file    Attends meetings of clubs or organizations: Not on file    Relationship status: Not on file  . Intimate partner violence:    Fear of current or ex partner: Not on file    Emotionally abused: Not on file    Physically abused: Not on file    Forced sexual activity: Not on file  Other Topics Concern  . Not on file  Social History Narrative   Drinks maybe 1 soda daily.    Review of Systems  Constitutional: Negative.   HENT: Negative.   Eyes: Negative.   Respiratory: Negative.   Cardiovascular: Negative.    Gastrointestinal: Negative.   Genitourinary: Negative.   Musculoskeletal: Negative.   Skin: Negative.   Neurological: Negative.   Endo/Heme/Allergies: Negative.   Psychiatric/Behavioral: Negative.     PHYSICAL EXAMINATION:    BP 102/60 (BP Location: Right Arm, Patient Position: Sitting, Cuff Size: Normal)   Pulse 64   Resp 14   Wt 170 lb (77.1 kg)   LMP 06/05/2017   BMI 30.11 kg/m     General appearance: alert, cooperative and appears stated age   Pelvic: External genitalia:  no lesions              Urethra:  normal appearing urethra with no masses, tenderness or lesions              Bartholins and Skenes: normal                 Vagina: normal appearing vagina with normal color and discharge, no lesions              Cervix: no lesions and IUD string not seen, with cytobrush able to pull the tip of the IUD string to the external os              Bimanual Exam:  Uterus:  normal size, contour, position, consistency, mobility, non-tender              Adnexa: no mass, fullness, tenderness                Chaperone was present for exam.  ASSESSMENT IUD check, overall doing well. IUD string retraced.    PLAN Will do an ultrasound to confirm location.   An After Visit Summary was printed and given to the patient.  Addendum: IUD in place on ultrasound.

## 2017-07-09 DIAGNOSIS — E038 Other specified hypothyroidism: Secondary | ICD-10-CM | POA: Diagnosis not present

## 2017-07-09 DIAGNOSIS — Z0000 Encounter for general adult medical examination without abnormal findings: Secondary | ICD-10-CM | POA: Diagnosis not present

## 2017-07-09 DIAGNOSIS — E559 Vitamin D deficiency, unspecified: Secondary | ICD-10-CM | POA: Diagnosis not present

## 2017-07-09 DIAGNOSIS — E119 Type 2 diabetes mellitus without complications: Secondary | ICD-10-CM | POA: Diagnosis not present

## 2017-07-09 DIAGNOSIS — G43109 Migraine with aura, not intractable, without status migrainosus: Secondary | ICD-10-CM | POA: Diagnosis not present

## 2017-07-09 DIAGNOSIS — I10 Essential (primary) hypertension: Secondary | ICD-10-CM | POA: Diagnosis not present

## 2017-07-09 DIAGNOSIS — D518 Other vitamin B12 deficiency anemias: Secondary | ICD-10-CM | POA: Diagnosis not present

## 2017-07-09 DIAGNOSIS — R41840 Attention and concentration deficit: Secondary | ICD-10-CM | POA: Diagnosis not present

## 2017-07-09 DIAGNOSIS — E782 Mixed hyperlipidemia: Secondary | ICD-10-CM | POA: Diagnosis not present

## 2017-08-01 ENCOUNTER — Ambulatory Visit (INDEPENDENT_AMBULATORY_CARE_PROVIDER_SITE_OTHER): Payer: 59 | Admitting: Obstetrics & Gynecology

## 2017-08-01 VITALS — BP 110/86 | HR 72 | Temp 98.2°F | Wt 173.0 lb

## 2017-08-01 DIAGNOSIS — N898 Other specified noninflammatory disorders of vagina: Secondary | ICD-10-CM | POA: Diagnosis not present

## 2017-08-01 MED ORDER — TINIDAZOLE 500 MG PO TABS: 1.0000 g | ORAL_TABLET | Freq: Every day | ORAL | 0 refills | Status: DC

## 2017-08-01 MED FILL — TINIDAZOLE 500 MG TABLET: 500 | 5 days supply | Qty: 10 | Fill #0

## 2017-08-01 NOTE — Progress Notes (Signed)
GYNECOLOGY  VISIT  CC:   Abnormal odor  HPI: 32 y.o. G73P1002 Married Caucasian female here for complaint vaginal odor and discharge for 3 days.  Had IUD placed 05/29/17.  Had spotting up until a week ago.  This has finally stopped.  No pain with intercourse.  Has not noted a lot of odor.  Denies urinary symptoms or pelvic pain.  GYNECOLOGIC HISTORY: No LMP recorded. (Menstrual status: IUD). Contraception: IUD  Patient Active Problem List   Diagnosis Date Noted  . Gestational hypertension 12/06/2016  . 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 Pyelonephritis 09/02/2016  . Sjogren's syndrome ([place]) 08/29/2016  . Adult BMI > 30 10/24/2015  . Chronic migraine without aura without status migrainosus, not intractable 06/10/2015  . Migraines   . Anxiety     Past Medical History:  Diagnosis Date  . ADD (attention deficit disorder with hyperactivity)   . Adverse effect of unspecified general anesthetics, subsequent encounter    difficulty waking up from anesthesia  . Anxiety   . Family history of anesthesia complication    N&V  . Migraines   . PONV (postoperative nausea and vomiting)   . Sjogren's disease [facility])     Past Surgical History:  Procedure Laterality Date  . BREAST ENHANCEMENT SURGERY  2013   Dr. Lenon Curt  . CHOLECYSTECTOMY N/A 03/07/2016   Procedure: LAPAROSCOPIC CHOLECYSTECTOMY;  Surgeon: Coralie Keens, MD;  Location: [facility];  Service: General;  Laterality: N/A;  . HYSTEROSCOPY N/A 10/15/2013   Procedure: HYSTEROSCOPY with removal of IUD;  Surgeon: Lyman Speller, MD;  Location: [facility];  Service: Gynecology;  Laterality: N/A;  . INTRAUTERINE DEVICE (IUD) INSERTION  05/2017   Mirena   . WISDOM TOOTH EXTRACTION Bilateral 2008    MEDS:   Current Outpatient Medications on File Prior to Visit  Medication Sig Dispense Refill  . levonorgestrel (MIRENA) 20 MCG/24HR IUD 1 each by Intrauterine route once.     No current facility-administered medications on  file prior to visit.     ALLERGIES: Penicillins  Family History  Problem Relation Age of Onset  . Sarcoidosis Mother   . Heart disease Maternal Grandfather   . Diabetes Paternal Grandfather   . Heart disease Paternal Grandfather     SH:  Married, non smoker  Review of Systems  Constitutional: Negative.   Gastrointestinal: Negative.   Genitourinary: Negative for dysuria, frequency, hematuria and urgency.       Vaginal odor    PHYSICAL EXAMINATION:    There were no vitals taken for this visit.    General appearance: alert, cooperative and appears stated age Lymph:  No inguinal LAD noted  Pelvic: External genitalia:  no lesions              Urethra:  normal appearing urethra with no masses, tenderness or lesions              Bartholins and Skenes: normal                 Vagina: normal appearing vagina with watery discharge noted, no blood or lesions noted              Cervix: no lesions and IUD string noted  Assessment: Vaginal odor  Plan: Tindamax 1gm daily x 5 days.  Affirm pending.

## 2017-08-02 ENCOUNTER — Encounter: Payer: Self-pay | Admitting: Obstetrics & Gynecology

## 2017-08-02 LAB — VAGINITIS/VAGINOSIS, DNA PROBE
Candida Species: NEGATIVE
Gardnerella vaginalis: NEGATIVE
Trichomonas vaginosis: NEGATIVE

## 2017-08-19 DIAGNOSIS — R140 Abdominal distension (gaseous): Secondary | ICD-10-CM | POA: Diagnosis not present

## 2017-08-19 DIAGNOSIS — R197 Diarrhea, unspecified: Secondary | ICD-10-CM | POA: Diagnosis not present

## 2017-08-20 MED FILL — CHOLESTYRAMINE POWDER: 4 | 47 days supply | Qty: 378 | Fill #0

## 2017-08-27 DIAGNOSIS — E119 Type 2 diabetes mellitus without complications: Secondary | ICD-10-CM | POA: Diagnosis not present

## 2017-08-27 DIAGNOSIS — E782 Mixed hyperlipidemia: Secondary | ICD-10-CM | POA: Diagnosis not present

## 2017-08-27 DIAGNOSIS — E038 Other specified hypothyroidism: Secondary | ICD-10-CM | POA: Diagnosis not present

## 2017-08-27 DIAGNOSIS — G43109 Migraine with aura, not intractable, without status migrainosus: Secondary | ICD-10-CM | POA: Diagnosis not present

## 2017-08-27 DIAGNOSIS — E559 Vitamin D deficiency, unspecified: Secondary | ICD-10-CM | POA: Diagnosis not present

## 2017-08-27 DIAGNOSIS — R41840 Attention and concentration deficit: Secondary | ICD-10-CM | POA: Diagnosis not present

## 2017-08-27 DIAGNOSIS — D518 Other vitamin B12 deficiency anemias: Secondary | ICD-10-CM | POA: Diagnosis not present

## 2017-08-29 ENCOUNTER — Encounter: Payer: Self-pay | Admitting: Obstetrics and Gynecology

## 2017-08-29 ENCOUNTER — Ambulatory Visit (INDEPENDENT_AMBULATORY_CARE_PROVIDER_SITE_OTHER): Payer: 59 | Admitting: Obstetrics and Gynecology

## 2017-08-29 ENCOUNTER — Other Ambulatory Visit: Payer: Self-pay

## 2017-08-29 VITALS — BP 116/82 | HR 68 | Wt 170.6 lb

## 2017-08-29 DIAGNOSIS — N760 Acute vaginitis: Secondary | ICD-10-CM

## 2017-08-29 DIAGNOSIS — B9689 Other specified bacterial agents as the cause of diseases classified elsewhere: Secondary | ICD-10-CM

## 2017-08-29 MED ORDER — METRONIDAZOLE 500 MG PO TABS: 500.0000 mg | ORAL_TABLET | Freq: Two times a day (BID) | ORAL | 0 refills | Status: DC

## 2017-08-29 MED FILL — metroNIDAZOLE 500 MG TABS: 500 | 7 days supply | Qty: 14 | Fill #0

## 2017-08-29 NOTE — Progress Notes (Signed)
GYNECOLOGY  VISIT   HPI: 32 y.o.   Married  Caucasian  female   G2P1002 with No LMP recorded. (Menstrual status: IUD).   here for a bad vaginal odor since IUD placement at the end of April. She c/o an increase in mucous vaginal discharge as well. No itching, burning or irritaiton  GYNECOLOGIC HISTORY: No LMP recorded. (Menstrual status: IUD). Contraception: Mirena IUD Menopausal hormone therapy: None        OB History    Gravida  2   Para  2   Term  1   Preterm  0   AB  0   Living  2     SAB  0   TAB  0   Ectopic  0   Multiple  0   Live Births  2              Patient Active Problem List   Diagnosis Date Noted  . Gestational hypertension 12/06/2016  . 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 Pyelonephritis 09/02/2016  . Sjogren's syndrome ([place]) 08/29/2016  . Adult BMI > 30 10/24/2015  . Chronic migraine without aura without status migrainosus, not intractable 06/10/2015  . Anxiety     Past Medical History:  Diagnosis Date  . ADD (attention deficit disorder with hyperactivity)   . Adverse effect of unspecified general anesthetics, subsequent encounter    difficulty waking up from anesthesia  . Anxiety   . Family history of anesthesia complication    N&V  . Migraines   . PONV (postoperative nausea and vomiting)   . Sjogren's disease [facility])     Past Surgical History:  Procedure Laterality Date  . BREAST ENHANCEMENT SURGERY  2013   Dr. Lenon Curt  . CHOLECYSTECTOMY N/A 03/07/2016   Procedure: LAPAROSCOPIC CHOLECYSTECTOMY;  Surgeon: Coralie Keens, MD;  Location: [place];  Service: General;  Laterality: N/A;  . HYSTEROSCOPY N/A 10/15/2013   Procedure: HYSTEROSCOPY with removal of IUD;  Surgeon: Lyman Speller, MD;  Location: [facility];  Service: Gynecology;  Laterality: N/A;  . INTRAUTERINE DEVICE (IUD) INSERTION  05/2017   Mirena   . WISDOM TOOTH EXTRACTION Bilateral 2008    Current Outpatient Medications  Medication Sig Dispense  Refill  . amphetamine-dextroamphetamine (ADDERALL) 10 MG tablet Take 10 mg by mouth daily with breakfast.    . cholestyramine (QUESTRAN) 4 GM/DOSE powder Take 4 g by mouth 2 (two) times daily.  12  . Lactobacillus-Inulin (PROBIOTIC DIGESTIVE SUPPORT PO) Take by mouth.    . levonorgestrel (MIRENA) 20 MCG/24HR IUD 1 each by Intrauterine route once.     No current facility-administered medications for this visit.      ALLERGIES: Penicillins  Family History  Problem Relation Age of Onset  . Sarcoidosis Mother   . Heart disease Maternal Grandfather   . Diabetes Paternal Grandfather   . Heart disease Paternal Grandfather     Social History   Socioeconomic History  . Marital status: Married    Spouse name: Not on file  . Number of children: Not on file  . Years of education: Not on file  . Highest education level: Not on file  Occupational History  . Not on file  Social Needs  . Financial resource strain: Not on file  . Food insecurity:    Worry: Not on file    Inability: Not on file  . Transportation needs:    Medical: Not on file    Non-medical: Not on file  Tobacco Use  .  Smoking status: Never Smoker  . Smokeless tobacco: Never Used  Substance and Sexual Activity  . Alcohol use: No    Alcohol/week: 0.0 oz  . Drug use: No  . Sexual activity: Yes    Partners: Male    Birth control/protection: IUD    Comment: Mirena placed 05/29/17  Lifestyle  . Physical activity:    Days per week: Not on file    Minutes per session: Not on file  . Stress: Not on file  Relationships  . Social connections:    Talks on phone: Not on file    Gets together: Not on file    Attends religious service: Not on file    Active member of club or organization: Not on file    Attends meetings of clubs or organizations: Not on file    Relationship status: Not on file  . Intimate partner violence:    Fear of current or ex partner: Not on file    Emotionally abused: Not on file    Physically  abused: Not on file    Forced sexual activity: Not on file  Other Topics Concern  . Not on file  Social History Narrative   Drinks maybe 1 soda daily.    Review of Systems  Constitutional: Negative.   HENT: Negative.   Eyes: Negative.   Respiratory: Negative.   Cardiovascular: Negative.   Gastrointestinal: Negative.   Genitourinary:       Vaginal odor  Musculoskeletal: Negative.   Skin: Negative.   Neurological: Negative.   Endo/Heme/Allergies: Negative.   Psychiatric/Behavioral: Negative.     PHYSICAL EXAMINATION:    BP 116/82 (BP Location: Right Arm, Patient Position: Sitting)   Pulse 68   Wt 170 lb 9.6 oz (77.4 kg)   BMI 30.22 kg/m     General appearance: alert, cooperative and appears stated age  Pelvic: External genitalia:  no lesions              Urethra:  normal appearing urethra with no masses, tenderness or lesions              Bartholins and Skenes: normal                 Vagina: normal appearing vagina with a slight increase in watery, white vaginal d/c              Cervix: slight erythema, IUD string not seen (previously in place on ultrasound)  Wet prep: + clue, no trich, + wbc KOH: no yeast PH: 5.5   ASSESSMENT Bacterial vaginitis    PLAN Treat with Flagyl Call if not improving   An After Visit Summary was printed and given to the patient.

## 2017-08-29 NOTE — Patient Instructions (Signed)
Bacterial Vaginosis Bacterial vaginosis is a vaginal infection that occurs when the normal balance of bacteria in the vagina is disrupted. It results from an overgrowth of certain bacteria. This is the most common vaginal infection among women ages 15-44. Because bacterial vaginosis increases your risk for STIs (sexually transmitted infections), getting treated can help reduce your risk for chlamydia, gonorrhea, herpes, and HIV (human immunodeficiency virus). Treatment is also important for preventing complications in pregnant women, because this condition can cause an early (premature) delivery. What are the causes? This condition is caused by an increase in harmful bacteria that are normally present in small amounts in the vagina. However, the reason that the condition develops is not fully understood. What increases the risk? The following factors may make you more likely to develop this condition:  Having a new sexual partner or multiple sexual partners.  Having unprotected sex.  Douching.  Having an intrauterine device (IUD).  Smoking.  Drug and alcohol abuse.  Taking certain antibiotic medicines.  Being pregnant.  You cannot get bacterial vaginosis from toilet seats, bedding, swimming pools, or contact with objects around you. What are the signs or symptoms? Symptoms of this condition include:  Grey or white vaginal discharge. The discharge can also be watery or foamy.  A fish-like odor with discharge, especially after sexual intercourse or during menstruation.  Itching in and around the vagina.  Burning or pain with urination.  Some women with bacterial vaginosis have no signs or symptoms. How is this diagnosed? This condition is diagnosed based on:  Your medical history.  A physical exam of the vagina.  Testing a sample of vaginal fluid under a microscope to look for a large amount of bad bacteria or abnormal cells. Your health care provider may use a cotton swab  or a small wooden spatula to collect the sample.  How is this treated? This condition is treated with antibiotics. These may be given as a pill, a vaginal cream, or a medicine that is put into the vagina (suppository). If the condition comes back after treatment, a second round of antibiotics may be needed. Follow these instructions at home: Medicines  Take over-the-counter and prescription medicines only as told by your health care provider.  Take or use your antibiotic as told by your health care provider. Do not stop taking or using the antibiotic even if you start to feel better. General instructions  If you have a female sexual partner, tell her that you have a vaginal infection. She should see her health care provider and be treated if she has symptoms. If you have a female sexual partner, he does not need treatment.  During treatment: ? Avoid sexual activity until you finish treatment. ? Do not douche. ? Avoid alcohol as directed by your health care provider. ? Avoid breastfeeding as directed by your health care provider.  Drink enough water and fluids to keep your urine clear or pale yellow.  Keep the area around your vagina and rectum clean. ? Wash the area daily with warm water. ? Wipe yourself from front to back after using the toilet.  Keep all follow-up visits as told by your health care provider. This is important. How is this prevented?  Do not douche.  Wash the outside of your vagina with warm water only.  Use protection when having sex. This includes latex condoms and dental dams.  Limit how many sexual partners you have. To help prevent bacterial vaginosis, it is best to have sex with just   one partner (monogamous).  Make sure you and your sexual partner are tested for STIs.  Wear cotton or cotton-lined underwear.  Avoid wearing tight pants and pantyhose, especially during summer.  Limit the amount of alcohol that you drink.  Do not use any products that  contain nicotine or tobacco, such as cigarettes and e-cigarettes. If you need help quitting, ask your health care provider.  Do not use illegal drugs. Where to find more information:  Centers for Disease Control and Prevention: www.cdc.gov/std  American Sexual Health Association (ASHA): www.ashastd.org  U.S. Department of Health and Human Services, Office on Women's Health: www.womenshealth.gov/ or https://www.womenshealth.gov/a-z-topics/bacterial-vaginosis Contact a health care provider if:  Your symptoms do not improve, even after treatment.  You have more discharge or pain when urinating.  You have a fever.  You have pain in your abdomen.  You have pain during sex.  You have vaginal bleeding between periods. Summary  Bacterial vaginosis is a vaginal infection that occurs when the normal balance of bacteria in the vagina is disrupted.  Because bacterial vaginosis increases your risk for STIs (sexually transmitted infections), getting treated can help reduce your risk for chlamydia, gonorrhea, herpes, and HIV (human immunodeficiency virus). Treatment is also important for preventing complications in pregnant women, because the condition can cause an early (premature) delivery.  This condition is treated with antibiotic medicines. These may be given as a pill, a vaginal cream, or a medicine that is put into the vagina (suppository). This information is not intended to replace advice given to you by your health care provider. Make sure you discuss any questions you have with your health care provider. Document Released: 01/21/2005 Document Revised: 05/27/2016 Document Reviewed: 10/07/2015 Elsevier Interactive Patient Education  2018 Elsevier Inc.  

## 2017-09-11 ENCOUNTER — Ambulatory Visit (INDEPENDENT_AMBULATORY_CARE_PROVIDER_SITE_OTHER): Payer: 59 | Admitting: Obstetrics and Gynecology

## 2017-09-11 ENCOUNTER — Encounter: Payer: Self-pay | Admitting: Obstetrics and Gynecology

## 2017-09-11 ENCOUNTER — Other Ambulatory Visit: Payer: Self-pay

## 2017-09-11 VITALS — BP 108/68 | HR 80 | Wt 169.0 lb

## 2017-09-11 DIAGNOSIS — N898 Other specified noninflammatory disorders of vagina: Secondary | ICD-10-CM | POA: Diagnosis not present

## 2017-09-11 NOTE — Progress Notes (Signed)
GYNECOLOGY  VISIT   HPI: 32 y.o.   Married  Caucasian  female   G2P1002 with No LMP recorded. (Menstrual status: IUD).   here for vaginal odor and discharge. The discharge is yellow, watery. The odor has been going on for months, since IUD was inserted. No help with recent course of flagyl (also got her cycle while on the flagyl).   GYNECOLOGIC HISTORY: No LMP recorded. (Menstrual status: IUD). Contraception: IUD Menopausal hormone therapy: None        OB History    Gravida  2   Para  2   Term  1   Preterm  0   AB  0   Living  2     SAB  0   TAB  0   Ectopic  0   Multiple  0   Live Births  2              Patient Active Problem List   Diagnosis Date Noted  . Gestational hypertension 12/06/2016  . 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 Pyelonephritis 09/02/2016  . Sjogren's syndrome ([place]) 08/29/2016  . Adult BMI > 30 10/24/2015  . Chronic migraine without aura without status migrainosus, not intractable 06/10/2015  . Anxiety     Past Medical History:  Diagnosis Date  . ADD (attention deficit disorder with hyperactivity)   . Adverse effect of unspecified general anesthetics, subsequent encounter    difficulty waking up from anesthesia  . Anxiety   . Family history of anesthesia complication    N&V  . Migraines   . PONV (postoperative nausea and vomiting)   . Sjogren's disease [facility])     Past Surgical History:  Procedure Laterality Date  . BREAST ENHANCEMENT SURGERY  2013   Dr. Lenon Curt  . CHOLECYSTECTOMY N/A 03/07/2016   Procedure: LAPAROSCOPIC CHOLECYSTECTOMY;  Surgeon: Coralie Keens, MD;  Location: [place];  Service: General;  Laterality: N/A;  . HYSTEROSCOPY N/A 10/15/2013   Procedure: HYSTEROSCOPY with removal of IUD;  Surgeon: Lyman Speller, MD;  Location: [facility];  Service: Gynecology;  Laterality: N/A;  . INTRAUTERINE DEVICE (IUD) INSERTION  05/2017   Mirena   . WISDOM TOOTH EXTRACTION Bilateral 2008    Current  Outpatient Medications  Medication Sig Dispense Refill  . amphetamine-dextroamphetamine (ADDERALL) 10 MG tablet Take 10 mg by mouth daily with breakfast.    . cholestyramine (QUESTRAN) 4 GM/DOSE powder Take 4 g by mouth 2 (two) times daily.  12  . Lactobacillus-Inulin (PROBIOTIC DIGESTIVE SUPPORT PO) Take by mouth.    . levonorgestrel (MIRENA) 20 MCG/24HR IUD 1 each by Intrauterine route once.     No current facility-administered medications for this visit.      ALLERGIES: Penicillins  Family History  Problem Relation Age of Onset  . Sarcoidosis Mother   . Heart disease Maternal Grandfather   . Diabetes Paternal Grandfather   . Heart disease Paternal Grandfather     Social History   Socioeconomic History  . Marital status: Married    Spouse name: Not on file  . Number of children: Not on file  . Years of education: Not on file  . Highest education level: Not on file  Occupational History  . Not on file  Social Needs  . Financial resource strain: Not on file  . Food insecurity:    Worry: Not on file    Inability: Not on file  . Transportation needs:    Medical: Not on file  Non-medical: Not on file  Tobacco Use  . Smoking status: Never Smoker  . Smokeless tobacco: Never Used  Substance and Sexual Activity  . Alcohol use: No    Alcohol/week: 0.0 standard drinks  . Drug use: No  . Sexual activity: Yes    Partners: Male    Birth control/protection: IUD    Comment: Mirena placed 05/29/17  Lifestyle  . Physical activity:    Days per week: Not on file    Minutes per session: Not on file  . Stress: Not on file  Relationships  . Social connections:    Talks on phone: Not on file    Gets together: Not on file    Attends religious service: Not on file    Active member of club or organization: Not on file    Attends meetings of clubs or organizations: Not on file    Relationship status: Not on file  . Intimate partner violence:    Fear of current or ex partner: Not  on file    Emotionally abused: Not on file    Physically abused: Not on file    Forced sexual activity: Not on file  Other Topics Concern  . Not on file  Social History Narrative   Drinks maybe 1 soda daily.    Review of Systems  Constitutional: Negative.   HENT: Negative.   Eyes: Negative.   Respiratory: Negative.   Cardiovascular: Negative.   Gastrointestinal: Negative.   Genitourinary:       Vaginal odor and discharge  Skin: Negative.   Neurological: Negative.   Psychiatric/Behavioral: Negative.     PHYSICAL EXAMINATION:    BP 108/68 (BP Location: Right Arm, Patient Position: Sitting)   Pulse 80   Wt 169 lb (76.7 kg)   BMI 29.94 kg/m     General appearance: alert, cooperative and appears stated age  Pelvic: External genitalia:  no lesions              Urethra:  normal appearing urethra with no masses, tenderness or lesions              Bartholins and Skenes: normal                 Vagina: normal appearing vagina with a slight increase in clear, watery vaginal d/c              Cervix: no cervical motion tenderness and no lesions              Bimanual Exam:  Uterus:  normal size, contour, position, consistency, mobility, non-tender              Adnexa: no mass, fullness, tenderness              Chaperone was present for exam.  Wet prep: ? clue, no trich, few wbc KOH: no yeast PH: 5.5   ASSESSMENT Vaginal d/c and odor, treated for BV, symptoms persist. Suspect she still has BV    PLAN Send nuswab for GC/CT/BV/Trich and yeast Await results If testing is negative, discussed the [employer] Douching device discussed in up to date and doing a tap water douche daily for one month (would not do this unless her nuswab is negative)   An After Visit Summary was printed and given to the patient.

## 2017-09-16 ENCOUNTER — Other Ambulatory Visit: Payer: Self-pay | Admitting: Obstetrics and Gynecology

## 2017-09-16 MED ORDER — METRONIDAZOLE 0.75 % VA GEL: 1.0000 | Freq: Every day | VAGINAL | 0 refills | Status: DC

## 2017-09-17 LAB — NUSWAB VAGINITIS PLUS (VG+)
Candida albicans, NAA: NEGATIVE
Candida glabrata, NAA: NEGATIVE
Chlamydia trachomatis, NAA: NEGATIVE
Neisseria gonorrhoeae, NAA: NEGATIVE
Trich vag by NAA: NEGATIVE

## 2017-09-19 ENCOUNTER — Other Ambulatory Visit: Payer: Self-pay

## 2017-09-19 ENCOUNTER — Ambulatory Visit (INDEPENDENT_AMBULATORY_CARE_PROVIDER_SITE_OTHER): Payer: 59 | Admitting: Obstetrics and Gynecology

## 2017-09-19 ENCOUNTER — Other Ambulatory Visit: Payer: Self-pay | Admitting: *Deleted

## 2017-09-19 ENCOUNTER — Telehealth: Payer: Self-pay | Admitting: *Deleted

## 2017-09-19 ENCOUNTER — Encounter: Payer: Self-pay | Admitting: Obstetrics and Gynecology

## 2017-09-19 VITALS — BP 100/62 | HR 68 | Wt 169.0 lb

## 2017-09-19 DIAGNOSIS — B373 Candidiasis of vulva and vagina: Secondary | ICD-10-CM | POA: Diagnosis not present

## 2017-09-19 DIAGNOSIS — B3731 Acute candidiasis of vulva and vagina: Secondary | ICD-10-CM

## 2017-09-19 MED ORDER — FLUCONAZOLE 150 MG PO TABS: 150.0000 mg | ORAL_TABLET | Freq: Once | ORAL | 0 refills | Status: DC

## 2017-09-19 MED ORDER — BETAMETHASONE VALERATE 0.1 % EX OINT: TOPICAL_OINTMENT | CUTANEOUS | 0 refills | Status: DC

## 2017-09-19 MED ORDER — FLUCONAZOLE 150 MG PO TABS: 150.0000 mg | ORAL_TABLET | Freq: Once | ORAL | 0 refills | Status: AC

## 2017-09-19 MED FILL — FLUCONAZOLE 150 MG TABS: 150 | 3 days supply | Qty: 2 | Fill #0

## 2017-09-19 MED FILL — BETAMETHASONE 0.1% OINTMENT: 0.1 | 14 days supply | Qty: 45 | Fill #0

## 2017-09-19 NOTE — Progress Notes (Signed)
GYNECOLOGY  VISIT   HPI: 32 y.o.   Married  Caucasian  female   G2P1002 with No LMP recorded. (Menstrual status: IUD).   here for possible yeast infection. She started the metrogel for BV and now has severe vulvar irritation, itching and thick clumpy vaginal d/c since yesterday.    GYNECOLOGIC HISTORY: No LMP recorded. (Menstrual status: IUD). Contraception:IUD Menopausal hormone therapy: none        OB History    Gravida  2   Para  2   Term  1   Preterm  0   AB  0   Living  2     SAB  0   TAB  0   Ectopic  0   Multiple  0   Live Births  2              Patient Active Problem List   Diagnosis Date Noted  . Gestational hypertension 12/06/2016  . 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 Pyelonephritis 09/02/2016  . Sjogren's syndrome ([place]) 08/29/2016  . Adult BMI > 30 10/24/2015  . Chronic migraine without aura without status migrainosus, not intractable 06/10/2015  . Anxiety     Past Medical History:  Diagnosis Date  . ADD (attention deficit disorder with hyperactivity)   . Adverse effect of unspecified general anesthetics, subsequent encounter    difficulty waking up from anesthesia  . Anxiety   . Family history of anesthesia complication    N&V  . Migraines   . PONV (postoperative nausea and vomiting)   . Sjogren's disease [facility])     Past Surgical History:  Procedure Laterality Date  . BREAST ENHANCEMENT SURGERY  2013   Dr. Lenon Curt  . CHOLECYSTECTOMY N/A 03/07/2016   Procedure: LAPAROSCOPIC CHOLECYSTECTOMY;  Surgeon: Coralie Keens, MD;  Location: [place];  Service: General;  Laterality: N/A;  . HYSTEROSCOPY N/A 10/15/2013   Procedure: HYSTEROSCOPY with removal of IUD;  Surgeon: Lyman Speller, MD;  Location: [place] [facility];  Service: Gynecology;  Laterality: N/A;  . INTRAUTERINE DEVICE (IUD) INSERTION  05/2017   Mirena   . WISDOM TOOTH EXTRACTION Bilateral 2008    Current Outpatient Medications  Medication Sig Dispense Refill  .  amphetamine-dextroamphetamine (ADDERALL) 10 MG tablet Take 10 mg by mouth daily with breakfast.    . cholestyramine (QUESTRAN) 4 GM/DOSE powder Take 4 g by mouth 2 (two) times daily.  12  . Lactobacillus-Inulin (PROBIOTIC DIGESTIVE SUPPORT PO) Take by mouth.    . levonorgestrel (MIRENA) 20 MCG/24HR IUD 1 each by Intrauterine route once.    . metroNIDAZOLE (METROGEL) 0.75 % vaginal gel Place 1 Applicatorful vaginally at bedtime. Use for 5 days 70 g 0   No current facility-administered medications for this visit.      ALLERGIES: Penicillins  Family History  Problem Relation Age of Onset  . Sarcoidosis Mother   . Heart disease Maternal Grandfather   . Diabetes Paternal Grandfather   . Heart disease Paternal Grandfather     Social History   Socioeconomic History  . Marital status: Married    Spouse name: Not on file  . Number of children: Not on file  . Years of education: Not on file  . Highest education level: Not on file  Occupational History  . Not on file  Social Needs  . Financial resource strain: Not on file  . Food insecurity:    Worry: Not on file    Inability: Not on file  . Transportation needs:  Medical: Not on file    Non-medical: Not on file  Tobacco Use  . Smoking status: Never Smoker  . Smokeless tobacco: Never Used  Substance and Sexual Activity  . Alcohol use: No    Alcohol/week: 0.0 standard drinks  . Drug use: No  . Sexual activity: Yes    Partners: Male    Birth control/protection: IUD    Comment: Mirena placed 05/29/17  Lifestyle  . Physical activity:    Days per week: Not on file    Minutes per session: Not on file  . Stress: Not on file  Relationships  . Social connections:    Talks on phone: Not on file    Gets together: Not on file    Attends religious service: Not on file    Active member of club or organization: Not on file    Attends meetings of clubs or organizations: Not on file    Relationship status: Not on file  . Intimate  partner violence:    Fear of current or ex partner: Not on file    Emotionally abused: Not on file    Physically abused: Not on file    Forced sexual activity: Not on file  Other Topics Concern  . Not on file  Social History Narrative   Drinks maybe 1 soda daily.    Review of Systems  Constitutional: Negative.   HENT: Negative.   Eyes: Negative.   Respiratory: Negative.   Cardiovascular: Negative.   Gastrointestinal: Negative.   Genitourinary: Negative.        Vaginal irritation  Musculoskeletal: Negative.   Skin: Negative.   Neurological: Negative.   Endo/Heme/Allergies: Negative.   Psychiatric/Behavioral: Negative.   All other systems reviewed and are negative.   PHYSICAL EXAMINATION:    BP 100/62   Pulse 68   Wt 169 lb (76.7 kg)   BMI 29.94 kg/m     General appearance: alert, cooperative and appears stated age   Pelvic: External genitalia:  no lesions, +erythema              Urethra:  normal appearing urethra with no masses, tenderness or lesions              Bartholins and Skenes: normal                 Vagina: erythematous appearing vagina with an increase in thick, clumpy white vaginal d/c              Cervix: no lesions   Wet prep: few clue (possible artifact), no trich, few wbc KOH: + yeast PH: 5                ASSESSMENT Yeast vaginitis    PLAN Treat with diflucan and steroid ointment   An After Visit Summary was printed and given to the patient.

## 2017-09-19 NOTE — Telephone Encounter (Signed)
Call from [facility] out-patient pharmacy. Betamethasone ointment not available. They have cream.  Advised needs to be ointment. Ointment is avaialble in 45 gram tube at [facility] out-patient pharmacy.  Left message on [facility] pharm voicemail to change disp amount to 45 gram tube.  Diflucan rx to be transferred as well.  Dr Talbert Nan agreeable.  Encounter closed.

## 2017-10-08 ENCOUNTER — Encounter

## 2017-10-08 ENCOUNTER — Ambulatory Visit: Payer: 59 | Admitting: Neurology

## 2017-10-08 ENCOUNTER — Encounter: Payer: Self-pay | Admitting: Neurology

## 2017-10-08 VITALS — BP 117/78 | HR 87 | Ht 63.0 in | Wt 168.0 lb

## 2017-10-08 DIAGNOSIS — G43709 Chronic migraine without aura, not intractable, without status migrainosus: Secondary | ICD-10-CM

## 2017-10-08 NOTE — Progress Notes (Addendum)
[facility]    Provider:  Dr Jaynee Eagles Referring Provider:  Talbert Nan. Sharee Pimple MD Primary Care Physician:  Talbert Nan. Sharee Pimple MD  CC:  Migraines  Interval history: Patient is here for follow-up of chronic migraines.  She has chronic intractable migraines and in the past had Botox injections which she did exceptionally well with unfortunately she became pregnant and had to stop.  The migraine injection worked so well and gave her greater than 50% decrease in frequency and severity.  Patient has 20 headache days a month and 12 of those are migrainous.  Migraines are unilateral, behind the eyes, pulsating pounding and throbbing, with light and sound sensitivity, nausea and vomiting, they are severe and it hurts her to even move.  No aura.  No medication overuse.  Migraines can last 24 to 72 hours.  Ongoing at this frequency for more than 9 months.  She is failed multiple medications.  Discussed restarting Botox.  Would not start her on CGRP as she is thinking about having another child.  No aura.  No medication overuse.  She is failed multiple classes of medications after good trial of at least 6 months on each medication.  HPI:  Teresa Holden is a 32 y.o. female here as a referral from Dr. Talbert Nan. Sharee Pimple MD or migraines. PMhx migraines, Sjogren's syndrome, Anxiety.  Symptoms started 10 years ago. [facility] and was placed on topamax with good results. She stopped it on her own when the symptoms improved. Headaches are worse with stress otherwise no known triggers, no known inciting events, no trauma. She was restarted on 25mg  of Topamax and she felt crummy and stopped it. She takes imitrex and feels hot and dizzy. She tried maxalt, relpax. Migraines 3-4x a week. She had one for 4 days last week. On the left side, pouding, throbbing, +light sensitivity, +Loud noises bother her, she has to lay down, +nausea but no vomiting. Has tension in the shoulders. She tried  lexapro in the past, naproxen, toradol, zofran, phenergan, zanaflex. She has been having migraines at this frequency for over 6 months. She has 20 headache days a month with at least 10 migrainous. Some mornings she wakes up with the headaches. The migraines last 24 hours or longer. They can be severe 10 out of 10 in pain. No vision changes, no dysarthria, no dysphagia, no neck pain, no other focal neurologic symptoms or deficits.No medication overuse. No family history of migraines. Mother with history of sarcoidosis. Toradol shots in the past that helped the best for acute management of migraines. No aura.  Tried: Topiramate, Imitrex (felt hot and dizzy), Maxalt, Relpax, Lexapro, naproxen, Toradol, Zofran, Phenergan, Zanaflex.   Reviewed notes, labs and imaging from outside physicians, which showed:   CT of the head 03/14/2011 showed No acute intracranial abnormalities including mass lesion or mass effect, hydrocephalus, extra-axial fluid collection, midline shift, hemorrhage, or acute infarction, large ischemic events (personally reviewed images) CBC and CMP unremarkable, creatinine 0.79. TSH 1.12 within normal limits.  Review of Systems: Patient complains of symptoms per HPI as well as the following symptoms: No chest pain, no shortness of breath. Pertinent negatives per HPI. All others negative.   Social History   Socioeconomic History  . Marital status: Married    Spouse name: Not on file  . Number of children: 2  . Years of education: Not on file  . Highest education level: Some college, no degree  Occupational History  . Not on file  Social Needs  . Financial resource strain: Not on file  . Food insecurity:    Worry: Not on file    Inability: Not on file  . Transportation needs:    Medical: Not on file    Non-medical: Not on file  Tobacco Use  . Smoking status: Never Smoker  . Smokeless tobacco: Never Used  Substance and Sexual Activity  . Alcohol use: No    Alcohol/week:  0.0 standard drinks  . Drug use: No  . Sexual activity: Yes    Partners: Male    Birth control/protection: IUD    Comment: Mirena placed 05/29/17  Lifestyle  . Physical activity:    Days per week: Not on file    Minutes per session: Not on file  . Stress: Not on file  Relationships  . Social connections:    Talks on phone: Not on file    Gets together: Not on file    Attends religious service: Not on file    Active member of club or organization: Not on file    Attends meetings of clubs or organizations: Not on file    Relationship status: Not on file  . Intimate partner violence:    Fear of current or ex partner: Not on file    Emotionally abused: Not on file    Physically abused: Not on file    Forced sexual activity: Not on file  Other Topics Concern  . Not on file  Social History Narrative   Lives at home with her husband and children   Right handed   Caffeine: none    Family History  Problem Relation Age of Onset  . Sarcoidosis Mother   . Heart disease Maternal Grandfather   . Diabetes Paternal Grandfather   . Heart disease Paternal Grandfather     Past Medical History:  Diagnosis Date  . ADD (attention deficit disorder with hyperactivity)   . Adverse effect of unspecified general anesthetics, subsequent encounter    difficulty waking up from anesthesia  . Anxiety   . Family history of anesthesia complication    N&V  . Migraines   . PONV (postoperative nausea and vomiting)   . Sjogren's disease [facility])     Past Surgical History:  Procedure Laterality Date  . BREAST ENHANCEMENT SURGERY  2013   Dr. Lenon Curt  . CHOLECYSTECTOMY N/A 03/07/2016   Procedure: LAPAROSCOPIC CHOLECYSTECTOMY;  Surgeon: Coralie Keens, MD;  Location: [place];  Service: General;  Laterality: N/A;  . HYSTEROSCOPY N/A 10/15/2013   Procedure: HYSTEROSCOPY with removal of IUD;  Surgeon: Lyman Speller, MD;  Location: [facility];  Service: Gynecology;  Laterality: N/A;  .  INTRAUTERINE DEVICE (IUD) INSERTION  05/2017   Mirena   . WISDOM TOOTH EXTRACTION Bilateral 2008    Current Outpatient Medications  Medication Sig Dispense Refill  . amphetamine-dextroamphetamine (ADDERALL) 10 MG tablet Take 10 mg by mouth daily with breakfast.    . cholestyramine (QUESTRAN) 4 GM/DOSE powder Take 4 g by mouth 2 (two) times daily.  12  . Lactobacillus-Inulin (PROBIOTIC DIGESTIVE SUPPORT PO) Take by mouth.    . levonorgestrel (MIRENA) 20 MCG/24HR IUD 1 each by Intrauterine route once.     No current facility-administered medications for this visit.     Allergies as of 10/08/2017 - Review Complete 10/08/2017  Allergen Reaction Noted  . Penicillins Rash 03/11/2011    Vitals: BP 117/78 (BP Location: Left Arm, Patient Position: Sitting)   Pulse 87   Ht 5'  3" (1.6 m)   Wt 168 lb (76.2 kg)   BMI 29.76 kg/m  Last Weight:  Wt Readings from Last 1 Encounters:  10/08/17 168 lb (76.2 kg)   Last Height:   Ht Readings from Last 1 Encounters:  10/08/17 5\' 3"  (1.6 m)   Physical exam: Exam: Gen: NAD, conversant, well nourised, obese, well groomed                     CV: RRR, no MRG. No Carotid Bruits. No peripheral edema, warm, nontender Eyes: Conjunctivae clear without exudates or hemorrhage  Neuro: Detailed Neurologic Exam  Speech:    Speech is normal; fluent and spontaneous with normal comprehension.  Cognition:    The patient is oriented to person, place, and time;     recent and remote memory intact;     language fluent;     normal attention, concentration,     fund of knowledge Cranial Nerves:    The pupils are equal, round, and reactive to light. The fundi are normal and spontaneous venous pulsations are present. Visual fields are full to finger confrontation. Extraocular movements are intact. Trigeminal sensation is intact and the muscles of mastication are normal. The face is symmetric. The palate elevates in the midline. Hearing intact. Voice is normal.  Shoulder shrug is normal. The tongue has normal motion without fasciculations.   Coordination:    Normal finger to nose and heel to shin. Normal rapid alternating movements.   Gait:    Heel-toe and tandem gait are normal.   Motor Observation:    No asymmetry, no atrophy, and no involuntary movements noted. Tone:    Normal muscle tone.    Posture:    Posture is normal. normal erect    Strength:    Strength is V/V in the upper and lower limbs.      Sensation: intact to LT     Reflex Exam:  DTR's:    Deep tendon reflexes in the upper and lower extremities are normal bilaterally.   Toes:    The toes are downgoing bilaterally.   Clonus:    Clonus is absent.       Assessment/Plan:  32 year old female with chronic migraines, without aura, not intractable without status migrainosus.  We had a long discussion about migraine management, migraine management in pregnancy, Botox and risks and benefits of using this during pregnancy, the new C GRP medications, retrying oral medications are daily preventatives but she really does not want to since she is failed so many over the years.  She had such a great response to Botox I do recommend restarting that.  -Patient was on Botox in the past and had a significant improvement greater than 50% improvement in migraine and headache severity and frequency.  Unfortunately she became pregnant and had to stop Botox.  At this time we would like to restart Botox.  -Failed multiple medications including topiramate, Trokendi, Imitrex (felt hot and dizzy), Maxalt, Relpax, Lexapro, naproxen, Toradol, Zofran, Phenergan, Zanaflex, Flexeril, diclofenac, tizanidine, tramadol and over the last several decades she is also tried and failed other medications including nortriptyline and tricyclic antidepressants, propranolol and other blood pressure medications, venlafaxine and other SNRIs or SSRIs.  Patient may be having another child but not advised on  CGRP.   CC: Jertson. Sharee Pimple MD  Sarina Ill, MD  [facility] [address] [place], [place] [address]  Phone [phone] Fax [phone]  A total of 40 minutes was spent  face-to-face with this patient. Over half this time was spent on counseling patient on the  1. Chronic migraine without aura without status migrainosus, not intractable     diagnosis and different diagnostic and therapeutic options, counseling and coordination of care, risks ans benefits of management, compliance, or risk factor reduction and education.

## 2017-10-09 NOTE — Progress Notes (Signed)
Indication/Diagnosis: chronic migraine BOTOX(J0585) injection was performed according to protocol by [facility]. 200 units of BOTOX was dissolved into 4 cc NS.  NDC: [address]-83  Botox-100unitsx2 vials Lot: [id number] Expiration: 07/2018 NDC: 2567-2091-98 [id number]  0.9% Sodium Chloride bacteriostatic- 54mL total Lot: [id number] Expiration: 07/05/2017 NDC: 5486-2824-17  Dx: B30.104 Specialty pharmacy

## 2017-10-10 ENCOUNTER — Telehealth: Payer: Self-pay | Admitting: Neurology

## 2017-10-10 NOTE — Telephone Encounter (Signed)
Teresa Holden, patient would like to re-initiate botox would you call her? thanks

## 2017-10-10 NOTE — Addendum Note (Signed)
Addended by: Sarina Ill B on: 10/10/2017 10:21 AM   Modules accepted: Level of Service

## 2017-10-14 NOTE — Telephone Encounter (Signed)
I called and scheduled the patient.  °

## 2017-10-30 ENCOUNTER — Telehealth: Payer: Self-pay | Admitting: Neurology

## 2017-11-03 MED FILL — AMPHETAMINE-DEXTROAMPHETAMI: 10 | 30 days supply | Qty: 30 | Fill #0

## 2017-11-03 NOTE — Telephone Encounter (Signed)
I called to check status of the patients coverage for Botox injections   [id number]-NPR [id number]-NPR [facility]#[id number]

## 2017-11-03 NOTE — Telephone Encounter (Signed)
I returned a call to the patient but she did not answer and her VM was full.

## 2017-11-04 NOTE — Telephone Encounter (Signed)
Pt is asking for a call to discuss other time frames for Botox.

## 2017-11-04 NOTE — Telephone Encounter (Signed)
I called the patient and she wants to keep her apt.

## 2017-11-05 ENCOUNTER — Ambulatory Visit: Payer: 59 | Admitting: Neurology

## 2017-11-05 ENCOUNTER — Telehealth: Payer: Self-pay

## 2017-11-05 ENCOUNTER — Telehealth: Payer: Self-pay | Admitting: Neurology

## 2017-11-05 ENCOUNTER — Ambulatory Visit (INDEPENDENT_AMBULATORY_CARE_PROVIDER_SITE_OTHER): Payer: 59 | Admitting: Neurology

## 2017-11-05 DIAGNOSIS — G43709 Chronic migraine without aura, not intractable, without status migrainosus: Secondary | ICD-10-CM

## 2017-11-05 MED ORDER — ONDANSETRON 4 MG PO TBDP: 4.0000 mg | ORAL_TABLET | Freq: Three times a day (TID) | ORAL | 3 refills | Status: DC | PRN

## 2017-11-05 MED ORDER — ELETRIPTAN HYDROBROMIDE 40 MG PO TABS: 40.0000 mg | ORAL_TABLET | ORAL | 0 refills | Status: DC | PRN

## 2017-11-05 MED FILL — ONDANSETRON ODT 4 MG TABLET: 4 | 10 days supply | Qty: 30 | Fill #0

## 2017-11-05 NOTE — Telephone Encounter (Signed)
Pending approval for Eletriptan 40 mg tablet Key: [id number] PA Case ID: [id number] MedImpact  ICD 10 code: W06.840 [facility]  Fax Reference: [facility] Phone number: [phone] Fax number: [phone]

## 2017-11-05 NOTE — Progress Notes (Signed)
Botox- 100 units x 2 vials Lot: [id number] Expiration: 04/2020 NDC: 1856-3149-70  Bacteriostatic 0.9% Sodium Chloride- 55mL total Lot: [id number] Expiration: 11/05/2018 NDC: 8850-2774-12  Dx: I78.676 B/B

## 2017-11-05 NOTE — Progress Notes (Signed)

## 2017-11-05 NOTE — Telephone Encounter (Signed)
Next botox

## 2017-11-10 DIAGNOSIS — H52223 Regular astigmatism, bilateral: Secondary | ICD-10-CM | POA: Diagnosis not present

## 2017-11-10 DIAGNOSIS — H5213 Myopia, bilateral: Secondary | ICD-10-CM | POA: Diagnosis not present

## 2017-11-10 NOTE — Telephone Encounter (Signed)
Add'l questions received from [place] asking if pt has tried oral sumatriptan and rizatriptan. Pt has tried oral sumatriptan and maxalt. Had side effects to Sumatriptan. Received a receipt of confirmation.

## 2017-11-11 NOTE — Telephone Encounter (Signed)
Received incoming fax from [place]. Eletriptan 40 mg has been approved from 11/10/2017 through 11/10/2018. Called [facility] outpatient pharmacy and informed Teresa Holden of the approval. She was able to confirm and is aware that the approval is for one year ending 11/10/2018.

## 2017-11-13 NOTE — Telephone Encounter (Signed)
I called and scheduled the patient.  °

## 2017-12-12 ENCOUNTER — Ambulatory Visit (INDEPENDENT_AMBULATORY_CARE_PROVIDER_SITE_OTHER): Payer: 59 | Admitting: Obstetrics & Gynecology

## 2017-12-12 VITALS — BP 108/62 | HR 84 | Resp 16 | Ht 63.0 in | Wt 171.0 lb

## 2017-12-12 DIAGNOSIS — N898 Other specified noninflammatory disorders of vagina: Secondary | ICD-10-CM | POA: Diagnosis not present

## 2017-12-12 MED ORDER — METRONIDAZOLE 500 MG PO TABS: 500.0000 mg | ORAL_TABLET | Freq: Two times a day (BID) | ORAL | 0 refills | Status: DC

## 2017-12-12 MED FILL — metroNIDAZOLE 500 MG TABS: 500 | 7 days supply | Qty: 14 | Fill #0

## 2017-12-12 NOTE — Progress Notes (Signed)
GYNECOLOGY  VISIT  CC:   Vaginal odor  HPI: 32 y.o. G51P1002 Married White or Caucasian female here for complaint of recurrent vaginal odor that seems to have been present since Mirena IUD was placed 05/29/17/  Has been evaluated twice for vaginitis, one of these included STD testing as well.  Has ben treated with Tindamax, flagyl, hydrogen peroxide douching.  Odor keeps returning.  Does have some mild vaginal discharge with this.  Not having vaginal bleeding.    GYNECOLOGIC HISTORY: No LMP recorded. (Menstrual status: IUD). Contraception: Mirena IUD  Patient Active Problem List   Diagnosis Date Noted  . Gestational hypertension 12/06/2016  . 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 Pyelonephritis 09/02/2016  . Sjogren's syndrome ([place]) 08/29/2016  . Adult BMI > 30 10/24/2015  . Chronic migraine without aura without status migrainosus, not intractable 06/10/2015  . Anxiety     Past Medical History:  Diagnosis Date  . ADD (attention deficit disorder with hyperactivity)   . Adverse effect of unspecified general anesthetics, subsequent encounter    difficulty waking up from anesthesia  . Anxiety   . Family history of anesthesia complication    N&V  . Migraines   . PONV (postoperative nausea and vomiting)   . Sjogren's disease [facility])     Past Surgical History:  Procedure Laterality Date  . BREAST ENHANCEMENT SURGERY  2013   Dr. Lenon Curt  . CHOLECYSTECTOMY N/A 03/07/2016   Procedure: LAPAROSCOPIC CHOLECYSTECTOMY;  Surgeon: Coralie Keens, MD;  Location: [facility];  Service: General;  Laterality: N/A;  . HYSTEROSCOPY N/A 10/15/2013   Procedure: HYSTEROSCOPY with removal of IUD;  Surgeon: Lyman Speller, MD;  Location: [facility];  Service: Gynecology;  Laterality: N/A;  . INTRAUTERINE DEVICE (IUD) INSERTION  05/2017   Mirena   . WISDOM TOOTH EXTRACTION Bilateral 2008    MEDS:   Current Outpatient Medications on File Prior to Visit  Medication Sig Dispense Refill  .  amphetamine-dextroamphetamine (ADDERALL) 10 MG tablet Take 10 mg by mouth daily with breakfast.    . cholestyramine (QUESTRAN) 4 GM/DOSE powder Take 4 g by mouth 2 (two) times daily.  12  . eletriptan (RELPAX) 40 MG tablet Take 1 tablet (40 mg total) by mouth as needed for migraine or headache. May repeat in 2 hours if headache persists or recurs. 10 tablet 0  . Lactobacillus-Inulin (PROBIOTIC DIGESTIVE SUPPORT PO) Take by mouth.    . levonorgestrel (MIRENA) 20 MCG/24HR IUD 1 each by Intrauterine route once.    . ondansetron (ZOFRAN-ODT) 4 MG disintegrating tablet Take 1 tablet (4 mg total) by mouth every 8 (eight) hours as needed for nausea. 30 tablet 3   No current facility-administered medications on file prior to visit.     ALLERGIES: Penicillins  Family History  Problem Relation Age of Onset  . Sarcoidosis Mother   . Heart disease Maternal Grandfather   . Diabetes Paternal Grandfather   . Heart disease Paternal Grandfather     SH:  Married, non smoker  Review of Systems  Constitutional: Negative.   Gastrointestinal: Negative.   Genitourinary:       Vaginal odor    PHYSICAL EXAMINATION:    BP 108/62   Pulse 84   Resp 16   Ht 5\' 3"  (1.6 m)   Wt 171 lb (77.6 kg)   BMI 30.29 kg/m     General appearance: alert, cooperative and appears stated age Lymph:  no inguinal LAD noted  Pelvic: External genitalia:  no lesions  Urethra:  normal appearing urethra with no masses, tenderness or lesions              Bartholins and Skenes: normal                 Vagina: normal appearing vagina with normal color and discharge, no lesions              Cervix: no lesions and IUD string noted              Bimanual Exam:  Uterus:  normal size, contour, position, consistency, mobility, non-tender              Adnexa: no mass, fullness, tenderness  Assessment: Recurrent vaginal odor that seems to have started after Mirena IUD placement  Plan: Vaginitis testing obtained again  today.  If negative, will treat with flagyl 500mg  bid x 7 days.   If odor returns, plan PUS May need to consider IUD removal

## 2017-12-13 LAB — VAGINITIS/VAGINOSIS, DNA PROBE
Candida Species: NEGATIVE
Gardnerella vaginalis: NEGATIVE
Trichomonas vaginosis: NEGATIVE

## 2017-12-15 ENCOUNTER — Encounter: Payer: Self-pay | Admitting: Obstetrics & Gynecology

## 2018-01-07 ENCOUNTER — Encounter: Payer: Self-pay | Admitting: Sports Medicine

## 2018-01-07 ENCOUNTER — Ambulatory Visit: Payer: 59 | Admitting: Sports Medicine

## 2018-01-07 VITALS — BP 99/68 | HR 65 | Resp 16 | Ht 63.0 in | Wt 170.0 lb

## 2018-01-07 DIAGNOSIS — M2040 Other hammer toe(s) (acquired), unspecified foot: Secondary | ICD-10-CM

## 2018-01-07 DIAGNOSIS — M79672 Pain in left foot: Secondary | ICD-10-CM

## 2018-01-07 DIAGNOSIS — D3610 Benign neoplasm of peripheral nerves and autonomic nervous system, unspecified: Secondary | ICD-10-CM | POA: Diagnosis not present

## 2018-01-07 DIAGNOSIS — M779 Enthesopathy, unspecified: Secondary | ICD-10-CM

## 2018-01-07 MED ORDER — METHYLPREDNISOLONE 4 MG PO TBPK
ORAL_TABLET | ORAL | 0 refills | Status: DC
Start: 2018-01-07 — End: 2018-01-12

## 2018-01-07 NOTE — Progress Notes (Signed)
   Subjective:    Patient ID: Teresa Holden, female    DOB: Teresa Holden, 1987, 32 y.o.   MRN: [id number]  HPI    Review of Systems  Musculoskeletal: Positive for arthralgias and myalgias.  All other systems reviewed and are negative.      Objective:   Physical Exam        Assessment & Plan:

## 2018-01-07 NOTE — Progress Notes (Signed)
Subjective: Teresa Holden is a 32 y.o. female patient who presents to office for evaluation of left foot pain. Patient complains of progressive pain especially over the last few weeks. Ranks pain 4-5/10 and is now interferring with daily activities. Patient has tried over-the-counter Motrin with no relief in symptoms.  Reports that it feels like she is walking on something and the pain comes and goes.  Patient denies any other pedal complaints. Denies injury/trip/fall/sprain/any causative factors.   Review of Systems  Musculoskeletal: Positive for joint pain and myalgias.  All other systems reviewed and are negative.    Patient Active Problem List   Diagnosis Date Noted  . Gestational hypertension 12/06/2016  . 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 Pyelonephritis 09/02/2016  . Sjogren's syndrome ([place]) 08/29/2016  . Adult BMI > 30 10/24/2015  . Chronic migraine without aura without status migrainosus, not intractable 06/10/2015  . Anxiety     Current Outpatient Medications on File Prior to Visit  Medication Sig Dispense Refill  . amphetamine-dextroamphetamine (ADDERALL) 10 MG tablet Take 10 mg by mouth daily with breakfast.    . cholestyramine (QUESTRAN) 4 GM/DOSE powder Take 4 g by mouth 2 (two) times daily.  12  . eletriptan (RELPAX) 40 MG tablet Take 1 tablet (40 mg total) by mouth as needed for migraine or headache. May repeat in 2 hours if headache persists or recurs. 10 tablet 0  . Lactobacillus-Inulin (PROBIOTIC DIGESTIVE SUPPORT PO) Take by mouth.    . levonorgestrel (MIRENA) 20 MCG/24HR IUD 1 each by Intrauterine route once.    . ondansetron (ZOFRAN-ODT) 4 MG disintegrating tablet Take 1 tablet (4 mg total) by mouth every 8 (eight) hours as needed for nausea. 30 tablet 3   No current facility-administered medications on file prior to visit.     Allergies  Allergen Reactions  . Penicillins Rash    Has patient had a PCN reaction causing immediate rash,  facial/tongue/throat swelling, SOB or lightheadedness with hypotension: Yes Has patient had a PCN reaction causing severe rash involving mucus membranes or skin necrosis: Yes Has patient had a PCN reaction that required hospitalization: No Has patient had a PCN reaction occurring within the last 10 years: No If all of the above answers are "NO", then may proceed with Cephalosporin use.    Objective:  General: Alert and oriented x3 in no acute distress  Dermatology: No open lesions bilateral lower extremities, no webspace macerations, no ecchymosis bilateral, all nails x 10 are well manicured.  Vascular: Dorsalis Pedis and Posterior Tibial pedal pulses palpable, Capillary Fill Time 3 seconds,(+) pedal hair growth bilateral, no edema bilateral lower extremities, Temperature gradient within normal limits.  Neurology: Johney Maine sensation intact via light touch bilateral.  Musculoskeletal: Mild tenderness with palpation at left plantar forefoot underneath the second greater than third metatarsal phalangeal joint, subjective stiffness to toes.  Lesser hammertoe deformity. Strength within normal limits in all groups bilateral.   Gait: Non-Antalgic gait  Assessment and Plan: Problem List Items Addressed This Visit    None    Visit Diagnoses    Left foot pain    -  Primary   Relevant Orders   DG Foot Complete Left   Capsulitis       Hammer toe, unspecified laterality       Neuroma           -Complete examination performed -Patient declined x-rays at this visit -Discussed treatement options for possible biomechanical pain secondary to hammertoes versus capsulitis versus neuroma I  did advise patient that my diagnoses are limited because of her refusal of x-ray however we will treat her conservatively at this time and if pain recurs will x-ray at next visit -Dispensed metatarsal pad and educated patient on use and advised patient of these work well to get over-the-counter Dr. Felicie Morn metatarsal  pads -Rx Medrol Dosepak to take as instructed and advised patient if pain continues may call for a prescription of meloxicam as well -Recommend good supportive shoes and to decrease activities that may increase forefoot loading -Patient to return to office if pain and symptoms are not better after 2 weeks or sooner if condition worsens.  Landis Martins, DPM

## 2018-01-12 ENCOUNTER — Ambulatory Visit: Payer: 59 | Admitting: Obstetrics & Gynecology

## 2018-01-12 ENCOUNTER — Encounter: Payer: Self-pay | Admitting: Obstetrics & Gynecology

## 2018-01-12 ENCOUNTER — Ambulatory Visit (INDEPENDENT_AMBULATORY_CARE_PROVIDER_SITE_OTHER): Payer: 59 | Admitting: Obstetrics & Gynecology

## 2018-01-12 ENCOUNTER — Other Ambulatory Visit: Payer: Self-pay

## 2018-01-12 VITALS — BP 112/80 | HR 84 | Resp 16 | Ht 64.0 in | Wt 170.6 lb

## 2018-01-12 DIAGNOSIS — Z01419 Encounter for gynecological examination (general) (routine) without abnormal findings: Secondary | ICD-10-CM | POA: Diagnosis not present

## 2018-01-12 MED ORDER — NONFORMULARY OR COMPOUNDED ITEM: 3 refills | Status: DC

## 2018-01-12 NOTE — Progress Notes (Addendum)
32 y.o. G33P1002 Married White or Caucasian female here for annual exam.  Using Mirena for contraception.  Has trouble with OCPs and taking them regularly.  Having continued intermittent vaginal odor.  Did take Flagyl 500mg  bid x 7 days.  Has used baking soda douche.  Vaginitis testing x 3 times has been negative.  August testing was negative for GC and Chl.  Does not cycle.    Nursed for about two months post partum.  She did have a post partum pap smear but is not sure about the HR HPV testing.    No LMP recorded. (Menstrual status: IUD).          Sexually active: Yes.    The current method of family planning is IUD. Mirena placed 05/29/17  Exercising: Yes.     Smoker:  no  Health Maintenance: Pap:  2018 Normal   05/09/14 Neg. HR HPV:neg  History of abnormal Pap:  yes MMG:  Never TDaP:  2010 Screening Labs: will come back fasting    reports that she has never smoked. She has never used smokeless tobacco. She reports that she does not drink alcohol or use drugs.  Past Medical History:  Diagnosis Date  . ADD (attention deficit disorder with hyperactivity)   . Adverse effect of unspecified general anesthetics, subsequent encounter    difficulty waking up from anesthesia  . Anxiety   . Family history of anesthesia complication    N&V  . Migraines   . PONV (postoperative nausea and vomiting)   . Sjogren's disease [facility])     Past Surgical History:  Procedure Laterality Date  . BREAST ENHANCEMENT SURGERY  2013   Dr. Lenon Curt  . CHOLECYSTECTOMY N/A 03/07/2016   Procedure: LAPAROSCOPIC CHOLECYSTECTOMY;  Surgeon: Coralie Keens, MD;  Location: [place];  Service: General;  Laterality: N/A;  . HYSTEROSCOPY N/A 10/15/2013   Procedure: HYSTEROSCOPY with removal of IUD;  Surgeon: Lyman Speller, MD;  Location: [place];  Service: Gynecology;  Laterality: N/A;  . INTRAUTERINE DEVICE (IUD) INSERTION  05/2017   Mirena   . WISDOM TOOTH EXTRACTION Bilateral 2008    Current  Outpatient Medications  Medication Sig Dispense Refill  . amphetamine-dextroamphetamine (ADDERALL) 10 MG tablet Take 10 mg by mouth daily with breakfast.    . cholestyramine (QUESTRAN) 4 GM/DOSE powder Take 4 g by mouth 2 (two) times daily.  12  . eletriptan (RELPAX) 40 MG tablet Take 1 tablet (40 mg total) by mouth as needed for migraine or headache. May repeat in 2 hours if headache persists or recurs. 10 tablet 0  . Lactobacillus-Inulin (PROBIOTIC DIGESTIVE SUPPORT PO) Take by mouth.    . levonorgestrel (MIRENA) 20 MCG/24HR IUD 1 each by Intrauterine route once.    . methylPREDNISolone (MEDROL DOSEPAK) 4 MG TBPK tablet Take as directed 21 tablet 0  . ondansetron (ZOFRAN-ODT) 4 MG disintegrating tablet Take 1 tablet (4 mg total) by mouth every 8 (eight) hours as needed for nausea. 30 tablet 3   No current facility-administered medications for this visit.     Family History  Problem Relation Age of Onset  . Sarcoidosis Mother   . Heart disease Maternal Grandfather   . Diabetes Paternal Grandfather   . Heart disease Paternal Grandfather     Review of Systems  All other systems reviewed and are negative.   Exam:   BP 112/80 (BP Location: Right Arm, Patient Position: Sitting, Cuff Size: Large)   Pulse 84   Resp 16  Ht 5\' 4"  (1.626 m)   Wt 170 lb 9.6 oz (77.4 kg)   Breastfeeding? No   BMI 29.28 kg/m    Height: 5\' 4"  (162.6 cm)  Ht Readings from Last 3 Encounters:  01/12/18 5\' 4"  (1.626 m)  01/07/18 5\' 3"  (1.6 m)  12/12/17 5\' 3"  (1.6 m)    General appearance: alert, cooperative and appears stated age Head: Normocephalic, without obvious abnormality, atraumatic Neck: no adenopathy, supple, symmetrical, trachea midline and thyroid normal to inspection and palpation Lungs: clear to auscultation bilaterally Breasts: normal appearance, no masses or tenderness Heart: regular rate and rhythm Abdomen: soft, non-tender; bowel sounds normal; no masses,  no organomegaly Extremities:  extremities normal, atraumatic, no cyanosis or edema Skin: Skin color, texture, turgor normal. No rashes or lesions Lymph nodes: Cervical, supraclavicular, and axillary nodes normal. No abnormal inguinal nodes palpated Neurologic: Grossly normal   Pelvic: External genitalia:  no lesions              Urethra:  normal appearing urethra with no masses, tenderness or lesions              Bartholins and Skenes: normal                 Vagina: normal appearing vagina with normal color and discharge, no lesions              Cervix: no lesions, IUD string noted              Pap taken: No. Bimanual Exam:  Uterus:  normal size, contour, position, consistency, mobility, non-tender              Adnexa: normal adnexa and no mass, fullness, tenderness               Rectovaginal: Confirms               Anus:  normal sphincter tone, no lesions  Chaperone was present for exam.  A:  Well Woman with normal exam Sjogren's disease, not seeing anyone at this time Recurrent vaginal odor Mirena IUD placed 4/52/19.  Has amenorrhea with this. Migraines  P:   Mammogram guidelines reviewed pap smear obtained today.  If was done and HR HPV testing negative, will not order.  Will hold until gets records which were signed today. Trial of boric acid 600mg  pv twice weekly to [facility]. return annually or prn

## 2018-02-16 ENCOUNTER — Telehealth: Payer: Self-pay | Admitting: Neurology

## 2018-02-16 NOTE — Telephone Encounter (Signed)
I called the patients insurance to check coverage and pre cert requirements. NPR, covered service, eligible for B/B. GRJ#[id number]. DW

## 2018-02-18 ENCOUNTER — Ambulatory Visit (INDEPENDENT_AMBULATORY_CARE_PROVIDER_SITE_OTHER): Payer: 59 | Admitting: Neurology

## 2018-02-18 DIAGNOSIS — G43709 Chronic migraine without aura, not intractable, without status migrainosus: Secondary | ICD-10-CM

## 2018-02-18 DIAGNOSIS — M7918 Myalgia, other site: Secondary | ICD-10-CM

## 2018-02-18 NOTE — Progress Notes (Signed)
Consent Form Botulism Toxin Injection For Chronic Migraine   Interval history 02/18/2018: This is patient's 2nd botox. At baseline she has 20 headache days a month and 12 of those are migrainous. Improved to 2 migraine days a month and 8 headache days just after one injection > 50% improvement excellent response.  +all  Will refer for Dry Needling for cervical myofascial pain syndrome  Reviewed orally with patient, additionally signature is on file:  Botulism toxin has been approved by the Federal drug administration for treatment of chronic migraine. Botulism toxin does not cure chronic migraine and it may not be effective in some patients.  The administration of botulism toxin is accomplished by injecting a small amount of toxin into the muscles of the neck and head. Dosage must be titrated for each individual. Any benefits resulting from botulism toxin tend to wear off after 3 months with a repeat injection required if benefit is to be maintained. Injections are usually done every 3-4 months with maximum effect peak achieved by about 2 or 3 weeks. Botulism toxin is expensive and you should be sure of what costs you will incur resulting from the injection.  The side effects of botulism toxin use for chronic migraine may include:   -Transient, and usually mild, facial weakness with facial injections  -Transient, and usually mild, head or neck weakness with head/neck injections  -Reduction or loss of forehead facial animation due to forehead muscle weakness  -Eyelid drooping  -Dry eye  -Pain at the site of injection or bruising at the site of injection  -Double vision  -Potential unknown long term risks  Contraindications: You should not have Botox if you are pregnant, nursing, allergic to albumin, have an infection, skin condition, or muscle weakness at the site of the injection, or have myasthenia gravis, Lambert-Eaton syndrome, or ALS.  It is also possible that as with any injection,  there may be an allergic reaction or no effect from the medication. Reduced effectiveness after repeated injections is sometimes seen and rarely infection at the injection site may occur. All care will be taken to prevent these side effects. If therapy is given over a long time, atrophy and wasting in the muscle injected may occur. Occasionally the patient's become refractory to treatment because they develop antibodies to the toxin. In this event, therapy needs to be modified.  I have read the above information and consent to the administration of botulism toxin.    BOTOX PROCEDURE NOTE FOR MIGRAINE HEADACHE    Contraindications and precautions discussed with patient(above). Aseptic procedure was observed and patient tolerated procedure. Procedure performed by Dr. Georgia Dom  The condition has existed for more than 6 months, and pt does not have a diagnosis of ALS, Myasthenia Gravis or Lambert-Eaton Syndrome.  Risks and benefits of injections discussed and pt agrees to proceed with the procedure.  Written consent obtained  These injections are medically necessary. Pt  receives good benefits from these injections. These injections do not cause sedations or hallucinations which the oral therapies may cause.  Description of procedure:  The patient was placed in a sitting position. The standard protocol was used for Botox as follows, with 5 units of Botox injected at each site:   -Procerus muscle, midline injection  -Corrugator muscle, bilateral injection  -Frontalis muscle, bilateral injection, with 2 sites each side, medial injection was performed in the upper one third of the frontalis muscle, in the region vertical from the medial inferior edge of the superior orbital  rim. The lateral injection was again in the upper one third of the forehead vertically above the lateral limbus of the cornea, 1.5 cm lateral to the medial injection site.  - Levator Scapulae: 5 units  bilaterally  -Temporalis muscle injection, 5 sites, bilaterally. The first injection was 3 cm above the tragus of the ear, second injection site was 1.5 cm to 3 cm up from the first injection site in line with the tragus of the ear. The third injection site was 1.5-3 cm forward between the first 2 injection sites. The fourth injection site was 1.5 cm posterior to the second injection site. 5th site laterally in the temporalis  muscleat the level of the outer canthus.  - Patient feels her clenching is a trigger for headaches. +5 units masseter bilaterally   - Patient feels the migraines are centered around the eyes +5 units bilaterally at the outer canthus in the orbicularis occuli  -Occipitalis muscle injection, 3 sites, bilaterally. The first injection was done one half way between the occipital protuberance and the tip of the mastoid process behind the ear. The second injection site was done lateral and superior to the first, 1 fingerbreadth from the first injection. The third injection site was 1 fingerbreadth superiorly and medially from the first injection site.  -Cervical paraspinal muscle injection, 2 sites, bilateral knee first injection site was 1 cm from the midline of the cervical spine, 3 cm inferior to the lower border of the occipital protuberance. The second injection site was 1.5 cm superiorly and laterally to the first injection site.  -Trapezius muscle injection was performed at 3 sites, bilaterally. The first injection site was in the upper trapezius muscle halfway between the inflection point of the neck, and the acromion. The second injection site was one half way between the acromion and the first injection site. The third injection was done between the first injection site and the inflection point of the neck.   Will return for repeat injection in 3 months.   A 200 unit sof Botox was used, any Botox not injected was wasted. The patient tolerated the procedure well, there were  no complications of the above procedure.

## 2018-02-18 NOTE — Progress Notes (Signed)
Botox- 100 units x 2 vials Lot: [id number] Expiration: 07/2020 NDC: 9249-3241-99  Bacteriostatic 0.9% Sodium Chloride- 20mL total Lot: [id number] Expiration: 11/05/2018 NDC: 4835-0757-32  Dx: Q56.720 B/B

## 2018-02-19 ENCOUNTER — Encounter: Payer: Self-pay | Admitting: Neurology

## 2018-02-20 ENCOUNTER — Other Ambulatory Visit: Payer: Self-pay | Admitting: Neurology

## 2018-02-20 MED ORDER — KETOROLAC TROMETHAMINE 10 MG PO TABS: 10.0000 mg | ORAL_TABLET | Freq: Four times a day (QID) | ORAL | 3 refills | Status: DC | PRN

## 2018-02-20 MED FILL — KETOROLAC 10 MG TABLET: 10 | 30 days supply | Qty: 20 | Fill #0

## 2018-03-17 ENCOUNTER — Other Ambulatory Visit: Payer: Self-pay | Admitting: Family Medicine

## 2018-03-17 ENCOUNTER — Ambulatory Visit: Payer: Self-pay

## 2018-03-17 DIAGNOSIS — M25532 Pain in left wrist: Secondary | ICD-10-CM

## 2018-03-26 NOTE — Addendum Note (Signed)
Addended by: Allean Found on: 03/26/2018 08:28 AM   Modules accepted: Orders

## 2018-04-06 ENCOUNTER — Telehealth: Payer: Self-pay | Admitting: *Deleted

## 2018-04-06 DIAGNOSIS — M3500 Sicca syndrome, unspecified: Secondary | ICD-10-CM

## 2018-04-06 NOTE — Telephone Encounter (Signed)
Patient requests referral to rheumatology.  Previously patient of Dr Patrecia Pour but has been > 3 years since last seen. History of Sjogrens disease. Complains of increased joint pain and  "brain fog."  Please advise.

## 2018-04-07 NOTE — Telephone Encounter (Signed)
Order has been placed for referral to Dr. Estanislado Pandy.  Encounter closed.

## 2018-04-12 NOTE — Progress Notes (Signed)
Teresa Holden is a 33 y.o. female is here to establish care.  Assessment and Plan:   Sjogren's syndrome [facility]) Patient has an upcoming appointment with Rheumatology.   Chronic migraine without aura without status migrainosus, not intractable She has recently restarted botox injections. Worked well in the past. We discussed Emgality if botox not as helpful this time. Reviewed the importance of sleep, self care, stress reduction.  Chronic fatigue Ongoing. The patient is asked to make an attempt to improve diet and exercise patterns to aid in medical management of this problem.   Attention deficit hyperactivity disorder (ADHD), predominantly inattentive type Plan:  1. Goals:  What improvements would you most like to see? Help with focus at work and home.  2. Plans to reach these goals: Treatment with medication. 3. Medication Management:  Stimulant: see orders.  4. Discussion: The benefits of stimulant medication treatment appear to outweigh the current risks. We discussed risks and benefits of different medications. I recommended and discussed appropriate dietary modifications and routine exercise. Stimulant medication management, careful titration, and dose optimization of stimulant medication was discussed as the treatment option with the best scientific evidence helping reduce ADHD symptoms. There is no cure for ADHD. Stimulant medication side effects: The temporary side effects of stimulants including decreased appetite, sleep disturbance, mood changes, personality changes, ticks, increases in heart rate and blood pressure, abdominal pain, nausea, vomiting, and dry mouth were discussed.   Orders Placed This Encounter  Procedures  . Tdap vaccine greater than or equal to 7yo IM  . CBC with Differential/Platelet  . Comprehensive metabolic panel  . Hemoglobin A1c  . Lipid panel  . Iron, TIBC and Ferritin Panel  . TSH  . Vitamin B12  . VITAMIN D 25 Hydroxy (Vit-D Deficiency,  Fractures)   Meds ordered this encounter  Medications  . amphetamine-dextroamphetamine (ADDERALL XR) 20 MG 24 hr capsule    Sig: Take 1 capsule (20 mg total) by mouth daily.    Dispense:  30 capsule    Refill:  0   Subjective:   HPI:  See Assessment and Plan section for Problem Based Charting of issues discussed today.   Health Maintenance:   There are no preventive care reminders to display for this patient. Depression screen [facility] 2/9 09/07/2014 06/30/2014  Decreased Interest 0 0  Down, Depressed, Hopeless 0 0  PHQ - 2 Score 0 0   PMHx, SurgHx, SocialHx, FamHx, Medications, and Allergies were reviewed in the Visit Navigator and updated as appropriate.   Patient Active Problem List   Diagnosis Date Noted  . Attention deficit hyperactivity disorder (ADHD), predominantly inattentive type 04/14/2018  . Chronic fatigue 04/14/2018  . 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 Sjogren's syndrome ([place]) 08/29/2016  . Chronic migraine without aura without status migrainosus, not intractable 06/10/2015  . Anxiety    Social History   Tobacco Use  . Smoking status: Never Smoker  . Smokeless tobacco: Never Used  Substance Use Topics  . Alcohol use: No    Alcohol/week: 0.0 standard drinks  . Drug use: No   Current Medications and Allergies:   .  amphetamine-dextroamphetamine (ADDERALL) 10 MG tablet, Take 10 mg by mouth daily with breakfast., Disp: , Rfl:  .  cholestyramine (QUESTRAN) 4 GM/DOSE powder, Take 4 g by mouth 2 (two) times daily., Disp: , Rfl: 12 .  eletriptan (RELPAX) 40 MG tablet, Take 1 tablet (40 mg total) by mouth as needed for migraine or headache. May repeat in 2 hours if  headache persists or recurs., Disp: 10 tablet, Rfl: 0 .  ketorolac (TORADOL) 10 MG tablet, Take 1 tablet (10 mg total) by mouth every 6 (six) hours as needed. Max 40mg  a day. Max 5 days a month., Disp: 20 tablet, Rfl: 3 .  Lactobacillus-Inulin (PROBIOTIC DIGESTIVE SUPPORT PO), Take by mouth., Disp: , Rfl:    .  levonorgestrel (MIRENA) 20 MCG/24HR IUD, 1 each by Intrauterine route once., Disp: , Rfl:  .  NONFORMULARY OR COMPOUNDED ITEM, Boric acid 600mg  vaginal suppositories.  1pv twice weekly., Disp: 24 each, Rfl: 3 .  ondansetron (ZOFRAN-ODT) 4 MG disintegrating tablet, Take 1 tablet (4 mg total) by mouth every 8 (eight) hours as needed for nausea., Disp: 30 tablet, Rfl: 3    Allergies  Allergen Reactions  . Penicillins Rash    Has patient had a PCN reaction causing immediate rash, facial/tongue/throat swelling, SOB or lightheadedness with hypotension: Yes Has patient had a PCN reaction causing severe rash involving mucus membranes or skin necrosis: Yes Has patient had a PCN reaction that required hospitalization: No Has patient had a PCN reaction occurring within the last 10 years: No If all of the above answers are "NO", then may proceed with Cephalosporin use.   Review of Systems   Pertinent items are noted in the HPI. Otherwise, ROS is negative.  Vitals:   Vitals:   04/13/18 1536  BP: 116/78  Pulse: 84  Temp: 98.6 F (37 C)  TempSrc: Oral  SpO2: 98%  Weight: 172 lb 12.8 oz (78.4 kg)  Height: 5\' 4"  (1.626 m)     Body mass index is 29.66 kg/m. Physical Exam:   General: Cooperative, alert and oriented, well developed, well nourished, in no acute distress. HEENT: Pupils equal round reactive light and extraocular movements intact. Conjunctivae and lids unremarkable. No pallor or cyanosis, dentition good. Neck: No thyromegaly.  Cardiovascular: Regular rhythm. No murmurs appreciated.  Lungs: Normal work of breathing. Clear bilaterally without rales, rhonchi, or wheezing.  Abdomen: Soft, nontender, no masses. Normal bowel sounds. Extremities: No clubbing, cyanosis, erythema. No edema.  Skin: Warm and dry. Neurologic: No focal deficits.  Psychiatric: Normal affect and thought content.   . Reviewed expectations re: course of current medical issues. . Discussed self-management  of symptoms. . Outlined signs and symptoms indicating need for more acute intervention. . Patient verbalized understanding and all questions were answered. Marland Kitchen Health Maintenance issues including appropriate healthy diet, exercise, and smoking avoidance were discussed with patient. . See orders for this visit as documented in the electronic medical record. . Patient received an After Visit Summary.  Briscoe Deutscher, DO [place], [facility] 04/19/2018

## 2018-04-13 ENCOUNTER — Ambulatory Visit (INDEPENDENT_AMBULATORY_CARE_PROVIDER_SITE_OTHER): Payer: 59 | Admitting: Family Medicine

## 2018-04-13 ENCOUNTER — Encounter: Payer: Self-pay | Admitting: Family Medicine

## 2018-04-13 VITALS — BP 116/78 | HR 84 | Temp 98.6°F | Ht 64.0 in | Wt 172.8 lb

## 2018-04-13 DIAGNOSIS — F900 Attention-deficit hyperactivity disorder, predominantly inattentive type: Secondary | ICD-10-CM | POA: Diagnosis not present

## 2018-04-13 DIAGNOSIS — M3503 Sicca syndrome with myopathy: Secondary | ICD-10-CM | POA: Diagnosis not present

## 2018-04-13 DIAGNOSIS — E559 Vitamin D deficiency, unspecified: Secondary | ICD-10-CM

## 2018-04-13 DIAGNOSIS — G43709 Chronic migraine without aura, not intractable, without status migrainosus: Secondary | ICD-10-CM | POA: Diagnosis not present

## 2018-04-13 DIAGNOSIS — Z23 Encounter for immunization: Secondary | ICD-10-CM | POA: Diagnosis not present

## 2018-04-13 DIAGNOSIS — R5382 Chronic fatigue, unspecified: Secondary | ICD-10-CM | POA: Diagnosis not present

## 2018-04-13 DIAGNOSIS — Z13220 Encounter for screening for lipoid disorders: Secondary | ICD-10-CM | POA: Diagnosis not present

## 2018-04-13 MED ORDER — AMPHETAMINE-DEXTROAMPHET ER 20 MG PO CP24: 20.0000 mg | ORAL_CAPSULE | Freq: Every day | ORAL | 0 refills | Status: DC

## 2018-04-13 MED FILL — ADDERALL XR 20 MG CAP SA: 20 | 30 days supply | Qty: 30 | Fill #0

## 2018-04-13 NOTE — Patient Instructions (Signed)
.....Vaccine Information Statement   Tdap (Tetanus, Diphtheria, Pertussis) Vaccine: What You Need to Know  Many Vaccine Information Statements are available in Spanish and other languages. See www.immunize.org/vis. Hojas de Informacin Sobre Vacunas estn disponibles en espaol y en muchos otros idiomas. Visite http://www.immunize.org/vis  1. Why get vaccinated?  Tetanus, diphtheria, and pertussis are very serious diseases. Tdap vaccine can protect us from these diseases.  And, Tdap vaccine given to pregnant women can protect newborn babies against pertussis.  TETANUS (Lockjaw) is rare in the [place] today. It causes painful muscle tightening and stiffness, usually all over the body. . It can lead to tightening of muscles in the head and neck so you can't open your mouth, swallow, or sometimes even breathe. Tetanus kills about 1 out of 10 people who are infected even after receiving the best medical care.    DIPHTHERIA is also rare in the [place] today.  It can cause a thick coating to form in the back of the throat. . It can lead to breathing problems, heart failure, paralysis, and death.  PERTUSSIS (Whooping Cough) causes severe coughing spells, which can cause difficulty breathing, vomiting, and disturbed sleep. . It can also lead to weight loss, incontinence, and rib fractures. Up to 2 in 100 adolescents and 5 in 100 adults with pertussis are hospitalized or have complications, which could include pneumonia or death.   These diseases are caused by bacteria. Diphtheria and pertussis are spread from person to person through secretions from coughing or sneezing. Tetanus enters the body through cuts, scratches, or wounds.  Before vaccines, as many as 200,000 cases of diphtheria, 200,000 cases of pertussis, and hundreds of cases of tetanus, were reported in the [place] each year. Since vaccination began, reports of cases for tetanus and diphtheria have dropped by about 99% and  for pertussis by about 80%.  2. Tdap vaccine  Tdap vaccine can protect adolescents and adults from tetanus, diphtheria, and pertussis. One dose of Tdap is routinely given at age 11 or 12.  People who did not get Tdap at that age should get it as soon as possible.  Tdap is especially important for health care professionals and anyone having close contact with a baby younger than 12 months.    Pregnant women should get a dose of Tdap during every pregnancy, to protect the newborn from pertussis.  Infants are most at risk for severe, life-threatening complications from pertussis.  Another vaccine, called Td, protects against tetanus and diphtheria, but not pertussis. A Td booster should be given every 10 years. Tdap may be given as one of these boosters if you have never gotten Tdap before.  Tdap may also be given after a severe cut or burn to prevent tetanus infection.  Your doctor or the person giving you the vaccine can give you more information.  Tdap may safely be given at the same time as other vaccines.  3. Some people should not get this vaccine  ; A person who has ever had a life-threatening allergic reaction after a previous dose of any diphtheria, tetanus or pertussis containing vaccine, OR has a severe allergy to any part of this vaccine, should not get Tdap vaccine.  Tell the person giving the vaccine about any severe allergies.  ; Anyone who had coma or long repeated seizures within 7 days after a childhood dose of DTP or DTaP, or a previous dose of Tdap, should not get Tdap, unless a cause other than the vaccine was found.    They can still get Td.  ; Talk to your doctor if you: - have seizures or another nervous system problem, - had severe pain or swelling after any vaccine containing diphtheria, tetanus or pertussis,  - ever had a condition called Guillain Barr Syndrome (GBS), - aren't feeling well on the day the shot is scheduled.  4. Risks  With any medicine, including  vaccines, there is a chance of side effects. These are usually mild and go away on their own. Serious reactions are also possible but are rare.   Most people who get Tdap vaccine do not have any problems with it.  Mild Problems following Tdap (Did not interfere with activities) ; Pain where the shot was given (about 3 in 4 adolescents or 2 in 3 adults) ; Redness or swelling where the shot was given (about 1 person in 5) ; Mild fever of at least 100.4F (up to about 1 in 25 adolescents or 1 in 100 adults) ; Headache (about 3 or 4 people in 10) ; Tiredness (about 1 person in 3 or 4) ; Nausea, vomiting, diarrhea, stomach ache (up to 1 in 4 adolescents or 1 in 10 adults) ; Chills,  sore joints (about 1 person in 10) ; Body aches (about 1 person in 3 or 4)  ; Rash, swollen glands (uncommon)  Moderate Problems following Tdap (Interfered with activities, but did not require medical attention) ; Pain where the shot was given (up to 1 in 5 or 6)  ; Redness or swelling where the shot was given (up to about 1 in 16 adolescents or 1 in 12 adults) ; Fever over 102F (about 1 in 100 adolescents or 1 in 250 adults) ; Headache (about 1 in 7 adolescents or 1 in 10 adults) ; Nausea, vomiting, diarrhea, stomach ache (up to 1 or 3 people in 100) ; Swelling of the entire arm where the shot was given (up to about 1 in 500).   Severe Problems following Tdap (Unable to perform usual activities; required medical attention) ; Swelling, severe pain, bleeding, and redness in the arm where the shot was given (rare).  Problems that could happen after any vaccine:  ; People sometimes faint after a medical procedure, including vaccination. Sitting or lying down for about 15 minutes can help prevent fainting, and injuries caused by a fall. Tell your doctor if you feel dizzy, or have vision changes or ringing in the ears.  ; Some people get severe pain in the shoulder and have difficulty moving the arm where a shot  was given. This happens very rarely.  ; Any medication can cause a severe allergic reaction. Such reactions from a vaccine are very rare, estimated at fewer than 1 in a million doses, and would happen within a few minutes to a few hours after the vaccination.   As with any medicine, there is a very remote chance of a vaccine causing a serious injury or death.  The safety of vaccines is always being monitored. For more information, visit: www.cdc.gov/vaccinesafety/  5. What if there is a serious problem?  What should I look for? ; Look for anything that concerns you, such as signs of a severe allergic reaction, very high fever, or unusual behavior.  ; Signs of a severe allergic reaction can include hives, swelling of the face and throat, difficulty breathing, a fast heartbeat, dizziness, and weakness. These would usually start a few minutes to a few hours after the vaccination.  What should I do? ;   If you think it is a severe allergic reaction or other emergency that can't wait, call 9-1-1 or get the person to the nearest hospital. Otherwise, call your doctor.  ; Afterward, the reaction should be reported to the Vaccine Adverse Event Reporting System (VAERS). Your doctor might file this report, or you can do it yourself through the VAERS web site at www.vaers.hhs.gov, or by calling 1-800-822-7967.  VAERS does not give medical advice.  6. The National Vaccine Injury Compensation Program  The National Vaccine Injury Compensation Program (VICP) is a federal program that was created to compensate people who may have been injured by certain vaccines.  Persons who believe they may have been injured by a vaccine can learn about the program and about filing a claim by calling 1-800-338-2382 or visiting the VICP website at www.hrsa.gov/vaccinecompensation. There is a time limit to file a claim for compensation.   7. How can I learn more?  ; Ask your doctor. He or she can give you the vaccine  package insert or suggest other sources of information. ; Call your local or state health department. ; Contact the Centers for Disease Control and Prevention (CDC): - Call 1-800-232-4636 (1-800-CDC-INFO) or - Visit CDC's website at www.cdc.gov/vaccines   Vaccine Information Statement  Tdap Vaccine (03/30/2013) 42 U.S.C.  300aa-26  Department of Health and Human Services Centers for Disease Control and Prevention  Office Use Only  

## 2018-04-14 ENCOUNTER — Encounter: Payer: Self-pay | Admitting: Family Medicine

## 2018-04-14 DIAGNOSIS — F900 Attention-deficit hyperactivity disorder, predominantly inattentive type: Secondary | ICD-10-CM | POA: Insufficient documentation

## 2018-04-14 DIAGNOSIS — R5382 Chronic fatigue, unspecified: Secondary | ICD-10-CM | POA: Insufficient documentation

## 2018-04-14 LAB — COMPREHENSIVE METABOLIC PANEL
ALT: 10 U/L (ref 0–35)
AST: 14 U/L (ref 0–37)
Albumin: 4.3 g/dL (ref 3.5–5.2)
Alkaline Phosphatase: 50 U/L (ref 39–117)
BUN: 19 mg/dL (ref 6–23)
CO2: 27 mEq/L (ref 19–32)
Calcium: 9.2 mg/dL (ref 8.4–10.5)
Chloride: 104 mEq/L (ref 96–112)
Creatinine, Ser: 0.98 mg/dL (ref 0.40–1.20)
GFR: 65.3 mL/min (ref >60.00)
Glucose, Bld: 92 mg/dL (ref 70–99)
Potassium: 4.1 mEq/L (ref 3.5–5.1)
Sodium: 140 mEq/L (ref 135–145)
Total Bilirubin: 0.7 mg/dL (ref 0.2–1.2)
Total Protein: 6.6 g/dL (ref 6.0–8.3)

## 2018-04-14 LAB — CBC WITH DIFFERENTIAL/PLATELET
Basophils Absolute: 0 10*3/uL (ref 0.0–0.1)
Basophils Relative: 0.4 % (ref 0.0–3.0)
Eosinophils Absolute: 0 10*3/uL (ref 0.0–0.7)
Eosinophils Relative: 0.5 % (ref 0.0–5.0)
HCT: 41 % (ref 36.0–46.0)
Hemoglobin: 13.4 g/dL (ref 12.0–15.0)
Lymphocytes Relative: 20.2 % (ref 12.0–46.0)
Lymphs Abs: 1.7 10*3/uL (ref 0.7–4.0)
MCHC: 32.8 g/dL (ref 30.0–36.0)
MCV: 83.9 fl (ref 78.0–100.0)
Monocytes Absolute: 0.6 10*3/uL (ref 0.1–1.0)
Monocytes Relative: 7.3 % (ref 3.0–12.0)
Neutro Abs: 6.1 10*3/uL (ref 1.4–7.7)
Neutrophils Relative %: 71.6 % (ref 43.0–77.0)
Platelets: 260 10*3/uL (ref 150.0–400.0)
RBC: 4.89 Mil/uL (ref 3.87–5.11)
RDW: 13.1 % (ref 11.5–15.5)
WBC: 8.6 10*3/uL (ref 4.0–10.5)

## 2018-04-14 LAB — LIPID PANEL
Cholesterol: 179 mg/dL (ref 0–200)
HDL: 44.2 mg/dL (ref >39.00)
LDL Cholesterol: 106 mg/dL — ABNORMAL HIGH (ref 0–99)
NonHDL: 134.9
Total CHOL/HDL Ratio: 4
Triglycerides: 145 mg/dL (ref 0.0–149.0)
VLDL: 29 mg/dL (ref 0.0–40.0)

## 2018-04-14 LAB — IRON,TIBC AND FERRITIN PANEL
%SAT: 26 % (calc) (ref 16–45)
Ferritin: 30 ng/mL (ref 16–154)
Iron: 84 ug/dL (ref 40–190)
TIBC: 329 mcg/dL (calc) (ref 250–450)

## 2018-04-14 LAB — TSH: TSH: 0.56 u[IU]/mL (ref 0.35–4.50)

## 2018-04-14 LAB — VITAMIN B12: Vitamin B-12: 697 pg/mL (ref 211–911)

## 2018-04-14 LAB — HEMOGLOBIN A1C: Hgb A1c MFr Bld: 5.5 % (ref 4.6–6.5)

## 2018-04-14 LAB — VITAMIN D 25 HYDROXY (VIT D DEFICIENCY, FRACTURES): VITD: 24.12 ng/mL — ABNORMAL LOW (ref 30.00–100.00)

## 2018-04-19 ENCOUNTER — Encounter: Payer: Self-pay | Admitting: Family Medicine

## 2018-04-19 NOTE — Assessment & Plan Note (Signed)
Plan:  1. Goals:  What improvements would you most like to see? Help with focus at work and home.  2. Plans to reach these goals: Treatment with medication. 3. Medication Management:  Stimulant: see orders.  4. Discussion: The benefits of stimulant medication treatment appear to outweigh the current risks. We discussed risks and benefits of different medications. I recommended and discussed appropriate dietary modifications and routine exercise. Stimulant medication management, careful titration, and dose optimization of stimulant medication was discussed as the treatment option with the best scientific evidence helping reduce ADHD symptoms. There is no cure for ADHD. Stimulant medication side effects: The temporary side effects of stimulants including decreased appetite, sleep disturbance, mood changes, personality changes, ticks, increases in heart rate and blood pressure, abdominal pain, nausea, vomiting, and dry mouth were discussed.

## 2018-04-19 NOTE — Assessment & Plan Note (Signed)
Ongoing. The patient is asked to make an attempt to improve diet and exercise patterns to aid in medical management of this problem.

## 2018-04-19 NOTE — Assessment & Plan Note (Signed)
She has recently restarted botox injections. Worked well in the past. We discussed Emgality if botox not as helpful this time. Reviewed the importance of sleep, self care, stress reduction.

## 2018-04-19 NOTE — Assessment & Plan Note (Signed)
Patient has an upcoming appointment with Rheumatology.

## 2018-04-23 ENCOUNTER — Encounter: Payer: Self-pay | Admitting: Family Medicine

## 2018-04-23 MED ORDER — ALPRAZOLAM 0.5 MG PO TABS: 0.5000 mg | ORAL_TABLET | Freq: Every evening | ORAL | 0 refills | Status: DC | PRN

## 2018-04-23 MED FILL — ALPRAZolam 0.5 MG TABS: 0.5 | 30 days supply | Qty: 30 | Fill #0

## 2018-04-23 NOTE — Telephone Encounter (Signed)
Not on med list. Rx pending.   Forwarding to Dr. Juleen China.

## 2018-04-27 ENCOUNTER — Telehealth: Payer: Self-pay | Admitting: Emergency Medicine

## 2018-04-27 MED ORDER — ALPRAZOLAM 0.5 MG PO TABS: 0.5000 mg | ORAL_TABLET | Freq: Every evening | ORAL | 0 refills | Status: DC | PRN

## 2018-04-27 NOTE — Telephone Encounter (Signed)
5 tablets of Xanax were sent to her local pharmacy to hold her over until she can get her script from Dr Juleen China.

## 2018-04-27 NOTE — Telephone Encounter (Signed)
Pt had rx filled by PCP sent to [facility] for short term tx of anxiety.   [facility] may have mailed the prescription. Unable to reach pharmacy after 30 minute hold time.   Request temporary rx to [facility].   Thank you.

## 2018-05-04 ENCOUNTER — Other Ambulatory Visit: Payer: Self-pay | Admitting: Obstetrics and Gynecology

## 2018-05-04 ENCOUNTER — Ambulatory Visit (INDEPENDENT_AMBULATORY_CARE_PROVIDER_SITE_OTHER): Payer: 59 | Admitting: Obstetrics and Gynecology

## 2018-05-04 ENCOUNTER — Encounter: Payer: Self-pay | Admitting: Obstetrics and Gynecology

## 2018-05-04 VITALS — BP 118/72 | HR 76 | Temp 98.6°F | Wt 170.0 lb

## 2018-05-04 DIAGNOSIS — Z3009 Encounter for other general counseling and advice on contraception: Secondary | ICD-10-CM

## 2018-05-04 DIAGNOSIS — Z30432 Encounter for removal of intrauterine contraceptive device: Secondary | ICD-10-CM

## 2018-05-04 MED ORDER — NORETHIN-ETH ESTRAD-FE BIPHAS 1 MG-10 MCG / 10 MCG PO TABS: 1.0000 | ORAL_TABLET | Freq: Every day | ORAL | 2 refills | Status: DC

## 2018-05-04 MED ORDER — NORETHIN ACE-ETH ESTRAD-FE 1-20 MG-MCG PO TABS: 1.0000 | ORAL_TABLET | Freq: Every day | ORAL | 2 refills | Status: DC

## 2018-05-04 MED FILL — NORETHIN-ESTRAD-FERR 1-0.02: 1-20 | 84 days supply | Qty: 84 | Fill #0

## 2018-05-04 NOTE — Patient Instructions (Signed)

## 2018-05-04 NOTE — Progress Notes (Signed)
GYNECOLOGY  VISIT   HPI: 33 y.o.   Married White or Caucasian Not Hispanic or Latino  female   [phone] with No LMP recorded. (Menstrual status: IUD).   here for IUD removal. She c/o odor since the IUD was inserted, worse after intercourse. She has had negative vaginitis testing.  She isn't having regular cycles She has been on OCP's in the past. No contraindications to OCP's.     GYNECOLOGIC HISTORY: No LMP recorded. (Menstrual status: IUD). Contraception:IUD Menopausal hormone therapy: NA        OB History    Gravida  2   Para  2   Term  1   Preterm  0   AB  0   Living  2     SAB  0   TAB  0   Ectopic  0   Multiple  0   Live Births  2              Patient Active Problem List   Diagnosis Date Noted  . Attention deficit hyperactivity disorder (ADHD), predominantly inattentive type 04/14/2018  . Chronic fatigue 04/14/2018  . 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 Sjogren's syndrome ([place]) 08/29/2016  . Chronic migraine without aura without status migrainosus, not intractable 06/10/2015  . Anxiety     Past Medical History:  Diagnosis Date  . ADD (attention deficit disorder with hyperactivity)   . Adverse effect of unspecified general anesthetics, subsequent encounter    difficulty waking up from anesthesia  . Anxiety   . Family history of anesthesia complication    N&V  . Migraines   . PONV (postoperative nausea and vomiting)   . Sjogren's disease [facility])     Past Surgical History:  Procedure Laterality Date  . BREAST ENHANCEMENT SURGERY  2013   Dr. Lenon Curt  . CHOLECYSTECTOMY N/A 03/07/2016   Procedure: LAPAROSCOPIC CHOLECYSTECTOMY;  Surgeon: Coralie Keens, MD;  Location: [facility];  Service: General;  Laterality: N/A;  . HYSTEROSCOPY N/A 10/15/2013   Procedure: HYSTEROSCOPY with removal of IUD;  Surgeon: Lyman Speller, MD;  Location: [facility];  Service: Gynecology;  Laterality: N/A;  . INTRAUTERINE DEVICE (IUD) INSERTION  05/2017    Mirena   . KIDNEY STONE SURGERY  2018  . WISDOM TOOTH EXTRACTION Bilateral 2008    Current Outpatient Medications  Medication Sig Dispense Refill  . ALPRAZolam (XANAX) 0.5 MG tablet Take 1 tablet (0.5 mg total) by mouth at bedtime as needed for anxiety. 30 tablet 0  . ALPRAZolam (XANAX) 0.5 MG tablet Take 1 tablet (0.5 mg total) by mouth at bedtime as needed for anxiety. 5 tablet 0  . amphetamine-dextroamphetamine (ADDERALL XR) 20 MG 24 hr capsule Take 1 capsule (20 mg total) by mouth daily. 30 capsule 0  . cholestyramine (QUESTRAN) 4 GM/DOSE powder Take 4 g by mouth 2 (two) times daily.  12  . eletriptan (RELPAX) 40 MG tablet Take 1 tablet (40 mg total) by mouth as needed for migraine or headache. May repeat in 2 hours if headache persists or recurs. 10 tablet 0  . ketorolac (TORADOL) 10 MG tablet Take 1 tablet (10 mg total) by mouth every 6 (six) hours as needed. Max 40mg  a day. Max 5 days a month. 20 tablet 3  . Lactobacillus-Inulin (PROBIOTIC DIGESTIVE SUPPORT PO) Take by mouth.    . levonorgestrel (MIRENA) 20 MCG/24HR IUD 1 each by Intrauterine route once.    . NONFORMULARY OR COMPOUNDED ITEM Boric acid 600mg  vaginal suppositories.  1pv twice weekly. 24 each 3  . ondansetron (ZOFRAN-ODT) 4 MG disintegrating tablet Take 1 tablet (4 mg total) by mouth every 8 (eight) hours as needed for nausea. 30 tablet 3   No current facility-administered medications for this visit.      ALLERGIES: Penicillins  Family History  Problem Relation Age of Onset  . Sarcoidosis Mother   . Diabetes Mother   . Diabetes Father   . Heart disease Maternal Grandfather   . Diabetes Paternal Grandfather   . Heart disease Paternal Grandfather     Social History   Socioeconomic History  . Marital status: Married    Spouse name: Not on file  . Number of children: 2  . Years of education: Not on file  . Highest education level: Some college, no degree  Occupational History  . Not on file  Social Needs   . Financial resource strain: Not on file  . Food insecurity:    Worry: Not on file    Inability: Not on file  . Transportation needs:    Medical: Not on file    Non-medical: Not on file  Tobacco Use  . Smoking status: Never Smoker  . Smokeless tobacco: Never Used  Substance and Sexual Activity  . Alcohol use: No    Alcohol/week: 0.0 standard drinks  . Drug use: No  . Sexual activity: Yes    Partners: Male    Birth control/protection: I.U.D.    Comment: Mirena placed 05/29/17  Lifestyle  . Physical activity:    Days per week: Not on file    Minutes per session: Not on file  . Stress: Not on file  Relationships  . Social connections:    Talks on phone: Not on file    Gets together: Not on file    Attends religious service: Not on file    Active member of club or organization: Not on file    Attends meetings of clubs or organizations: Not on file    Relationship status: Not on file  . Intimate partner violence:    Fear of current or ex partner: Not on file    Emotionally abused: Not on file    Physically abused: Not on file    Forced sexual activity: Not on file  Other Topics Concern  . Not on file  Social History Narrative   Lives at home with her husband and children   Right handed   Caffeine: none    Review of Systems  All other systems reviewed and are negative.   PHYSICAL EXAMINATION:    BP 118/72   Pulse 76   Temp 98.6 F (37 C)   Wt 170 lb (77.1 kg)   BMI 29.18 kg/m     General appearance: alert, cooperative and appears stated age  Pelvic: External genitalia:  no lesions              Urethra:  normal appearing urethra with no masses, tenderness or lesions              Bartholins and Skenes: normal                 Vagina: normal appearing vagina with normal color and discharge, no lesions              Cervix: no lesions, IUD string just inside the cervix. IUD removed with ringed forceps.  Chaperone was present for exam.  ASSESSMENT Contraception  management, wants IUD out    PLAN IUD  removed Will start OCP's, no contraindications, risks reviewed.    An After Visit Summary was printed and given to the patient.  CC: Dr Sabra Heck

## 2018-05-04 NOTE — Progress Notes (Signed)
Lo Loestrin not covered by insurance, will call in loestrin 1/20

## 2018-05-06 ENCOUNTER — Ambulatory Visit: Payer: 59 | Admitting: Rheumatology

## 2018-05-19 ENCOUNTER — Telehealth: Payer: Self-pay | Admitting: Neurology

## 2018-05-27 ENCOUNTER — Ambulatory Visit: Payer: 59 | Admitting: Neurology

## 2018-06-03 ENCOUNTER — Ambulatory Visit: Payer: 59 | Admitting: Rheumatology

## 2018-06-16 NOTE — Progress Notes (Signed)
Office Visit Note  Patient: Teresa Holden             Date of Birth: Aug 28, 1985           MRN: [id number]             PCP: Briscoe Deutscher, DO Referring: Megan Salon, MD Visit Date: 06/17/2018 Occupation: Environmental education officer  Subjective:  Joint pain and sicca symptoms.   History of Present Illness: Teresa Holden is a 33 y.o. female seen in consultation per request of Dr. Sabra Heck.  According to patient her symptoms started when she was 33 years old with joint pain.  She was also experiencing mild fatigue.  She states she was seen by rheumatologist who did lab work and told her that she has Sjogren's.  She was advised to use eyedrops and Tylenol.  She states after that she saw me and she took Plaquenil but did not notice any benefit from Plaquenil and discontinued.  She continued to have fatigue and joint pain.  She states she has been also experiencing brain fog.  She has a history of migraines and had been seen by neurologist.  She describes joint pain mostly in her hands and feet but has not noticed any swelling.  She states her right knee joint pops off and on but does not cause any discomfort.  She also has neck and trapezius pain which she relates to migraines.  She states she gave birth to her daughter 1-1/2-year ago and she has had lower back pain off and on since then where she had epidural.  Activities of Daily Living:  Patient reports morning stiffness for 30-60 minutes.   Patient Denies nocturnal pain.  Difficulty dressing/grooming: Denies Difficulty climbing stairs: Denies Difficulty getting out of chair: Denies Difficulty using hands for taps, buttons, cutlery, and/or writing: Denies  Review of Systems  Constitutional: Positive for fatigue. Negative for night sweats, weight gain and weight loss.  HENT: Positive for mouth dryness. Negative for mouth sores, trouble swallowing, trouble swallowing and nose dryness.   Eyes: Positive for dryness. Negative for pain, redness,  itching and visual disturbance.  Respiratory: Negative for cough, shortness of breath, wheezing and difficulty breathing.   Cardiovascular: Negative for chest pain, palpitations, hypertension, irregular heartbeat and swelling in legs/feet.  Gastrointestinal: Negative for abdominal pain, blood in stool, constipation and diarrhea.  Endocrine: Negative for increased urination.  Genitourinary: Negative for painful urination, pelvic pain and vaginal dryness.  Musculoskeletal: Positive for arthralgias, joint pain and morning stiffness. Negative for joint swelling, myalgias, muscle weakness, muscle tenderness and myalgias.  Skin: Positive for hair loss. Negative for color change, rash, redness, skin tightness, ulcers and sensitivity to sunlight.  Allergic/Immunologic: Negative for susceptible to infections.  Neurological: Positive for headaches. Negative for dizziness, light-headedness, numbness, memory loss, night sweats and weakness.  Hematological: Negative for bruising/bleeding tendency and swollen glands.  Psychiatric/Behavioral: Negative for depressed mood, confusion and sleep disturbance. The patient is nervous/anxious.     PMFS History:  Patient Active Problem List   Diagnosis Date Noted  . Attention deficit hyperactivity disorder (ADHD), predominantly inattentive type 04/14/2018  . Chronic fatigue 04/14/2018  . 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 Sjogren's syndrome ([place]) 08/29/2016  . Chronic migraine without aura without status migrainosus, not intractable 06/10/2015  . Anxiety     Past Medical History:  Diagnosis Date  . ADD (attention deficit disorder with hyperactivity)   . Adverse effect of unspecified general anesthetics, subsequent encounter    difficulty  waking up from anesthesia  . Anxiety   . Family history of anesthesia complication    N&V  . Migraines   . PONV (postoperative nausea and vomiting)   . Sjogren's disease ([place])     Family History  Problem Relation Age  of Onset  . Sarcoidosis Mother   . Diabetes Mother   . Diabetes Father   . Diabetes Brother   . Healthy Son   . Heart disease Maternal Grandfather   . Diabetes Paternal Grandfather   . Heart disease Paternal Grandfather   . Healthy Daughter    Past Surgical History:  Procedure Laterality Date  . BREAST ENHANCEMENT SURGERY  2013   Dr. Lenon Curt  . CHOLECYSTECTOMY N/A 03/07/2016   Procedure: LAPAROSCOPIC CHOLECYSTECTOMY;  Surgeon: Coralie Keens, MD;  Location: [facility];  Service: General;  Laterality: N/A;  . HYSTEROSCOPY N/A 10/15/2013   Procedure: HYSTEROSCOPY with removal of IUD;  Surgeon: Lyman Speller, MD;  Location: [facility];  Service: Gynecology;  Laterality: N/A;  . INTRAUTERINE DEVICE (IUD) INSERTION  05/2017   Mirena   . WISDOM TOOTH EXTRACTION Bilateral 2008   Social History   Social History Narrative   Lives at home with her husband and children   Right handed   Caffeine: none   Immunization History  Administered Date(s) Administered  . Influenza,inj,Quad PF,6+ Mos 11/18/2012  . Influenza-Unspecified 11/18/2017  . Tdap 04/13/2018     Objective: Vital Signs: BP 113/78 (BP Location: Right Arm, Patient Position: Sitting, Cuff Size: Normal)   Pulse 74   Resp 13   Ht 5\' 4"  (1.626 m)   Wt 175 lb 9.6 oz (79.7 kg)   BMI 30.14 kg/m    Physical Exam Vitals signs and nursing note reviewed.  Constitutional:      Appearance: She is well-developed.  HENT:     Head: Normocephalic and atraumatic.  Eyes:     Conjunctiva/sclera: Conjunctivae normal.  Neck:     Musculoskeletal: Normal range of motion.  Cardiovascular:     Rate and Rhythm: Normal rate and regular rhythm.     Heart sounds: Normal heart sounds.  Pulmonary:     Effort: Pulmonary effort is normal.     Breath sounds: Normal breath sounds.  Abdominal:     General: Bowel sounds are normal.     Palpations: Abdomen is soft.  Lymphadenopathy:     Cervical: No cervical adenopathy.  Skin:     General: Skin is warm and dry.     Capillary Refill: Capillary refill takes less than 2 seconds.  Neurological:     Mental Status: She is alert and oriented to person, place, and time.  Psychiatric:        Behavior: Behavior normal.      Musculoskeletal Exam: C-spine thoracic and lumbar spine good range of motion.  Shoulder joints elbow joints wrist joint MCPs PIPs DIPs been good range of motion.  She is some tenderness over her PIP joints but no synovitis was noted.  Hip joints knee joints ankles MTPs PIPs been good range of motion.  She has some changes in the first MTP joint and her PIP joints of her feet.  No synovitis was noted.  CDAI Exam: CDAI Score: Not documented Patient Global Assessment: Not documented; Provider Global Assessment: Not documented Swollen: Not documented; Tender: Not documented Joint Exam   Not documented   There is currently no information documented on the homunculus. Go to the Rheumatology activity and complete the homunculus joint  exam.  Investigation: No additional findings. Component     Latest Ref Rng & Units 04/13/2018  Iron     40 - 190 mcg/dL 84  TIBC     250 - 450 mcg/dL (calc) 329  %SAT     16 - 45 % (calc) 26  Ferritin     16 - 154 ng/mL 30  TSH     0.35 - 4.50 uIU/mL 0.56  Vitamin B12     211 - 911 pg/mL 697  VITD     30.00 - 100.00 ng/mL 24.12 (L)   Imaging: Xr Foot 2 Views Left  Result Date: 06/17/2018 No MTP PIP or DIP narrowing was noted.  No erosive changes were noted. Impression: Unremarkable x-ray of the foot.  Xr Foot 2 Views Right  Result Date: 06/17/2018 No MTP PIP or DIP narrowing was noted.  No erosive changes were noted. Impression: Unremarkable x-ray of the foot.  Xr Hand 2 View Left  Result Date: 06/17/2018 No MCP PIP or DIP narrowing was noted.  No intercarpal joint space narrowing was noted.  No erosive changes were noted. Impression: Unremarkable x-ray of the hand.  Xr Hand 2 View Right  Result Date:  06/17/2018 No MCP PIP or DIP narrowing was noted.  No intercarpal joint space narrowing was noted.  No erosive changes were noted. Impression: Unremarkable x-ray of the hand.   Recent Labs: Lab Results  Component Value Date   WBC 8.6 04/13/2018   HGB 13.4 04/13/2018   PLT 260.0 04/13/2018   NA 140 04/13/2018   K 4.1 04/13/2018   CL 104 04/13/2018   CO2 27 04/13/2018   GLUCOSE 92 04/13/2018   BUN 19 04/13/2018   CREATININE 0.98 04/13/2018   BILITOT 0.7 04/13/2018   ALKPHOS 50 04/13/2018   AST 14 04/13/2018   ALT 10 04/13/2018   PROT 6.6 04/13/2018   ALBUMIN 4.3 04/13/2018   CALCIUM 9.2 04/13/2018   GFRAA >60 12/06/2016    Speciality Comments: No specialty comments available.  Procedures:  No procedures performed Allergies: Penicillins   Assessment / Plan:     Visit Diagnoses: Sjogren's syndrome, with unspecified organ involvement ([place]) -patient has history of sicca symptoms for many years.  She has tried Plaquenil in the past without adequate results.  She has been using over-the-counter products.  She also has dry skin.  I will obtain following labs.  Plan: Urinalysis, Routine w reflex microscopic, ANA, Anti-scleroderma antibody, RNP Antibody, Anti-Smith antibody, Sjogrens syndrome-A extractable nuclear antibody, Sjogrens syndrome-B extractable nuclear antibody, Anti-DNA antibody, double-stranded, C3 and C4, Beta-2 glycoprotein antibodies, Cardiolipin antibodies, IgG, IgM, IgA, Lupus Anticoagulant Eval w/Reflex, I also discussed that she should get a baseline EKG.  As Sjogren's syndrome can be associated with arrhythmias.  SS-B antibody positive - no autoimmune labs in EPIC - Plan: Sjogrens syndrome-A extractable nuclear antibody, Sjogrens syndrome-B extractable nuclear antibody  Pain in both hands - Plan: XR Hand 2 View Right, XR Hand 2 View Left, x-ray of bilateral hands were unremarkable.  Sedimentation rate, Rheumatoid factor, Cyclic citrul peptide antibody, IgG.  A list of  natural anti-inflammatories was provided.  Pain in both feet - Plan: XR Foot 2 Views Right, XR Foot 2 Views Left.  Patient has ongoing discomfort in her feet.  She has mild osteoarthritic changes on examination.  X-rays were unremarkable.  Hypermobility arthralgia-she has hypermobility in her joints which could be contributing to his arthralgias.  Other fatigue - Plan:  CK, Sedimentation rate, Sedimentation rate, CK  Attention deficit hyperactivity disorder (ADHD), predominantly inattentive type  Chronic migraine without aura without status migrainosus, not intractable.  She also complains of brain fog.  Have advised her to discuss that further with her neurologist.  History of anxiety -patient takes and Xanax on PRN basis.  Orders: Orders Placed This Encounter  Procedures  . XR Hand 2 View Right  . XR Hand 2 View Left  . XR Foot 2 Views Right  . XR Foot 2 Views Left  . Urinalysis, Routine w reflex microscopic  . CK  . Sedimentation rate  . Rheumatoid factor  . Cyclic citrul peptide antibody, IgG  . ANA  . Anti-scleroderma antibody  . RNP Antibody  . Anti-Smith antibody  . Sjogrens syndrome-A extractable nuclear antibody  . Sjogrens syndrome-B extractable nuclear antibody  . Anti-DNA antibody, double-stranded  . C3 and C4  . Beta-2 glycoprotein antibodies  . Cardiolipin antibodies, IgG, IgM, IgA  . Lupus Anticoagulant Eval w/Reflex  . Glucose 6 phosphate dehydrogenase  . Urinalysis, Routine w reflex microscopic  . CK  . Sedimentation rate  . Rheumatoid factor  . ANA  . Anti-scleroderma antibody  . Anti-Smith antibody  . Sjogrens syndrome-A extractable nuclear antibody  . Sjogrens syndrome-B extractable nuclear antibody  . Anti-DNA antibody, double-stranded  . C3 and C4  . Glucose 6 phosphate dehydrogenase  . IgG, IgA, IgM  . Lupus anticoagulant panel  . Beta-2-glycoprotein i abs, IgG/M/A  . RNP Antibodies  . CYCLIC CITRUL PEPTIDE ANTIBODY, IGG/IGA   No orders  of the defined types were placed in this encounter.   Face-to-face time spent with patient was 50 minutes. Greater than 50% of time was spent in counseling and coordination of care.  Follow-Up Instructions: Return for Sjogren's.   Bo Merino, MD  Note - This record has been created using Editor, commissioning.  Chart creation errors have been sought, but may not always  have been located. Such creation errors do not reflect on  the standard of medical care.

## 2018-06-17 ENCOUNTER — Ambulatory Visit: Payer: Self-pay

## 2018-06-17 ENCOUNTER — Ambulatory Visit (INDEPENDENT_AMBULATORY_CARE_PROVIDER_SITE_OTHER): Payer: 59

## 2018-06-17 ENCOUNTER — Other Ambulatory Visit: Payer: Self-pay

## 2018-06-17 ENCOUNTER — Encounter: Payer: Self-pay | Admitting: Rheumatology

## 2018-06-17 ENCOUNTER — Ambulatory Visit: Payer: 59 | Admitting: Rheumatology

## 2018-06-17 ENCOUNTER — Other Ambulatory Visit: Payer: Self-pay | Admitting: *Deleted

## 2018-06-17 VITALS — BP 113/78 | HR 74 | Resp 13 | Ht 64.0 in | Wt 175.6 lb

## 2018-06-17 DIAGNOSIS — R5383 Other fatigue: Secondary | ICD-10-CM

## 2018-06-17 DIAGNOSIS — F900 Attention-deficit hyperactivity disorder, predominantly inattentive type: Secondary | ICD-10-CM | POA: Diagnosis not present

## 2018-06-17 DIAGNOSIS — M79641 Pain in right hand: Secondary | ICD-10-CM

## 2018-06-17 DIAGNOSIS — M79672 Pain in left foot: Secondary | ICD-10-CM

## 2018-06-17 DIAGNOSIS — M2550 Pain in unspecified joint: Secondary | ICD-10-CM

## 2018-06-17 DIAGNOSIS — M79671 Pain in right foot: Secondary | ICD-10-CM

## 2018-06-17 DIAGNOSIS — Z8659 Personal history of other mental and behavioral disorders: Secondary | ICD-10-CM

## 2018-06-17 DIAGNOSIS — G43709 Chronic migraine without aura, not intractable, without status migrainosus: Secondary | ICD-10-CM

## 2018-06-17 DIAGNOSIS — M79642 Pain in left hand: Secondary | ICD-10-CM

## 2018-06-17 DIAGNOSIS — R768 Other specified abnormal immunological findings in serum: Secondary | ICD-10-CM | POA: Diagnosis not present

## 2018-06-17 DIAGNOSIS — M3500 Sicca syndrome, unspecified: Secondary | ICD-10-CM

## 2018-06-17 MED ORDER — ETONOGESTREL-ETHINYL ESTRADIOL 0.12-0.015 MG/24HR VA RING: 1.0000 | VAGINAL_RING | VAGINAL | 2 refills | Status: DC

## 2018-06-17 MED FILL — ETONOGESTREL-ETHINYL ESTRAD: 0.12-0.015 | 84 days supply | Qty: 3 | Fill #0

## 2018-06-17 NOTE — Telephone Encounter (Signed)
Patient requests Rx for Nuvaring.  Currently on LoLoestrin following IUD removal.  Would like Nuvaring to improve consistency. Has had trouble remembering pills.  Last annual 01-12-2018. [facility]

## 2018-06-18 ENCOUNTER — Other Ambulatory Visit: Payer: Self-pay

## 2018-06-18 DIAGNOSIS — R5383 Other fatigue: Secondary | ICD-10-CM | POA: Diagnosis not present

## 2018-06-18 DIAGNOSIS — R768 Other specified abnormal immunological findings in serum: Secondary | ICD-10-CM | POA: Diagnosis not present

## 2018-06-18 DIAGNOSIS — M3500 Sicca syndrome, unspecified: Secondary | ICD-10-CM | POA: Diagnosis not present

## 2018-06-18 NOTE — Progress Notes (Signed)
Patient to have labs done in office from [facility]. All records released for drawing. Encounter closed.

## 2018-06-21 LAB — GLUCOSE 6 PHOSPHATE DEHYDROGENASE
G-6-PD, Quant: 10.4 U/g{Hb} (ref 4.6–13.5)
Hemoglobin: 13.1 g/dL (ref 11.1–15.9)

## 2018-06-21 LAB — ANA: Anti Nuclear Antibody (ANA): POSITIVE — AB

## 2018-06-21 LAB — URINALYSIS, ROUTINE W REFLEX MICROSCOPIC
Bilirubin, UA: NEGATIVE
Glucose, UA: NEGATIVE
Ketones, UA: NEGATIVE
Leukocytes,UA: NEGATIVE
Nitrite, UA: NEGATIVE
Protein,UA: NEGATIVE
RBC, UA: NEGATIVE
Specific Gravity, UA: 1.029 (ref 1.005–1.030)
Urobilinogen, Ur: 0.2 mg/dL (ref 0.2–1.0)
pH, UA: 5.5 (ref 5.0–7.5)

## 2018-06-21 LAB — LUPUS ANTICOAGULANT PANEL
Dilute Viper Venom Time: 36.2 s (ref 0.0–47.0)
PTT Lupus Anticoagulant: 25.3 s (ref 0.0–51.9)

## 2018-06-21 LAB — IGG, IGA, IGM
IgA/Immunoglobulin A, Serum: 220 mg/dL (ref 87–352)
IgG (Immunoglobin G), Serum: 979 mg/dL (ref 586–1602)
IgM (Immunoglobulin M), Srm: 95 mg/dL (ref 26–217)

## 2018-06-21 LAB — BETA-2-GLYCOPROTEIN I ABS, IGG/M/A
Beta-2 Glyco 1 IgA: <9 GPI IgA units (ref 0–25)
Beta-2 Glyco 1 IgM: <9 GPI IgM units (ref 0–32)
Beta-2 Glyco I IgG: <9 GPI IgG units (ref 0–20)

## 2018-06-21 LAB — CYCLIC CITRUL PEPTIDE ANTIBODY, IGG/IGA: Cyclic Citrullin Peptide Ab: 8 units (ref 0–19)

## 2018-06-21 LAB — ANTI-SMITH ANTIBODY: ENA SM Ab Ser-aCnc: <0.2 AI (ref 0.0–0.9)

## 2018-06-21 LAB — SJOGRENS SYNDROME-B EXTRACTABLE NUCLEAR ANTIBODY: ENA SSB (LA) Ab: 5.7 AI — ABNORMAL HIGH (ref 0.0–0.9)

## 2018-06-21 LAB — CK: Total CK: 70 U/L (ref 32–182)

## 2018-06-21 LAB — SJOGRENS SYNDROME-A EXTRACTABLE NUCLEAR ANTIBODY: ENA SSA (RO) Ab: <0.2 AI (ref 0.0–0.9)

## 2018-06-21 LAB — ANTI-SCLERODERMA ANTIBODY: Scleroderma (Scl-70) (ENA) Antibody, IgG: <0.2 AI (ref 0.0–0.9)

## 2018-06-21 LAB — C3 AND C4
Complement C3, Serum: 135 mg/dL (ref 82–167)
Complement C4, Serum: 24 mg/dL (ref 14–44)

## 2018-06-21 LAB — RHEUMATOID FACTOR: Rhuematoid fact SerPl-aCnc: 10.6 IU/mL (ref 0.0–13.9)

## 2018-06-21 LAB — SEDIMENTATION RATE: Sed Rate: 22 mm/hr (ref 0–32)

## 2018-06-21 LAB — RNP ANTIBODIES: ENA RNP Ab: <0.2 AI (ref 0.0–0.9)

## 2018-06-21 LAB — ANTI-DNA ANTIBODY, DOUBLE-STRANDED: dsDNA Ab: <1 IU/mL (ref 0–9)

## 2018-06-25 ENCOUNTER — Encounter: Payer: Self-pay | Admitting: Rheumatology

## 2018-06-25 ENCOUNTER — Ambulatory Visit: Payer: 59 | Admitting: Rheumatology

## 2018-06-25 ENCOUNTER — Other Ambulatory Visit: Payer: Self-pay

## 2018-06-25 VITALS — BP 119/69 | HR 82 | Resp 14 | Ht 63.0 in | Wt 176.2 lb

## 2018-06-25 DIAGNOSIS — Z8659 Personal history of other mental and behavioral disorders: Secondary | ICD-10-CM

## 2018-06-25 DIAGNOSIS — M2550 Pain in unspecified joint: Secondary | ICD-10-CM

## 2018-06-25 DIAGNOSIS — F900 Attention-deficit hyperactivity disorder, predominantly inattentive type: Secondary | ICD-10-CM

## 2018-06-25 DIAGNOSIS — G43709 Chronic migraine without aura, not intractable, without status migrainosus: Secondary | ICD-10-CM | POA: Diagnosis not present

## 2018-06-25 DIAGNOSIS — R768 Other specified abnormal immunological findings in serum: Secondary | ICD-10-CM | POA: Diagnosis not present

## 2018-06-25 DIAGNOSIS — M3500 Sicca syndrome, unspecified: Secondary | ICD-10-CM

## 2018-06-25 MED ORDER — HYDROXYCHLOROQUINE SULFATE 200 MG PO TABS: 200.0000 mg | ORAL_TABLET | Freq: Two times a day (BID) | ORAL | 0 refills | Status: DC

## 2018-06-25 MED FILL — HYDROXYCHLOROQUINE SULFATE: 200 | 90 days supply | Qty: 180 | Fill #0

## 2018-06-25 NOTE — Progress Notes (Signed)
Pharmacy Note  Subjective: Patient presents today to the [facility] to see Dr. Estanislado Pandy.  Patient seen by the pharmacist for counseling on hydroxychloroquine Sjogren's syndrome.  She is naive to therapy.  Objective: CMP     Component Value Date/Time   NA 140 04/13/2018 1605   K 4.1 04/13/2018 1605   CL 104 04/13/2018 1605   CO2 27 04/13/2018 1605   GLUCOSE 92 04/13/2018 1605   BUN 19 04/13/2018 1605   CREATININE 0.98 04/13/2018 1605   CREATININE 0.79 05/23/2015 0939   CALCIUM 9.2 04/13/2018 1605   PROT 6.6 04/13/2018 1605   ALBUMIN 4.3 04/13/2018 1605   AST 14 04/13/2018 1605   ALT 10 04/13/2018 1605   ALKPHOS 50 04/13/2018 1605   BILITOT 0.7 04/13/2018 1605   GFRNONAA >60 12/06/2016 0904   GFRAA >60 12/06/2016 0904    CBC    Component Value Date/Time   WBC 8.6 04/13/2018 1605   RBC 4.89 04/13/2018 1605   HGB 13.1 06/18/2018 1031   HGB 12.9 05/09/2014 1151   HCT 41.0 04/13/2018 1605   PLT 260.0 04/13/2018 1605   MCV 83.9 04/13/2018 1605   MCV 81.3 09/07/2014 1048   MCH 24.1 (L) 12/07/2016 0631   MCHC 32.8 04/13/2018 1605   RDW 13.1 04/13/2018 1605   LYMPHSABS 1.7 04/13/2018 1605   MONOABS 0.6 04/13/2018 1605   EOSABS 0.0 04/13/2018 1605   BASOSABS 0.0 04/13/2018 1605    Assessment/Plan: Patient was counseled on the purpose, proper use, and adverse effects of hydroxychloroquine including nausea/diarrhea, skin rash, headaches, and sun sensitivity.  Discussed importance of annual eye exams while on hydroxychloroquine to monitor to ocular toxicity and discussed importance of frequent laboratory monitoring.  Provided patient with eye exam form for baseline ophthalmologic exam and standing lab instructions.  Provided patient with educational materials on hydroxychloroquine and answered all questions.  Patient consented to hydroxychloroquine.  Will upload consent in the media tab.     Dose will be Plaquenil 200 mg twice daily based on weight of 79.9 kg.    All questions encouraged and answered.  Instructed patient to call with any further questions or concerns.  Mariella Saa, PharmD, [facility] Rheumatology Clinical Pharmacist  06/25/2018 2:25 PM

## 2018-06-25 NOTE — Progress Notes (Signed)
Office Visit Note  Patient: Teresa Holden             Date of Birth: 1985/05/05           MRN: [id number]             PCP: Briscoe Deutscher, DO Referring: Briscoe Deutscher, DO Visit Date: 06/25/2018 Occupation: @GUAROCC @  Subjective:  Follow-up (Bil hand pain, bil foot pain)   History of Present Illness: Teresa Holden is a 33 y.o. female history of Sjogren's syndrome and arthralgias.  According to patient she continues to have dry mouth and dry eyes.  She also complains of dry skin and vaginal dryness.  She states she is been using over-the-counter products.  She has been experiencing increased joint pain in her hands and feet.  She states she is stiff for several minutes in the morning.  She has not noticed visible swelling.  She also has chronic lower back pain which is been bothersome.  She complains of right knee joint discomfort which she relates to an injury in the past.  Activities of Daily Living:  Patient reports morning stiffness for 30 minutes.   Patient Denies nocturnal pain.  Difficulty dressing/grooming: Denies Difficulty climbing stairs: Denies Difficulty getting out of chair: Reports Difficulty using hands for taps, buttons, cutlery, and/or writing: Denies  Review of Systems  Constitutional: Positive for fatigue. Negative for night sweats, weight gain and weight loss.  HENT: Positive for mouth dryness. Negative for mouth sores, trouble swallowing, trouble swallowing and nose dryness.   Eyes: Positive for dryness. Negative for pain, redness and visual disturbance.  Respiratory: Negative for cough, shortness of breath and difficulty breathing.   Cardiovascular: Negative for chest pain, palpitations, hypertension, irregular heartbeat and swelling in legs/feet.  Gastrointestinal: Positive for diarrhea. Negative for blood in stool and constipation.  Endocrine: Negative for increased urination.  Genitourinary: Negative for vaginal dryness.  Musculoskeletal: Positive  for arthralgias, joint pain and morning stiffness. Negative for joint swelling, myalgias, muscle weakness, muscle tenderness and myalgias.  Skin: Negative for color change, rash, hair loss, skin tightness, ulcers and sensitivity to sunlight.  Allergic/Immunologic: Negative for susceptible to infections.  Neurological: Negative for dizziness, memory loss, night sweats and weakness.  Hematological: Negative for swollen glands.  Psychiatric/Behavioral: Negative for depressed mood and sleep disturbance. The patient is nervous/anxious.     PMFS History:  Patient Active Problem List   Diagnosis Date Noted  . Attention deficit hyperactivity disorder (ADHD), predominantly inattentive type 04/14/2018  . Chronic fatigue 04/14/2018  . 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 Sjogren's syndrome ([place]) 08/29/2016  . Chronic migraine without aura without status migrainosus, not intractable 06/10/2015  . Anxiety     Past Medical History:  Diagnosis Date  . ADD (attention deficit disorder with hyperactivity)   . Adverse effect of unspecified general anesthetics, subsequent encounter    difficulty waking up from anesthesia  . Anxiety   . Family history of anesthesia complication    N&V  . Migraines   . PONV (postoperative nausea and vomiting)   . Sjogren's disease ([place])     Family History  Problem Relation Age of Onset  . Sarcoidosis Mother   . Diabetes Mother   . Diabetes Father   . Diabetes Brother   . Healthy Son   . Heart disease Maternal Grandfather   . Diabetes Paternal Grandfather   . Heart disease Paternal Grandfather   . Healthy Daughter    Past Surgical History:  Procedure Laterality Date  .  BREAST ENHANCEMENT SURGERY  2013   Dr. Lenon Curt  . CHOLECYSTECTOMY N/A 03/07/2016   Procedure: LAPAROSCOPIC CHOLECYSTECTOMY;  Surgeon: Coralie Keens, MD;  Location: [facility];  Service: General;  Laterality: N/A;  . HYSTEROSCOPY N/A 10/15/2013   Procedure: HYSTEROSCOPY with  removal of IUD;  Surgeon: Lyman Speller, MD;  Location: [facility];  Service: Gynecology;  Laterality: N/A;  . INTRAUTERINE DEVICE (IUD) INSERTION  05/2017   Mirena   . WISDOM TOOTH EXTRACTION Bilateral 2008   Social History   Social History Narrative   Lives at home with her husband and children   Right handed   Caffeine: none   Immunization History  Administered Date(s) Administered  . Influenza,inj,Quad PF,6+ Mos 11/18/2012  . Influenza-Unspecified 11/18/2017  . Tdap 04/13/2018     Objective: Vital Signs: BP 119/69 (BP Location: Left Arm, Patient Position: Sitting, Cuff Size: Normal)   Pulse 82   Resp 14   Ht 5' 3"  (1.6 m)   Wt 176 lb 3.2 oz (79.9 kg)   BMI 31.21 kg/m    Physical Exam Vitals signs and nursing note reviewed.  Constitutional:      Appearance: She is well-developed.  HENT:     Head: Normocephalic and atraumatic.  Eyes:     Conjunctiva/sclera: Conjunctivae normal.  Neck:     Musculoskeletal: Normal range of motion.  Cardiovascular:     Rate and Rhythm: Normal rate and regular rhythm.     Heart sounds: Normal heart sounds.  Pulmonary:     Effort: Pulmonary effort is normal.     Breath sounds: Normal breath sounds.  Abdominal:     General: Bowel sounds are normal.     Palpations: Abdomen is soft.  Lymphadenopathy:     Cervical: No cervical adenopathy.  Skin:    General: Skin is warm and dry.     Capillary Refill: Capillary refill takes less than 2 seconds.  Neurological:     Mental Status: She is alert and oriented to person, place, and time.  Psychiatric:        Behavior: Behavior normal.      Musculoskeletal Exam: C-spine thoracic and lumbar spine good range of motion.  Shoulder joints elbow joints wrist joint MCPs PIPs DIPs been good range of motion with no synovitis.  Hip joints knee joints ankles MTPs PIPs DIPs with good range of motion with no synovitis.  CDAI Exam: CDAI Score: Not documented Patient Global Assessment: Not  documented; Provider Global Assessment: Not documented Swollen: Not documented; Tender: Not documented Joint Exam   Not documented   There is currently no information documented on the homunculus. Go to the Rheumatology activity and complete the homunculus joint exam.  Investigation: No additional findings.  Imaging: Xr Foot 2 Views Left  Result Date: 06/17/2018 No MTP PIP or DIP narrowing was noted.  No erosive changes were noted. Impression: Unremarkable x-ray of the foot.  Xr Foot 2 Views Right  Result Date: 06/17/2018 No MTP PIP or DIP narrowing was noted.  No erosive changes were noted. Impression: Unremarkable x-ray of the foot.  Xr Hand 2 View Left  Result Date: 06/17/2018 No MCP PIP or DIP narrowing was noted.  No intercarpal joint space narrowing was noted.  No erosive changes were noted. Impression: Unremarkable x-ray of the hand.  Xr Hand 2 View Right  Result Date: 06/17/2018 No MCP PIP or DIP narrowing was noted.  No intercarpal joint space narrowing was noted.  No erosive changes were noted. Impression:  Unremarkable x-ray of the hand.   Recent Labs: Lab Results  Component Value Date   WBC 8.6 04/13/2018   HGB 13.1 06/18/2018   PLT 260.0 04/13/2018   NA 140 04/13/2018   K 4.1 04/13/2018   CL 104 04/13/2018   CO2 27 04/13/2018   GLUCOSE 92 04/13/2018   BUN 19 04/13/2018   CREATININE 0.98 04/13/2018   BILITOT 0.7 04/13/2018   ALKPHOS 50 04/13/2018   AST 14 04/13/2018   ALT 10 04/13/2018   PROT 6.6 04/13/2018   ALBUMIN 4.3 04/13/2018   CALCIUM 9.2 04/13/2018   GFRAA >60 12/06/2016  Jun 18, 2018 UA negative, ESR 22, CK 70, immunoglobulins normal RF negative, anti-CCP negative, G6PD normal, ANA positive, ANA titer not available, SSB positive (SSA negative, Smith negative, dsDNA negative, RNP negative, SCL 70-) C3-C4 normal, beta-2 negative, lupus anticoagulant negative, anticardiolipin not available  Speciality Comments: No specialty comments available.   Procedures:  No procedures performed Allergies: Penicillins   Assessment / Plan:     Visit Diagnoses: Sjogren's syndrome, with unspecified organ involvement ([place]) -patient continues to have sicca symptoms.  She has been using over-the-counter products which are useful to some extent.  Over-the-counter products were discussed again.  We discussed different treatment options including Plaquenil.  Indications side effects contraindications were discussed at length.  Patient was in agreement to proceed with Plaquenil.  It will help her with the arthralgias as well.  The plan is to start her on Plaquenil 200 mg p.o. twice daily.  We will check labs in a month and then every 3 months to monitor for drug toxicity.  Her most recent labs did not give [place] ANA titer and anti-cardiolipin antibodies.  We will obtain those with her next labs.  Plan: hydroxychloroquine (PLAQUENIL) 200 MG tablet  Polyarthralgia - X-ray of bilateral hands and bilateral feet were unremarkable.  She continues to have a lot of discomfort in her hands and feet and significant stiffness.  SS-B antibody positive-I discussed with her that it can be associated with arrhythmias.  Have advised her to get EKG with her PCP.  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  Which could be contributing to her joint pain.  Attention deficit hyperactivity disorder (ADHD), predominantly inattentive type  History of anxiety-situational.  Chronic migraine without aura without status migrainosus, not intractable   Orders: Orders Placed This Encounter  Procedures  . CMP14+EGFR  . ANA w/Reflex  . CBC with Differential/Platelet  . Cardiolipin antibodies, IgG, IgM, IgA   Meds ordered this encounter  Medications  . hydroxychloroquine (PLAQUENIL) 200 MG tablet    Sig: Take 1 tablet (200 mg total) by mouth 2 (two) times daily.    Dispense:  180 tablet    Refill:  0    Face-to-face time spent with patient  was 30 minutes. Greater than 50% of time was spent in counseling and coordination of care.  Follow-Up Instructions: Return in about 3 months (around 09/25/2018) for Sjogren's.   Bo Merino, MD  Note - This record has been created using Editor, commissioning.  Chart creation errors have been sought, but may not always  have been located. Such creation errors do not reflect on  the standard of medical care.

## 2018-06-25 NOTE — Progress Notes (Deleted)
Office Visit Note  Patient: Teresa Holden             Date of Birth: 10/25/1985           MRN: [id number]             PCP: Briscoe Deutscher, DO Visit Date: 06/25/2018   Assessment:        Visit Diagnoses: Sjogren's syndrome, with unspecified organ involvement ([place])  SS-B antibody positive  Hypermobility arthralgia  Attention deficit hyperactivity disorder (ADHD), predominantly inattentive type  History of anxiety  Chronic migraine without aura without status migrainosus, not intractable   Follow-Up Instructions: No follow-ups on file.  Orders: No orders of the defined types were placed in this encounter.  No orders of the defined types were placed in this encounter.    Subjective:    Allergies: Penicillins   Activities of Daily Living: ***   History of Present Illness: Teresa Holden is a 33 y.o. female ***   Review of Systems  Constitutional: Positive for fatigue.  HENT: Positive for mouth dryness.   Eyes: Positive for dryness.  Respiratory: Negative for shortness of breath.   Cardiovascular: Negative for swelling in legs/feet.  Gastrointestinal: Negative for constipation.  Endocrine: Positive for cold intolerance and heat intolerance.  Genitourinary: Negative for difficulty urinating.  Musculoskeletal: Positive for arthralgias, joint pain and morning stiffness.  Skin: Negative for rash.  Allergic/Immunologic: Positive for susceptible to infections.  Neurological: Negative for numbness.  Hematological: Negative for bruising/bleeding tendency.  Psychiatric/Behavioral: Negative for sleep disturbance.     Investigation: No additional findings.   Objective: Vital Signs: BP 119/69 (BP Location: Left Arm, Patient Position: Sitting, Cuff Size: Normal)   Pulse 82   Resp 14   Ht 5\' 3"  (1.6 m)   Wt 176 lb 3.2 oz (79.9 kg)   BMI 31.21 kg/m    Physical Exam   Musculoskeletal Exam: ***  CDAI Exam: CDAI Score: Not documented Patient Global  Assessment: Not documented; Provider Global Assessment: Not documented Swollen: Not documented; Tender: Not documented Joint Exam   Not documented   There is currently no information documented on the homunculus. Go to the Rheumatology activity and complete the homunculus joint exam.  Speciality Comments: No specialty comments available.  Imaging: No results found.   PMFS History:  Patient Active Problem List   Diagnosis Date Noted  . Attention deficit hyperactivity disorder (ADHD), predominantly inattentive type 04/14/2018  . Chronic fatigue 04/14/2018  . 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 Sjogren's syndrome ([place]) 08/29/2016  . Chronic migraine without aura without status migrainosus, not intractable 06/10/2015  . Anxiety     Past Medical History:  Diagnosis Date  . ADD (attention deficit disorder with hyperactivity)   . Adverse effect of unspecified general anesthetics, subsequent encounter    difficulty waking up from anesthesia  . Anxiety   . Family history of anesthesia complication    N&V  . Migraines   . PONV (postoperative nausea and vomiting)   . Sjogren's disease ([place])     Family History  Problem Relation Age of Onset  . Sarcoidosis Mother   . Diabetes Mother   . Diabetes Father   . Diabetes Brother   . Healthy Son   . Heart disease Maternal Grandfather   . Diabetes Paternal Grandfather   . Heart disease Paternal Grandfather   . Healthy Daughter    Past Surgical History:  Procedure Laterality Date  . BREAST ENHANCEMENT SURGERY  2013   Dr.  [place] N/A 03/07/2016   Procedure: LAPAROSCOPIC CHOLECYSTECTOMY;  Surgeon: Coralie Keens, MD;  Location: [facility];  Service: General;  Laterality: N/A;  . HYSTEROSCOPY N/A 10/15/2013   Procedure: HYSTEROSCOPY with removal of IUD;  Surgeon: Lyman Speller, MD;  Location: [facility];  Service: Gynecology;  Laterality: N/A;  . INTRAUTERINE DEVICE (IUD) INSERTION  05/2017   Mirena    . WISDOM TOOTH EXTRACTION Bilateral 2008   Social History   Social History Narrative   Lives at home with her husband and children   Right handed   Caffeine: none     Procedures:  No procedures performed  Gerlean Ren, RT  Note - This record has been created using [employer]. Chart creation errors have been sought, but may not always have been located. Such creation errors do not reflect on the standard of medical care.

## 2018-06-25 NOTE — Patient Instructions (Signed)
Standing Labs We placed an order today for your standing lab work.    Please come back and get your standing labs in 1 month, 3 months, then every 5 months.  We have open lab Monday through Friday from 8:30-11:30 AM and 1:30-4:00 PM  at the office of Dr. Bo Merino.   You may experience shorter wait times on Monday and Friday afternoons. The office is located at [address] [place], [place], [place] [address] No appointment is necessary.   Labs are drawn by [facility].  You may receive a bill from [facility] for your lab work.  If you wish to have your labs drawn at another location, please call the office 24 hours in advance to send orders.  If you have any questions regarding directions or hours of operation,  please call [phone].   Just as a reminder please drink plenty of water prior to coming for your lab work. Thanks!  Hydroxychloroquine tablets What is this medicine? HYDROXYCHLOROQUINE (hye drox ee KLOR oh kwin) is used to treat rheumatoid arthritis and systemic lupus erythematosus. It is also used to treat malaria. This medicine may be used for other purposes; ask your health care provider or pharmacist if you have questions. COMMON BRAND NAME(S): Plaquenil, Quineprox What should I tell my health care provider before I take this medicine? They need to know if you have any of these conditions: -diabetes -eye disease, vision problems -G6PD deficiency -history of blood diseases -history of irregular heartbeat -if you often drink alcohol -kidney disease -liver disease -porphyria -psoriasis -seizures -an unusual or allergic reaction to chloroquine, hydroxychloroquine, other medicines, foods, dyes, or preservatives -pregnant or trying to get pregnant -breast-feeding How should I use this medicine? Take this medicine by mouth with a glass of water. Follow the directions on the prescription label. Avoid taking antacids within 4 hours of taking this medicine. It is  best to separate these medicines by at least 4 hours. Do not cut, crush or chew this medicine. You can take it with or without food. If it upsets your stomach, take it with food. Take your medicine at regular intervals. Do not take your medicine more often than directed. Take all of your medicine as directed even if you think you are better. Do not skip doses or stop your medicine early. Talk to your pediatrician regarding the use of this medicine in children. While this drug may be prescribed for selected conditions, precautions do apply. Overdosage: If you think you have taken too much of this medicine contact a poison control center or emergency room at once. NOTE: This medicine is only for you. Do not share this medicine with others. What if I miss a dose? If you miss a dose, take it as soon as you can. If it is almost time for your next dose, take only that dose. Do not take double or extra doses. What may interact with this medicine? Do not take this medicine with any of the following medications: -cisapride -dofetilide -dronedarone -live virus vaccines -penicillamine -pimozide -thioridazine -ziprasidone This medicine may also interact with the following medications: -ampicillin -antacids -cimetidine -cyclosporine -digoxin -medicines for diabetes, like insulin, glipizide, glyburide -medicines for seizures like carbamazepine, phenobarbital, phenytoin -mefloquine -methotrexate -other medicines that prolong the QT interval (cause an abnormal heart rhythm) -praziquantel This list may not describe all possible interactions. Give your health care provider a list of all the medicines, herbs, non-prescription drugs, or dietary supplements you use. Also tell them if you smoke, drink alcohol,  or use illegal drugs. Some items may interact with your medicine. What should I watch for while using this medicine? Tell your doctor or healthcare professional if your symptoms do not start to get  better or if they get worse. Avoid taking antacids within 4 hours of taking this medicine. It is best to separate these medicines by at least 4 hours. Tell your doctor or health care professional right away if you have any change in your eyesight. Your vision and blood may be tested before and during use of this medicine. This medicine can make you more sensitive to the sun. Keep out of the sun. If you cannot avoid being in the sun, wear protective clothing and use sunscreen. Do not use sun lamps or tanning beds/booths. What side effects may I notice from receiving this medicine? Side effects that you should report to your doctor or health care professional as soon as possible: -allergic reactions like skin rash, itching or hives, swelling of the face, lips, or tongue -changes in vision -decreased hearing or ringing of the ears -redness, blistering, peeling or loosening of the skin, including inside the mouth -seizures -sensitivity to light -signs and symptoms of a dangerous change in heartbeat or heart rhythm like chest pain; dizziness; fast or irregular heartbeat; palpitations; feeling faint or lightheaded, falls; breathing problems -signs and symptoms of liver injury like dark yellow or brown urine; general ill feeling or flu-like symptoms; light-colored stools; loss of appetite; nausea; right upper belly pain; unusually weak or tired; yellowing of the eyes or skin -signs and symptoms of low blood sugar such as feeling anxious; confusion; dizziness; increased hunger; unusually weak or tired; sweating; shakiness; cold; irritable; headache; blurred vision; fast heartbeat; loss of consciousness -uncontrollable head, mouth, neck, arm, or leg movements Side effects that usually do not require medical attention (report to your doctor or health care professional if they continue or are bothersome): -anxious -diarrhea -dizziness -hair loss -headache -irritable -loss of appetite -nausea,  vomiting -stomach pain This list may not describe all possible side effects. Call your doctor for medical advice about side effects. You may report side effects to FDA at 1-800-FDA-1088. Where should I keep my medicine? Keep out of the reach of children. In children, this medicine can cause overdose with small doses. Store at room temperature between 15 and 30 degrees C (59 and 86 degrees F). Protect from moisture and light. Throw away any unused medicine after the expiration date. NOTE: This sheet is a summary. It may not cover all possible information. If you have questions about this medicine, talk to your doctor, pharmacist, or health care provider.  2019 Elsevier/Gold Standard (2015-09-06 14:16:15)

## 2018-07-08 ENCOUNTER — Ambulatory Visit: Payer: Self-pay | Admitting: Rheumatology

## 2018-07-11 NOTE — Progress Notes (Signed)
Virtual Visit via Video   Due to the COVID-19 pandemic, this visit was completed with telemedicine (audio/video) technology to reduce patient and provider exposure as well as to preserve personal protective equipment.   I connected with Teresa Holden by a video enabled telemedicine application and verified that I am speaking with the correct person using two identifiers. Location patient: Home Location provider: [facility], Office Persons participating in the virtual visit: [name], [name], DO [name], CMA acting as scribe for Dr. Briscoe Deutscher.   I discussed the limitations of evaluation and management by telemedicine and the availability of in person appointments. The patient expressed understanding and agreed to proceed.  Care Team   Patient Care Team: Briscoe Deutscher, DO as PCP - General (Family Medicine)  Subjective:   HPI: Patient in for follow up from Rheumatology. They have started on plaquenil. Would like to have EKG done.    ADHD Refill: Since the last visit has the patient had any:  Appetite changes? No Unintentional weight loss? No Is medication working well ? Yes Does patient take drug holidays? Yes  Difficulties falling to sleep or maintaining sleep? No Any anxiety?  No Any cardiac issues (fainting or paliptations)? No Suicidal thoughts? No Changes in health since last visit? No New medications? No Any illicit substance abuse? No Has the patient taken his medication today? Yes   Review of Systems  Constitutional: Negative for chills and fever.  HENT: Negative for ear pain, hearing loss and tinnitus.   Eyes: Negative for blurred vision and double vision.  Respiratory: Negative for cough, shortness of breath and wheezing.   Cardiovascular: Negative for chest pain, palpitations and leg swelling.  Gastrointestinal: Negative for heartburn, nausea and vomiting.  Genitourinary: Negative for dysuria, frequency and urgency.    Musculoskeletal: Negative for falls and myalgias.  Neurological: Negative for dizziness and headaches.  Psychiatric/Behavioral: Negative for depression and suicidal ideas.    Patient Active Problem List   Diagnosis Date Noted  . Attention deficit hyperactivity disorder (ADHD), predominantly inattentive type 04/14/2018  . Chronic fatigue 04/14/2018  . 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 Sjogren's syndrome ([place]) 08/29/2016  . Chronic migraine without aura without status migrainosus, not intractable 06/10/2015  . Anxiety     Social History   Tobacco Use  . Smoking status: Never Smoker  . Smokeless tobacco: Never Used  Substance Use Topics  . Alcohol use: No    Alcohol/week: 0.0 standard drinks    Current Outpatient Medications:  .  ALPRAZolam (XANAX) 0.5 MG tablet, Take 1 tablet (0.5 mg total) by mouth at bedtime as needed for anxiety., Disp: 5 tablet, Rfl: 0 .  amphetamine-dextroamphetamine (ADDERALL XR) 20 MG 24 hr capsule, Take 1 capsule (20 mg total) by mouth daily., Disp: 30 capsule, Rfl: 0 .  cholestyramine (QUESTRAN) 4 GM/DOSE powder, Take 4 g by mouth 2 (two) times daily., Disp: , Rfl: 12 .  eletriptan (RELPAX) 40 MG tablet, Take 1 tablet (40 mg total) by mouth as needed for migraine or headache. May repeat in 2 hours if headache persists or recurs., Disp: 10 tablet, Rfl: 0 .  etonogestrel-ethinyl estradiol (NUVARING) 0.12-0.015 MG/24HR vaginal ring, Place 1 each vaginally every 28 (twenty-eight) days. Insert vaginally and leave in place for 3 consecutive weeks, then remove for 1 week., Disp: 3 each, Rfl: 2 .  hydroxychloroquine (PLAQUENIL) 200 MG tablet, Take 1 tablet (200 mg total) by mouth 2 (two) times daily., Disp: 180 tablet, Rfl: 0 .  ketorolac (TORADOL) 10 MG tablet, Take 1 tablet (10 mg total) by mouth every 6 (six) hours as needed. Max 40mg  a day. Max 5 days a month., Disp: 20 tablet, Rfl: 3 .  Lactobacillus-Inulin (PROBIOTIC DIGESTIVE SUPPORT PO), Take by mouth.,  Disp: , Rfl:  .  NONFORMULARY OR COMPOUNDED ITEM, Boric acid 600mg  vaginal suppositories.  1pv twice weekly., Disp: 24 each, Rfl: 3 .  ondansetron (ZOFRAN-ODT) 4 MG disintegrating tablet, Take 1 tablet (4 mg total) by mouth every 8 (eight) hours as needed for nausea., Disp: 30 tablet, Rfl: 3 .  Cholecalciferol 1.25 MG (50000 UT) TABS, 50,000 units PO qwk for 12 weeks., Disp: 12 tablet, Rfl: 0  Allergies  Allergen Reactions  . Penicillins Rash    Has patient had a PCN reaction causing immediate rash, facial/tongue/throat swelling, SOB or lightheadedness with hypotension: Yes Has patient had a PCN reaction causing severe rash involving mucus membranes or skin necrosis: Yes Has patient had a PCN reaction that required hospitalization: No Has patient had a PCN reaction occurring within the last 10 years: No If all of the above answers are "NO", then may proceed with Cephalosporin use.   Objective:   VITALS: Per patient if applicable, see vitals. GENERAL: Alert and in no acute distress. CARDIOPULMONARY: No increased WOB. Speaking in clear sentences.  PSYCH: Pleasant and cooperative. Speech normal rate and rhythm. Affect is appropriate. Insight and judgement are appropriate. Attention is focused, linear, and appropriate.  NEURO: Oriented as arrived to appointment on time with no prompting.   Depression screen [facility] 2/9 07/13/2018 09/07/2014 06/30/2014  Decreased Interest 0 0 0  Down, Depressed, Hopeless 0 0 0  PHQ - 2 Score 0 0 0  Altered sleeping 0 - -  Tired, decreased energy 3 - -  Change in appetite 0 - -  Feeling bad or failure about yourself  0 - -  Trouble concentrating 2 - -  Moving slowly or fidgety/restless 0 - -  Suicidal thoughts 0 - -  PHQ-9 Score 5 - -  Difficult doing work/chores Not difficult at all - -   Assessment and Plan:   Teresa Holden was seen today for follow-up.  Diagnoses and all orders for this visit:  Attention deficit hyperactivity disorder (ADHD), predominantly  inattentive type Comments: Patient not using Adderall consistently. Will start using daily to see if it helps with focus and brain fog.  Sjogren's syndrome, with unspecified organ involvement ([place]) Comments: Improving on Plaquenil.   Medication management Comments: On Plaquenil. Will screen for QT issues with EKG. Orders: -     EKG 12-Lead; Future  Vitamin D deficiency Comments: Not taking daily. Will call in high dose.  Orders: -     Cholecalciferol 1.25 MG (50000 UT) TABS; 50,000 units PO qwk for 12 weeks.   Marland Kitchen COVID-19 Education: The signs and symptoms of COVID-19 were discussed with the patient and how to seek care for testing if needed. The importance of social distancing was discussed today. . Reviewed expectations re: course of current medical issues. . Discussed self-management of symptoms. . Outlined signs and symptoms indicating need for more acute intervention. . Patient verbalized understanding and all questions were answered. Marland Kitchen Health Maintenance issues including appropriate healthy diet, exercise, and smoking avoidance were discussed with patient. . See orders for this visit as documented in the electronic medical record.  Briscoe Deutscher, DO  Records requested if needed. Time spent: 25 minutes, of which >50% was spent in obtaining information about her symptoms, reviewing her  previous labs, evaluations, and treatments, counseling her about her condition (please see the discussed topics above), and developing a plan to further investigate it; she had a number of questions which I addressed.

## 2018-07-13 ENCOUNTER — Encounter: Payer: Self-pay | Admitting: Family Medicine

## 2018-07-13 ENCOUNTER — Other Ambulatory Visit: Payer: Self-pay

## 2018-07-13 ENCOUNTER — Ambulatory Visit (INDEPENDENT_AMBULATORY_CARE_PROVIDER_SITE_OTHER): Payer: 59 | Admitting: Family Medicine

## 2018-07-13 VITALS — Ht 63.0 in | Wt 176.0 lb

## 2018-07-13 DIAGNOSIS — F900 Attention-deficit hyperactivity disorder, predominantly inattentive type: Secondary | ICD-10-CM | POA: Diagnosis not present

## 2018-07-13 DIAGNOSIS — Z79899 Other long term (current) drug therapy: Secondary | ICD-10-CM | POA: Diagnosis not present

## 2018-07-13 DIAGNOSIS — E559 Vitamin D deficiency, unspecified: Secondary | ICD-10-CM | POA: Diagnosis not present

## 2018-07-13 DIAGNOSIS — M3500 Sicca syndrome, unspecified: Secondary | ICD-10-CM | POA: Diagnosis not present

## 2018-07-13 MED ORDER — CHOLECALCIFEROL 1.25 MG (50000 UT) PO TABS: ORAL_TABLET | ORAL | 0 refills | Status: DC

## 2018-07-13 MED FILL — VITAMIN D3 50000 UNIT CAPS: 1.25 MG | 84 days supply | Qty: 12 | Fill #0

## 2018-07-14 ENCOUNTER — Ambulatory Visit: Payer: 59

## 2018-07-15 ENCOUNTER — Encounter: Payer: Self-pay | Admitting: Family Medicine

## 2018-07-15 ENCOUNTER — Ambulatory Visit: Payer: 59 | Admitting: Rheumatology

## 2018-07-21 ENCOUNTER — Ambulatory Visit (INDEPENDENT_AMBULATORY_CARE_PROVIDER_SITE_OTHER): Payer: 59 | Admitting: Neurology

## 2018-07-21 ENCOUNTER — Other Ambulatory Visit: Payer: Self-pay

## 2018-07-21 VITALS — Temp 97.5°F

## 2018-07-21 DIAGNOSIS — G43709 Chronic migraine without aura, not intractable, without status migrainosus: Secondary | ICD-10-CM

## 2018-07-21 NOTE — Progress Notes (Signed)
Consent Form Botulism Toxin Injection For Chronic Migraine   Interval history 02/18/2018: This is patient's 4th botox. At baseline she has 20 headache days a month and 12 of those are migrainous. Improved to 2 migraine days a month and 8 headache days just after one injection > 50% improvement excellent response.  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  Reviewed orally with patient, additionally signature is on file:  Botulism toxin has been approved by the Federal drug administration for treatment of chronic migraine. Botulism toxin does not cure chronic migraine and it may not be effective in some patients.  The administration of botulism toxin is accomplished by injecting a small amount of toxin into the muscles of the neck and head. Dosage must be titrated for each individual. Any benefits resulting from botulism toxin tend to wear off after 3 months with a repeat injection required if benefit is to be maintained. Injections are usually done every 3-4 months with maximum effect peak achieved by about 2 or 3 weeks. Botulism toxin is expensive and you should be sure of what costs you will incur resulting from the injection.  The side effects of botulism toxin use for chronic migraine may include:   -Transient, and usually mild, facial weakness with facial injections  -Transient, and usually mild, head or neck weakness with head/neck injections  -Reduction or loss of forehead facial animation due to forehead muscle weakness  -Eyelid drooping  -Dry eye  -Pain at the site of injection or bruising at the site of injection  -Double vision  -Potential unknown long term risks  Contraindications: You should not have Botox if you are pregnant, nursing, allergic to albumin, have an infection, skin condition, or muscle weakness at the site of the injection,  or have myasthenia gravis, Lambert-Eaton syndrome, or ALS.  It is also possible that as with any injection, there may be an allergic reaction or no effect from the medication. Reduced effectiveness after repeated injections is sometimes seen and rarely infection at the injection site may occur. All care will be taken to prevent these side effects. If therapy is given over a long time, atrophy and wasting in the muscle injected may occur. Occasionally the patient's become refractory to treatment because they develop antibodies to the toxin. In this event, therapy needs to be modified.  I have read the above information and consent to the administration of botulism toxin.    BOTOX PROCEDURE NOTE FOR MIGRAINE HEADACHE    Contraindications and precautions discussed with patient(above). Aseptic procedure was observed and patient tolerated procedure. Procedure performed by Dr. Georgia Dom  The condition has existed for more than 6 months, and pt does not have a diagnosis of ALS, Myasthenia Gravis or Lambert-Eaton Syndrome.  Risks and benefits of injections discussed and pt agrees to proceed with the procedure.  Written consent obtained  These injections are medically necessary. Pt  receives good benefits from these injections. These injections do not cause sedations or hallucinations which the oral therapies may cause.  Description of procedure:  The patient was placed in a sitting position. The standard protocol was used for Botox as follows, with 5 units of Botox injected at each site:   -Procerus muscle, midline injection  -Corrugator muscle, bilateral injection  -Frontalis muscle, bilateral injection, with 2 sites each side, medial injection was performed in the upper  one third of the frontalis muscle, in the region vertical from the medial inferior edge of the superior orbital rim. The lateral injection was again in the upper one third of the forehead vertically above the lateral limbus of  the cornea, 1.5 cm lateral to the medial injection site.  - Levator Scapulae: 5 units bilaterally  -Temporalis muscle injection, 5 sites, bilaterally. The first injection was 3 cm above the tragus of the ear, second injection site was 1.5 cm to 3 cm up from the first injection site in line with the tragus of the ear. The third injection site was 1.5-3 cm forward between the first 2 injection sites. The fourth injection site was 1.5 cm posterior to the second injection site. 5th site laterally in the temporalis  muscleat the level of the outer canthus.  - Patient feels her clenching is a trigger for headaches. +5 units masseter bilaterally   - Patient feels the migraines are centered around the eyes +5 units bilaterally at the outer canthus in the orbicularis occuli  -Occipitalis muscle injection, 3 sites, bilaterally. The first injection was done one half way between the occipital protuberance and the tip of the mastoid process behind the ear. The second injection site was done lateral and superior to the first, 1 fingerbreadth from the first injection. The third injection site was 1 fingerbreadth superiorly and medially from the first injection site.  -Cervical paraspinal muscle injection, 2 sites, bilateral knee first injection site was 1 cm from the midline of the cervical spine, 3 cm inferior to the lower border of the occipital protuberance. The second injection site was 1.5 cm superiorly and laterally to the first injection site.  -Trapezius muscle injection was performed at 3 sites, bilaterally. The first injection site was in the upper trapezius muscle halfway between the inflection point of the neck, and the acromion. The second injection site was one half way between the acromion and the first injection site. The third injection was done between the first injection site and the inflection point of the neck.   Will return for repeat injection in 3 months.   A 200 unit sof Botox was used,  any Botox not injected was wasted. The patient tolerated the procedure well, there were no complications of the above procedure.

## 2018-07-21 NOTE — Progress Notes (Signed)
Botox- 200 units x 1 vial Lot: [id number] Expiration: 03/2021 NDC: 2174-7159-53  Bacteriostatic 0.9% Sodium Chloride- 70mL total Lot: [id number] Expiration: 11/05/2018 NDC: 7915-0413-64  Dx: B83.779 B/B

## 2018-07-22 ENCOUNTER — Ambulatory Visit: Payer: 59

## 2018-07-28 ENCOUNTER — Telehealth: Payer: Self-pay | Admitting: Neurology

## 2018-07-28 NOTE — Telephone Encounter (Signed)
I called to schedule the patient for her 3 month injection but she did not answer and her VM was full. If she calls back please confirm the date and time. I will also send a mychart. DW

## 2018-08-05 ENCOUNTER — Telehealth: Payer: 59 | Admitting: Obstetrics and Gynecology

## 2018-08-10 ENCOUNTER — Encounter: Payer: Self-pay | Admitting: Family Medicine

## 2018-08-10 ENCOUNTER — Other Ambulatory Visit: Payer: Self-pay | Admitting: Obstetrics and Gynecology

## 2018-08-10 DIAGNOSIS — F900 Attention-deficit hyperactivity disorder, predominantly inattentive type: Secondary | ICD-10-CM

## 2018-08-10 NOTE — Telephone Encounter (Signed)
Added from another open message.  Sorry for the duplicate message, I requested a refill for Xanax under the wrong doctor. Can I also have a refill on that prescription as well?  Thank you so much!   Seth Bake

## 2018-08-10 NOTE — Telephone Encounter (Signed)
Medication refill request: Xanax Last AEX:  01/12/18 Next AEX: nothing scheduled at this time  Last MMG (if hormonal medication request): NA Refill authorized: #5 with 0 RF

## 2018-08-12 MED ORDER — AMPHETAMINE-DEXTROAMPHET ER 20 MG PO CP24: 20.0000 mg | ORAL_CAPSULE | Freq: Every day | ORAL | 0 refills | Status: DC

## 2018-08-12 MED ORDER — ALPRAZOLAM 0.5 MG PO TABS: 0.5000 mg | ORAL_TABLET | Freq: Every evening | ORAL | 0 refills | Status: DC | PRN

## 2018-08-12 MED FILL — ADDERALL XR 20 MG CAP SA: 20 | 30 days supply | Qty: 30 | Fill #0

## 2018-08-12 NOTE — Telephone Encounter (Signed)
Last OV 07/13/18 Last refill Alprazolam 04/26/28 #5/0                 Adderall 04/13/18 #30/0 Next OV not scheduled

## 2018-08-13 ENCOUNTER — Other Ambulatory Visit: Payer: Self-pay | Admitting: Family Medicine

## 2018-08-13 MED FILL — VITAMIN D3 50000 UNIT CAPS: 1.25 MG | 84 days supply | Qty: 12 | Fill #0

## 2018-08-14 MED ORDER — ALPRAZOLAM 0.5 MG PO TABS: 0.5000 mg | ORAL_TABLET | Freq: Every evening | ORAL | 2 refills | Status: DC | PRN

## 2018-08-14 MED FILL — ALPRAZolam 0.5 MG TABS: 0.5 | 30 days supply | Qty: 30 | Fill #0

## 2018-08-14 NOTE — Addendum Note (Signed)
Addended by: Briscoe Deutscher R on: 08/14/2018 02:14 PM   Modules accepted: Orders

## 2018-08-19 NOTE — Telephone Encounter (Signed)
Error. DW

## 2018-09-15 NOTE — Progress Notes (Deleted)
Office Visit Note  Patient: Teresa Holden             Date of Birth: [birth year]           MRN: [id number]             PCP: Briscoe Deutscher, DO Referring: Briscoe Deutscher, DO Visit Date: 09/29/2018 Occupation: @GUAROCC @  Subjective:  No chief complaint on file.   History of Present Illness: Teresa Holden is a 33 y.o. female ***   Activities of Daily Living:  Patient reports morning stiffness for *** {minute/hour:19697}.   Patient {ACTIONS;DENIES/REPORTS:21021675::"Denies"} nocturnal pain.  Difficulty dressing/grooming: {ACTIONS;DENIES/REPORTS:21021675::"Denies"} Difficulty climbing stairs: {ACTIONS;DENIES/REPORTS:21021675::"Denies"} Difficulty getting out of chair: {ACTIONS;DENIES/REPORTS:21021675::"Denies"} Difficulty using hands for taps, buttons, cutlery, and/or writing: {ACTIONS;DENIES/REPORTS:21021675::"Denies"}  No Rheumatology ROS completed.   PMFS History:  Patient Active Problem List   Diagnosis Date Noted  . Attention deficit hyperactivity disorder (ADHD), predominantly inattentive type 04/14/2018  . Chronic fatigue 04/14/2018  . 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 Sjogren's syndrome ([place]) 08/29/2016  . Chronic migraine without aura without status migrainosus, not intractable 06/10/2015  . Anxiety     Past Medical History:  Diagnosis Date  . ADD (attention deficit disorder with hyperactivity)   . Adverse effect of unspecified general anesthetics, subsequent encounter    difficulty waking up from anesthesia  . Anxiety   . Family history of anesthesia complication    N&V  . Migraines   . PONV (postoperative nausea and vomiting)   . Sjogren's disease ([place])     Family History  Problem Relation Age of Onset  . Sarcoidosis Mother   . Diabetes Mother   . Diabetes Father   . Diabetes Brother   . Healthy Son   . Heart disease Maternal Grandfather   . Diabetes Paternal Grandfather   . Heart disease Paternal Grandfather   . Healthy Daughter    Past  Surgical History:  Procedure Laterality Date  . BREAST ENHANCEMENT SURGERY  2013   Dr. Lenon Curt  . CHOLECYSTECTOMY N/A 03/07/2016   Procedure: LAPAROSCOPIC CHOLECYSTECTOMY;  Surgeon: Coralie Keens, MD;  Location: [place];  Service: General;  Laterality: N/A;  . HYSTEROSCOPY N/A 10/15/2013   Procedure: HYSTEROSCOPY with removal of IUD;  Surgeon: Lyman Speller, MD;  Location: [facility];  Service: Gynecology;  Laterality: N/A;  . INTRAUTERINE DEVICE (IUD) INSERTION  05/2017   Mirena   . WISDOM TOOTH EXTRACTION Bilateral 2008   Social History   Social History Narrative   Lives at home with her husband and children   Right handed   Caffeine: none   Immunization History  Administered Date(s) Administered  . Influenza,inj,Quad PF,6+ Mos 11/18/2012  . Influenza-Unspecified 11/18/2017  . Tdap 04/13/2018     Objective: Vital Signs: There were no vitals taken for this visit.   Physical Exam   Musculoskeletal Exam: ***  CDAI Exam: CDAI Score: - Patient Global: -; Provider Global: - Swollen: -; Tender: - Joint Exam   No joint exam has been documented for this visit   There is currently no information documented on the homunculus. Go to the Rheumatology activity and complete the homunculus joint exam.  Investigation: No additional findings.  Imaging: No results found.  Recent Labs: Lab Results  Component Value Date   WBC 8.6 04/13/2018   HGB 13.1 06/18/2018   PLT 260.0 04/13/2018   NA 140 04/13/2018   K 4.1 04/13/2018   CL 104 04/13/2018   CO2 27 04/13/2018   GLUCOSE 92  04/13/2018   BUN 19 04/13/2018   CREATININE 0.98 04/13/2018   BILITOT 0.7 04/13/2018   ALKPHOS 50 04/13/2018   AST 14 04/13/2018   ALT 10 04/13/2018   PROT 6.6 04/13/2018   ALBUMIN 4.3 04/13/2018   CALCIUM 9.2 04/13/2018   GFRAA >60 12/06/2016    Speciality Comments: No specialty comments available.  Procedures:  No procedures performed Allergies: Penicillins    Assessment / Plan:     Visit Diagnoses: No diagnosis found.  Orders: No orders of the defined types were placed in this encounter.  No orders of the defined types were placed in this encounter.   Face-to-face time spent with patient was *** minutes. Greater than 50% of time was spent in counseling and coordination of care.  Follow-Up Instructions: No follow-ups on file.   Ofilia Neas, PA-C  Note - This record has been created using Dragon software.  Chart creation errors have been sought, but may not always  have been located. Such creation errors do not reflect on  the standard of medical care.

## 2018-09-22 ENCOUNTER — Encounter: Payer: Self-pay | Admitting: Family Medicine

## 2018-09-22 ENCOUNTER — Other Ambulatory Visit: Payer: Self-pay

## 2018-09-22 ENCOUNTER — Ambulatory Visit (INDEPENDENT_AMBULATORY_CARE_PROVIDER_SITE_OTHER): Payer: 59 | Admitting: Family Medicine

## 2018-09-22 VITALS — Ht 63.0 in | Wt 170.0 lb

## 2018-09-22 DIAGNOSIS — M25511 Pain in right shoulder: Secondary | ICD-10-CM | POA: Diagnosis not present

## 2018-09-22 MED ORDER — PREDNISONE 5 MG PO TABS: ORAL_TABLET | ORAL | 0 refills | Status: DC

## 2018-09-22 MED ORDER — BACLOFEN 10 MG PO TABS: 10.0000 mg | ORAL_TABLET | Freq: Every evening | ORAL | 0 refills | Status: DC | PRN

## 2018-09-22 NOTE — Progress Notes (Signed)
Virtual Visit via Video   Due to the COVID-19 pandemic, this visit was completed with telemedicine (audio/video) technology to reduce patient and provider exposure as well as to preserve personal protective equipment.   I connected with Teresa Holden by a video enabled telemedicine application and verified that I am speaking with the correct person using two identifiers. Location patient: Home Location provider: [facility], Office Persons participating in the virtual visit: [place], [place], DO [name], CMA acting as scribe for Dr. Briscoe Deutscher.   I discussed the limitations of evaluation and management by telemedicine and the availability of in person appointments. The patient expressed understanding and agreed to proceed.  Care Team   Patient Care Team: Briscoe Deutscher, DO as PCP - General (Family Medicine)  Subjective:   HPI:   "I woke up last week with pain in my right collar bone area. I am still having the pain a week later. Feels deep in area. No pain with movement but painful when picking up something does increase pain. Does not radiate anywhere."  Review of Systems  Constitutional: Negative for chills and fever.  HENT: Negative for hearing loss and tinnitus.   Eyes: Negative for blurred vision and double vision.  Respiratory: Negative for cough and wheezing.   Cardiovascular: Negative for chest pain, palpitations and leg swelling.  Gastrointestinal: Negative for nausea and vomiting.  Genitourinary: Negative for dysuria and urgency.  Musculoskeletal: Negative for myalgias.  Neurological: Negative for dizziness and headaches.  Psychiatric/Behavioral: Negative for depression and suicidal ideas.    Patient Active Problem List   Diagnosis Date Noted  . Attention deficit hyperactivity disorder (ADHD), predominantly inattentive type 04/14/2018  . Chronic fatigue 04/14/2018  . 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 Sjogren's syndrome ([place])  08/29/2016  . Chronic migraine without aura without status migrainosus, not intractable 06/10/2015  . Anxiety     Social History   Tobacco Use  . Smoking status: Never Smoker  . Smokeless tobacco: Never Used  Substance Use Topics  . Alcohol use: No    Alcohol/week: 0.0 standard drinks   Current Outpatient Medications:  .  ALPRAZolam (XANAX) 0.5 MG tablet, Take 1 tablet (0.5 mg total) by mouth at bedtime as needed for anxiety., Disp: 30 tablet, Rfl: 2 .  amphetamine-dextroamphetamine (ADDERALL XR) 20 MG 24 hr capsule, Take 1 capsule (20 mg total) by mouth daily., Disp: 30 capsule, Rfl: 0 .  Cholecalciferol 1.25 MG (50000 UT) TABS, 50,000 units PO qwk for 12 weeks., Disp: 12 tablet, Rfl: 0 .  cholestyramine (QUESTRAN) 4 GM/DOSE powder, Take 4 g by mouth 2 (two) times daily., Disp: , Rfl: 12 .  eletriptan (RELPAX) 40 MG tablet, Take 1 tablet (40 mg total) by mouth as needed for migraine or headache. May repeat in 2 hours if headache persists or recurs., Disp: 10 tablet, Rfl: 0 .  etonogestrel-ethinyl estradiol (NUVARING) 0.12-0.015 MG/24HR vaginal ring, Place 1 each vaginally every 28 (twenty-eight) days. Insert vaginally and leave in place for 3 consecutive weeks, then remove for 1 week., Disp: 3 each, Rfl: 2 .  hydroxychloroquine (PLAQUENIL) 200 MG tablet, Take 1 tablet (200 mg total) by mouth 2 (two) times daily., Disp: 180 tablet, Rfl: 0 .  ketorolac (TORADOL) 10 MG tablet, Take 1 tablet (10 mg total) by mouth every 6 (six) hours as needed. Max 40mg  a day. Max 5 days a month., Disp: 20 tablet, Rfl: 3 .  Lactobacillus-Inulin (PROBIOTIC DIGESTIVE SUPPORT PO), Take by mouth., Disp: ,  Rfl:  .  NONFORMULARY OR COMPOUNDED ITEM, Boric acid 600mg  vaginal suppositories.  1pv twice weekly., Disp: 24 each, Rfl: 3 .  ondansetron (ZOFRAN-ODT) 4 MG disintegrating tablet, Take 1 tablet (4 mg total) by mouth every 8 (eight) hours as needed for nausea., Disp: 30 tablet, Rfl: 3  Allergies  Allergen  Reactions  . Penicillins Rash    Has patient had a PCN reaction causing immediate rash, facial/tongue/throat swelling, SOB or lightheadedness with hypotension: Yes Has patient had a PCN reaction causing severe rash involving mucus membranes or skin necrosis: Yes Has patient had a PCN reaction that required hospitalization: No Has patient had a PCN reaction occurring within the last 10 years: No If all of the above answers are "NO", then may proceed with Cephalosporin use.    Objective:   VITALS: Per patient if applicable, see vitals. GENERAL: Alert, appears well and in no acute distress. HEENT: Atraumatic, conjunctiva clear, no obvious abnormalities on inspection of external nose and ears. NECK: Normal movements of the head and neck. CARDIOPULMONARY: No increased WOB. Speaking in clear sentences. I:E ratio WNL.  MS: Moves all visible extremities, wincing with ROM shoulder. PSYCH: Pleasant and cooperative, well-groomed. Speech normal rate and rhythm. Affect is appropriate. Insight and judgement are appropriate. Attention is focused, linear, and appropriate.  NEURO: CN grossly intact. Oriented as arrived to appointment on time with no prompting. Moves both UE equally.  SKIN: No obvious lesions, wounds, erythema, or cyanosis noted on face or hands.  Depression screen [facility] 2/9 07/13/2018 09/07/2014 06/30/2014  Decreased Interest 0 0 0  Down, Depressed, Hopeless 0 0 0  PHQ - 2 Score 0 0 0  Altered sleeping 0 - -  Tired, decreased energy 3 - -  Change in appetite 0 - -  Feeling bad or failure about yourself  0 - -  Trouble concentrating 2 - -  Moving slowly or fidgety/restless 0 - -  Suicidal thoughts 0 - -  PHQ-9 Score 5 - -  Difficult doing work/chores Not difficult at all - -   Assessment and Plan:   Teresa Holden was seen today for shoulder pain.  Diagnoses and all orders for this visit:  Acute pain of right shoulder Comments: Likely due to sleeping position with daughter in the bed.  Discussed stretches, NSAIDs, ice. Okay baclofen and prednisone.   Other orders -     predniSONE (DELTASONE) 5 MG tablet; 6-5-4-3-2-1-off -     baclofen (LIORESAL) 10 MG tablet; Take 1 tablet (10 mg total) by mouth at bedtime as needed for muscle spasms.    Marland Kitchen COVID-19 Education: The signs and symptoms of COVID-19 were discussed with the patient and how to seek care for testing if needed. The importance of social distancing was discussed today. . Reviewed expectations re: course of current medical issues. . Discussed self-management of symptoms. . Outlined signs and symptoms indicating need for more acute intervention. . Patient verbalized understanding and all questions were answered. Marland Kitchen Health Maintenance issues including appropriate healthy diet, exercise, and smoking avoidance were discussed with patient. . See orders for this visit as documented in the electronic medical record.  Briscoe Deutscher, DO  Records requested if needed. Time spent: 15 minutes, of which >50% was spent in obtaining information about her symptoms, reviewing her previous labs, evaluations, and treatments, counseling her about her condition (please see the discussed topics above), and developing a plan to further investigate it; she had a number of questions which I addressed.

## 2018-09-26 ENCOUNTER — Encounter: Payer: Self-pay | Admitting: Family Medicine

## 2018-09-29 ENCOUNTER — Ambulatory Visit: Payer: 59 | Admitting: Rheumatology

## 2018-09-30 ENCOUNTER — Ambulatory Visit: Payer: 59 | Admitting: Family Medicine

## 2018-10-13 NOTE — Progress Notes (Deleted)
Office Visit Note  Patient: Teresa Holden             Date of Birth: 03/20/1985           MRN: [id number]             PCP: Briscoe Deutscher, DO Referring: Briscoe Deutscher, DO Visit Date: 10/27/2018 Occupation: @GUAROCC @  Subjective:  No chief complaint on file.   History of Present Illness: Teresa Holden is a 33 y.o. female ***   Activities of Daily Living:  Patient reports morning stiffness for *** {minute/hour:19697}.   Patient {ACTIONS;DENIES/REPORTS:21021675::"Denies"} nocturnal pain.  Difficulty dressing/grooming: {ACTIONS;DENIES/REPORTS:21021675::"Denies"} Difficulty climbing stairs: {ACTIONS;DENIES/REPORTS:21021675::"Denies"} Difficulty getting out of chair: {ACTIONS;DENIES/REPORTS:21021675::"Denies"} Difficulty using hands for taps, buttons, cutlery, and/or writing: {ACTIONS;DENIES/REPORTS:21021675::"Denies"}  No Rheumatology ROS completed.   PMFS History:  Patient Active Problem List   Diagnosis Date Noted  . Attention deficit hyperactivity disorder (ADHD), predominantly inattentive type 04/14/2018  . Chronic fatigue 04/14/2018  . 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 Sjogren's syndrome ([place]) 08/29/2016  . Chronic migraine without aura without status migrainosus, not intractable 06/10/2015  . Anxiety     Past Medical History:  Diagnosis Date  . ADD (attention deficit disorder with hyperactivity)   . Adverse effect of unspecified general anesthetics, subsequent encounter    difficulty waking up from anesthesia  . Anxiety   . Family history of anesthesia complication    N&V  . Migraines   . PONV (postoperative nausea and vomiting)   . Sjogren's disease ([place])     Family History  Problem Relation Age of Onset  . Sarcoidosis Mother   . Diabetes Mother   . Diabetes Father   . Diabetes Brother   . Healthy Son   . Heart disease Maternal Grandfather   . Diabetes Paternal Grandfather   . Heart disease Paternal Grandfather   . Healthy Daughter    Past  Surgical History:  Procedure Laterality Date  . BREAST ENHANCEMENT SURGERY  2013   Dr. Lenon Curt  . CHOLECYSTECTOMY N/A 03/07/2016   Procedure: LAPAROSCOPIC CHOLECYSTECTOMY;  Surgeon: Coralie Keens, MD;  Location: [facility];  Service: General;  Laterality: N/A;  . HYSTEROSCOPY N/A 10/15/2013   Procedure: HYSTEROSCOPY with removal of IUD;  Surgeon: Lyman Speller, MD;  Location: [facility];  Service: Gynecology;  Laterality: N/A;  . INTRAUTERINE DEVICE (IUD) INSERTION  05/2017   Mirena   . WISDOM TOOTH EXTRACTION Bilateral 2008   Social History   Social History Narrative   Lives at home with her husband and children   Right handed   Caffeine: none   Immunization History  Administered Date(s) Administered  . Influenza,inj,Quad PF,6+ Mos 11/18/2012  . Influenza-Unspecified 11/18/2017  . Tdap 04/13/2018     Objective: Vital Signs: There were no vitals taken for this visit.   Physical Exam   Musculoskeletal Exam: ***  CDAI Exam: CDAI Score: - Patient Global: -; Provider Global: - Swollen: -; Tender: - Joint Exam   No joint exam has been documented for this visit   There is currently no information documented on the homunculus. Go to the Rheumatology activity and complete the homunculus joint exam.  Investigation: No additional findings.  Imaging: No results found.  Recent Labs: Lab Results  Component Value Date   WBC 8.6 04/13/2018   HGB 13.1 06/18/2018   PLT 260.0 04/13/2018   NA 140 04/13/2018   K 4.1 04/13/2018   CL 104 04/13/2018   CO2 27 04/13/2018   GLUCOSE 92  04/13/2018   BUN 19 04/13/2018   CREATININE 0.98 04/13/2018   BILITOT 0.7 04/13/2018   ALKPHOS 50 04/13/2018   AST 14 04/13/2018   ALT 10 04/13/2018   PROT 6.6 04/13/2018   ALBUMIN 4.3 04/13/2018   CALCIUM 9.2 04/13/2018   GFRAA >60 12/06/2016    Speciality Comments: No specialty comments available.  Procedures:  No procedures performed Allergies: Penicillins    Assessment / Plan:     Visit Diagnoses: No diagnosis found.  Orders: No orders of the defined types were placed in this encounter.  No orders of the defined types were placed in this encounter.   Face-to-face time spent with patient was *** minutes. Greater than 50% of time was spent in counseling and coordination of care.  Follow-Up Instructions: No follow-ups on file.   Earnestine Mealing, CMA  Note - This record has been created using Editor, commissioning.  Chart creation errors have been sought, but may not always  have been located. Such creation errors do not reflect on  the standard of medical care.

## 2018-10-19 ENCOUNTER — Ambulatory Visit (INDEPENDENT_AMBULATORY_CARE_PROVIDER_SITE_OTHER): Payer: 59 | Admitting: Physician Assistant

## 2018-10-19 ENCOUNTER — Encounter: Payer: Self-pay | Admitting: Physician Assistant

## 2018-10-19 VITALS — Temp 98.5°F | Ht 63.0 in | Wt 170.0 lb

## 2018-10-19 DIAGNOSIS — H0289 Other specified disorders of eyelid: Secondary | ICD-10-CM | POA: Diagnosis not present

## 2018-10-19 MED ORDER — ERYTHROMYCIN 5 MG/GM OP OINT: TOPICAL_OINTMENT | OPHTHALMIC | 0 refills | Status: DC

## 2018-10-19 NOTE — Progress Notes (Signed)
Virtual Visit via Video   I connected with Teresa Holden on 10/19/18 at  9:20 AM EDT by a video enabled telemedicine application and verified that I am speaking with the correct person using two identifiers. Location patient: Home Location provider: [facility], Office Persons participating in the virtual visit: Marleny, Laro PA-C.  I discussed the limitations of evaluation and management by telemedicine and the availability of in person appointments. The patient expressed understanding and agreed to proceed.  I acted as a Education administrator for [employer], PA-C [employer], LPN  Subjective:   HPI:   Eye problem Pt c/o R bottom lid swelling and itching started Friday. Developed pain last night. Tried Lysine tablets no relief. Denies drainage, cough, chills or fever. She has trialed warm compresses. Pt said earlier last week same thing Lt eye slight red and itching but only last 3 days and went away.  Denies sick contacts with any URI symptoms or pink eye. Denies any changes in vision, changes in makeup/facewash.  ROS: See pertinent positives and negatives per HPI.  Patient Active Problem List   Diagnosis Date Noted  . Attention deficit hyperactivity disorder (ADHD), predominantly inattentive type 04/14/2018  . Chronic fatigue 04/14/2018  . 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 Sjogren's syndrome ([place]) 08/29/2016  . Chronic migraine without aura without status migrainosus, not intractable 06/10/2015  . Anxiety     Social History   Tobacco Use  . Smoking status: Never Smoker  . Smokeless tobacco: Never Used  Substance Use Topics  . Alcohol use: No    Alcohol/week: 0.0 standard drinks    Current Outpatient Medications:  .  ALPRAZolam (XANAX) 0.5 MG tablet, Take 1 tablet (0.5 mg total) by mouth at bedtime as needed for anxiety., Disp: 30 tablet, Rfl: 2 .  amphetamine-dextroamphetamine (ADDERALL XR) 20 MG 24 hr capsule, Take 1 capsule (20 mg total) by  mouth daily., Disp: 30 capsule, Rfl: 0 .  baclofen (LIORESAL) 10 MG tablet, Take 1 tablet (10 mg total) by mouth at bedtime as needed for muscle spasms., Disp: 12 each, Rfl: 0 .  Cholecalciferol 1.25 MG (50000 UT) TABS, 50,000 units PO qwk for 12 weeks., Disp: 12 tablet, Rfl: 0 .  eletriptan (RELPAX) 40 MG tablet, Take 1 tablet (40 mg total) by mouth as needed for migraine or headache. May repeat in 2 hours if headache persists or recurs., Disp: 10 tablet, Rfl: 0 .  hydroxychloroquine (PLAQUENIL) 200 MG tablet, Take 1 tablet (200 mg total) by mouth 2 (two) times daily., Disp: 180 tablet, Rfl: 0 .  ketorolac (TORADOL) 10 MG tablet, Take 1 tablet (10 mg total) by mouth every 6 (six) hours as needed. Max 40mg  a day. Max 5 days a month., Disp: 20 tablet, Rfl: 3 .  Lactobacillus-Inulin (PROBIOTIC DIGESTIVE SUPPORT PO), Take by mouth., Disp: , Rfl:  .  ondansetron (ZOFRAN-ODT) 4 MG disintegrating tablet, Take 1 tablet (4 mg total) by mouth every 8 (eight) hours as needed for nausea., Disp: 30 tablet, Rfl: 3 .  cholestyramine (QUESTRAN) 4 GM/DOSE powder, Take 4 g by mouth 2 (two) times daily., Disp: , Rfl: 12 .  erythromycin ophthalmic ointment, Apply thin ribbon to affected eye daily, Disp: 3.5 g, Rfl: 0  Allergies  Allergen Reactions  . Penicillins Rash    Has patient had a PCN reaction causing immediate rash, facial/tongue/throat swelling, SOB or lightheadedness with hypotension: Yes Has patient had a PCN reaction causing severe rash involving mucus membranes or skin  necrosis: Yes Has patient had a PCN reaction that required hospitalization: No Has patient had a PCN reaction occurring within the last 10 years: No If all of the above answers are "NO", then may proceed with Cephalosporin use.    Objective:   VITALS: Per patient if applicable, see vitals. GENERAL: Alert, appears well and in no acute distress. HEENT: Atraumatic, conjunctiva clear, no obvious abnormalities on inspection of external  nose and ears. *small area of swelling and erythema to lower R lid NECK: Normal movements of the head and neck. CARDIOPULMONARY: No increased WOB. Speaking in clear sentences. I:E ratio WNL.  MS: Moves all visible extremities without noticeable abnormality. PSYCH: Pleasant and cooperative, well-groomed. Speech normal rate and rhythm. Affect is appropriate. Insight and judgement are appropriate. Attention is focused, linear, and appropriate.  NEURO: CN grossly intact. Oriented as arrived to appointment on time with no prompting. Moves both UE equally.  SKIN: No obvious lesions, wounds, erythema, or cyanosis noted on face or hands.  Assessment and Plan:   Paizlie was seen today for eye problem.  Diagnoses and all orders for this visit:  Pain of right eyelid  Other orders -     erythromycin ophthalmic ointment; Apply thin ribbon to affected eye daily   No red flags.  Difficult to assess virtually, however do feel like maybe there is a component of blepharitis.  Cannot also rule out stye or early infection.  Recommended she start using erythromycin eye ointment.  Continue warm compresses.  If no improvement or worsening of symptoms I recommended that she call her eye doctor for further evaluation and treatment.  She was agreeable to plan.  . Reviewed expectations re: course of current medical issues. . Discussed self-management of symptoms. . Outlined signs and symptoms indicating need for more acute intervention. . Patient verbalized understanding and all questions were answered. Marland Kitchen Health Maintenance issues including appropriate healthy diet, exercise, and smoking avoidance were discussed with patient. . See orders for this visit as documented in the electronic medical record.  I discussed the assessment and treatment plan with the patient. The patient was provided an opportunity to ask questions and all were answered. The patient agreed with the plan and demonstrated an understanding of  the instructions.   The patient was advised to call back or seek an in-person evaluation if the symptoms worsen or if the condition fails to improve as anticipated.   CMA or LPN served as scribe during this visit. History, Physical, and Plan performed by medical provider. The above documentation has been reviewed and is accurate and complete.   [place], [place] 10/19/2018

## 2018-10-20 ENCOUNTER — Ambulatory Visit: Payer: 59 | Admitting: Neurology

## 2018-10-21 ENCOUNTER — Other Ambulatory Visit: Payer: Self-pay

## 2018-10-21 DIAGNOSIS — F900 Attention-deficit hyperactivity disorder, predominantly inattentive type: Secondary | ICD-10-CM

## 2018-10-24 MED ORDER — AMPHETAMINE-DEXTROAMPHET ER 20 MG PO CP24: 20.0000 mg | ORAL_CAPSULE | Freq: Every day | ORAL | 0 refills | Status: DC

## 2018-10-26 MED FILL — ADDERALL XR 20 MG CAP SA: 20 | 30 days supply | Qty: 30 | Fill #0

## 2018-10-27 ENCOUNTER — Ambulatory Visit: Payer: 59 | Admitting: Rheumatology

## 2018-10-27 NOTE — Progress Notes (Signed)
 Consent Form Botulism Toxin Injection For Chronic Migraine    Reviewed orally with patient, additionally signature is on file:  Botulism toxin has been approved by the Federal drug administration for treatment of chronic migraine. Botulism toxin does not cure chronic migraine and it may not be effective in some patients.  The administration of botulism toxin is accomplished by injecting a small amount of toxin into the muscles of the neck and head. Dosage must be titrated for each individual. Any benefits resulting from botulism toxin tend to wear off after 3 months with a repeat injection required if benefit is to be maintained. Injections are usually done every 3-4 months with maximum effect peak achieved by about 2 or 3 weeks. Botulism toxin is expensive and you should be sure of what costs you will incur resulting from the injection.  The side effects of botulism toxin use for chronic migraine may include:   -Transient, and usually mild, facial weakness with facial injections  -Transient, and usually mild, head or neck weakness with head/neck injections  -Reduction or loss of forehead facial animation due to forehead muscle weakness  -Eyelid drooping  -Dry eye  -Pain at the site of injection or bruising at the site of injection  -Double vision  -Potential unknown long term risks   Contraindications: You should not have Botox if you are pregnant, nursing, allergic to albumin, have an infection, skin condition, or muscle weakness at the site of the injection, or have myasthenia gravis, Lambert-Eaton syndrome, or ALS.  It is also possible that as with any injection, there may be an allergic reaction or no effect from the medication. Reduced effectiveness after repeated injections is sometimes seen and rarely infection at the injection site may occur. All care will be taken to prevent these side effects. If therapy is given over a long time, atrophy and wasting in the muscle injected may  occur. Occasionally the patient's become refractory to treatment because they develop antibodies to the toxin. In this event, therapy needs to be modified.  I have read the above information and consent to the administration of botulism toxin.    BOTOX PROCEDURE NOTE FOR MIGRAINE HEADACHE  Contraindications and precautions discussed with patient(above). Aseptic procedure was observed and patient tolerated procedure. Procedure performed by Dr. Toni Ahern  The condition has existed for more than 6 months, and pt does not have a diagnosis of ALS, Myasthenia Gravis or Lambert-Eaton Syndrome.  Risks and benefits of injections discussed and pt agrees to proceed with the procedure.  Written consent obtained  These injections are medically necessary. Pt  receives good benefits from these injections. These injections do not cause sedations or hallucinations which the oral therapies may cause.   Description of procedure:  The patient was placed in a sitting position. The standard protocol was used for Botox as follows, with 5 units of Botox injected at each site:  -Procerus muscle, midline injection  -Corrugator muscle, bilateral injection  -Frontalis muscle, bilateral injection, with 2 sites each side, medial injection was performed in the upper one third of the frontalis muscle, in the region vertical from the medial inferior edge of the superior orbital rim. The lateral injection was again in the upper one third of the forehead vertically above the lateral limbus of the cornea, 1.5 cm lateral to the medial injection site.  -Temporalis muscle injection, 4 sites, bilaterally. The first injection was 3 cm above the tragus of the ear, second injection site was 1.5 cm to 3   cm up from the first injection site in line with the tragus of the ear. The third injection site was 1.5-3 cm forward between the first 2 injection sites. The fourth injection site was 1.5 cm posterior to the second injection site.  5th site laterally in the temporalis  muscleat the level of the outer canthus.  -Occipitalis muscle injection, 3 sites, bilaterally. The first injection was done one half way between the occipital protuberance and the tip of the mastoid process behind the ear. The second injection site was done lateral and superior to the first, 1 fingerbreadth from the first injection. The third injection site was 1 fingerbreadth superiorly and medially from the first injection site.  -Cervical paraspinal muscle injection, 2 sites, bilateral knee first injection site was 1 cm from the midline of the cervical spine, 3 cm inferior to the lower border of the occipital protuberance. The second injection site was 1.5 cm superiorly and laterally to the first injection site.  -Trapezius muscle injection was performed at 3 sites, bilaterally. The first injection site was in the upper trapezius muscle halfway between the inflection point of the neck, and the acromion. The second injection site was one half way between the acromion and the first injection site. The third injection was done between the first injection site and the inflection point of the neck.   Will return for repeat injection in 3 months.   A 155 units of Botox was used, any Botox not injected was wasted. The patient tolerated the procedure well, there were no complications of the above procedure.  

## 2018-10-27 NOTE — Progress Notes (Deleted)
PATIENT: Teresa Holden DOB: Oct 13, 1985  REASON FOR VISIT: follow up HISTORY FROM: patient  No chief complaint on file.    HISTORY OF PRESENT ILLNESS: Today 10/27/18 Teresa Holden is a 33 y.o. female here today for follow up of migraines on Botox therapy. Last procedure on 6/16. She was seen 9/21 by PCP for concerns of right lower lid swelling and itching. She had taken Lysine with no improvement. Earlier last week she noticed similar symptoms of left eye but they resolved without treatment.   HISTORY: (copied from Dr Cathren Laine note on 10/08/2017)  Interval history: Patient is here for follow-up of chronic migraines.  She has chronic intractable migraines and in the past had Botox injections which she did exceptionally well with unfortunately she became pregnant and had to stop.  The migraine injection worked so well and gave her greater than 50% decrease in frequency and severity.  Patient has 20 headache days a month and 12 of those are migrainous.  Migraines are unilateral, behind the eyes, pulsating pounding and throbbing, with light and sound sensitivity, nausea and vomiting, they are severe and it hurts her to even move.  No aura.  No medication overuse.  Migraines can last 24 to 72 hours.  Ongoing at this frequency for more than 9 months.  She is failed multiple medications.  Discussed restarting Botox.  Would not start her on CGRP as she is thinking about having another child.  No aura.  No medication overuse.  She is failed multiple classes of medications after good trial of at least 6 months on each medication.  HPI:  Teresa Holden is a 33 y.o. female here as a referral from Dr. Talbert Nan. Sharee Pimple MD or migraines. PMhx migraines, Sjogren's syndrome, Anxiety.  Symptoms started 10 years ago. [facility] and was placed on topamax with good results. She stopped it on her own when the symptoms improved. Headaches are worse with stress otherwise no known  triggers, no known inciting events, no trauma. She was restarted on 25mg  of Topamax and she felt crummy and stopped it. She takes imitrex and feels hot and dizzy. She tried maxalt, relpax. Migraines 3-4x a week. She had one for 4 days last week. On the left side, pouding, throbbing, +light sensitivity, +Loud noises bother her, she has to lay down, +nausea but no vomiting. Has tension in the shoulders. She tried lexapro in the past, naproxen, toradol, zofran, phenergan, zanaflex. She has been having migraines at this frequency for over 6 months. She has 20 headache days a month with at least 10 migrainous. Some mornings she wakes up with the headaches. The migraines last 24 hours or longer. They can be severe 10 out of 10 in pain. No vision changes, no dysarthria, no dysphagia, no neck pain, no other focal neurologic symptoms or deficits.No medication overuse. No family history of migraines. Mother with history of sarcoidosis. Toradol shots in the past that helped the best for acute management of migraines. No aura.  Tried: Topiramate, Imitrex (felt hot and dizzy), Maxalt, Relpax, Lexapro, naproxen, Toradol, Zofran, Phenergan, Zanaflex.   Reviewed notes, labs and imaging from outside physicians, which showed:   CT of the head 03/14/2011 showed No acute intracranial abnormalities including mass lesion or mass effect, hydrocephalus, extra-axial fluid collection, midline shift, hemorrhage, or acute infarction, large ischemic events (personally reviewed images) CBC and CMP unremarkable, creatinine 0.79. TSH 1.12 within normal limits.   REVIEW OF SYSTEMS: Out of a  complete 14 system review of symptoms, the patient complains only of the following symptoms, and all other reviewed systems are negative.  ALLERGIES: Allergies  Allergen Reactions  . Penicillins Rash    Has patient had a PCN reaction causing immediate rash, facial/tongue/throat swelling, SOB or lightheadedness with hypotension: Yes Has patient  had a PCN reaction causing severe rash involving mucus membranes or skin necrosis: Yes Has patient had a PCN reaction that required hospitalization: No Has patient had a PCN reaction occurring within the last 10 years: No If all of the above answers are "NO", then may proceed with Cephalosporin use.    HOME MEDICATIONS: Outpatient Medications Prior to Visit  Medication Sig Dispense Refill  . ALPRAZolam (XANAX) 0.5 MG tablet Take 1 tablet (0.5 mg total) by mouth at bedtime as needed for anxiety. 30 tablet 2  . amphetamine-dextroamphetamine (ADDERALL XR) 20 MG 24 hr capsule Take 1 capsule (20 mg total) by mouth daily. 30 capsule 0  . baclofen (LIORESAL) 10 MG tablet Take 1 tablet (10 mg total) by mouth at bedtime as needed for muscle spasms. 12 each 0  . Cholecalciferol 1.25 MG (50000 UT) TABS 50,000 units PO qwk for 12 weeks. 12 tablet 0  . cholestyramine (QUESTRAN) 4 GM/DOSE powder Take 4 g by mouth 2 (two) times daily.  12  . eletriptan (RELPAX) 40 MG tablet Take 1 tablet (40 mg total) by mouth as needed for migraine or headache. May repeat in 2 hours if headache persists or recurs. 10 tablet 0  . erythromycin ophthalmic ointment Apply thin ribbon to affected eye daily 3.5 g 0  . hydroxychloroquine (PLAQUENIL) 200 MG tablet Take 1 tablet (200 mg total) by mouth 2 (two) times daily. 180 tablet 0  . ketorolac (TORADOL) 10 MG tablet Take 1 tablet (10 mg total) by mouth every 6 (six) hours as needed. Max 40mg  a day. Max 5 days a month. 20 tablet 3  . Lactobacillus-Inulin (PROBIOTIC DIGESTIVE SUPPORT PO) Take by mouth.    . ondansetron (ZOFRAN-ODT) 4 MG disintegrating tablet Take 1 tablet (4 mg total) by mouth every 8 (eight) hours as needed for nausea. 30 tablet 3   No facility-administered medications prior to visit.     PAST MEDICAL HISTORY: Past Medical History:  Diagnosis Date  . ADD (attention deficit disorder with hyperactivity)   . Adverse effect of unspecified general anesthetics,  subsequent encounter    difficulty waking up from anesthesia  . Anxiety   . Family history of anesthesia complication    N&V  . Migraines   . PONV (postoperative nausea and vomiting)   . Sjogren's disease ([place])     PAST SURGICAL HISTORY: Past Surgical History:  Procedure Laterality Date  . BREAST ENHANCEMENT SURGERY  2013   Dr. Lenon Curt  . CHOLECYSTECTOMY N/A 03/07/2016   Procedure: LAPAROSCOPIC CHOLECYSTECTOMY;  Surgeon: Coralie Keens, MD;  Location: [facility];  Service: General;  Laterality: N/A;  . HYSTEROSCOPY N/A 10/15/2013   Procedure: HYSTEROSCOPY with removal of IUD;  Surgeon: Lyman Speller, MD;  Location: [facility];  Service: Gynecology;  Laterality: N/A;  . INTRAUTERINE DEVICE (IUD) INSERTION  05/2017   Mirena   . WISDOM TOOTH EXTRACTION Bilateral 2008    FAMILY HISTORY: Family History  Problem Relation Age of Onset  . Sarcoidosis Mother   . Diabetes Mother   . Diabetes Father   . Diabetes Brother   . Healthy Son   . Heart disease Maternal Grandfather   . Diabetes Paternal  Grandfather   . Heart disease Paternal Grandfather   . Healthy Daughter     SOCIAL HISTORY: Social History   Socioeconomic History  . Marital status: Married    Spouse name: Not on file  . Number of children: 2  . Years of education: Not on file  . Highest education level: Some college, no degree  Occupational History  . Not on file  Social Needs  . Financial resource strain: Not on file  . Food insecurity    Worry: Not on file    Inability: Not on file  . Transportation needs    Medical: Not on file    Non-medical: Not on file  Tobacco Use  . Smoking status: Never Smoker  . Smokeless tobacco: Never Used  Substance and Sexual Activity  . Alcohol use: No    Alcohol/week: 0.0 standard drinks  . Drug use: No  . Sexual activity: Yes    Partners: Male    Birth control/protection: I.U.D.    Comment: Mirena placed 05/29/17  Lifestyle  . Physical activity     Days per week: Not on file    Minutes per session: Not on file  . Stress: Not on file  Relationships  . Social Herbalist on phone: Not on file    Gets together: Not on file    Attends religious service: Not on file    Active member of club or organization: Not on file    Attends meetings of clubs or organizations: Not on file    Relationship status: Not on file  . Intimate partner violence    Fear of current or ex partner: Not on file    Emotionally abused: Not on file    Physically abused: Not on file    Forced sexual activity: Not on file  Other Topics Concern  . Not on file  Social History Narrative   Lives at home with her husband and children   Right handed   Caffeine: none      PHYSICAL EXAM  There were no vitals filed for this visit. There is no height or weight on file to calculate BMI.  Generalized: Well developed, in no acute distress  Cardiology: normal rate and rhythm, no murmur noted Neurological examination  Mentation: Alert oriented to time, place, history taking. Follows all commands speech and language fluent Cranial nerve II-XII: Pupils were equal round reactive to light. Extraocular movements were full, visual field were full on confrontational test. Facial sensation and strength were normal. Uvula tongue midline. Head turning and shoulder shrug  were normal and symmetric. Motor: The motor testing reveals 5 over 5 strength of all 4 extremities. Good symmetric motor tone is noted throughout.  Sensory: Sensory testing is intact to soft touch on all 4 extremities. No evidence of extinction is noted.  Coordination: Cerebellar testing reveals good finger-nose-finger and heel-to-shin bilaterally.  Gait and station: Gait is normal. Tandem gait is normal. Romberg is negative. No drift is seen.  Reflexes: Deep tendon reflexes are symmetric and normal bilaterally.   DIAGNOSTIC DATA (LABS, IMAGING, TESTING) - I reviewed patient records, labs, notes,  testing and imaging myself where available.  No flowsheet data found.   Lab Results  Component Value Date   WBC 8.6 04/13/2018   HGB 13.1 06/18/2018   HCT 41.0 04/13/2018   MCV 83.9 04/13/2018   PLT 260.0 04/13/2018      Component Value Date/Time   NA 140 04/13/2018 1605   K 4.1  04/13/2018 1605   CL 104 04/13/2018 1605   CO2 27 04/13/2018 1605   GLUCOSE 92 04/13/2018 1605   BUN 19 04/13/2018 1605   CREATININE 0.98 04/13/2018 1605   CREATININE 0.79 05/23/2015 0939   CALCIUM 9.2 04/13/2018 1605   PROT 6.6 04/13/2018 1605   ALBUMIN 4.3 04/13/2018 1605   AST 14 04/13/2018 1605   ALT 10 04/13/2018 1605   ALKPHOS 50 04/13/2018 1605   BILITOT 0.7 04/13/2018 1605   GFRNONAA >60 12/06/2016 0904   GFRAA >60 12/06/2016 0904   Lab Results  Component Value Date   CHOL 179 04/13/2018   HDL 44.20 04/13/2018   LDLCALC 106 (H) 04/13/2018   TRIG 145.0 04/13/2018   CHOLHDL 4 04/13/2018   Lab Results  Component Value Date   HGBA1C 5.5 04/13/2018   Lab Results  Component Value Date   VITAMINB12 697 04/13/2018   Lab Results  Component Value Date   TSH 0.56 04/13/2018       ASSESSMENT AND PLAN 33 y.o. year old female  has a past medical history of ADD (attention deficit disorder with hyperactivity), Adverse effect of unspecified general anesthetics, subsequent encounter, Anxiety, Family history of anesthesia complication, Migraines, PONV (postoperative nausea and vomiting), and Sjogren's disease ([place]). here with ***    ICD-10-CM   1. Chronic migraine without aura without status migrainosus, not intractable  G43.709   2. Eye irritation  H57.89        No orders of the defined types were placed in this encounter.    No orders of the defined types were placed in this encounter.     I spent 15 minutes with the patient. 50% of this time was spent counseling and educating patient on plan of care and medications.    Debbora Presto, FNP-C 10/27/2018, 1:02 PM [facility] [address] [place], [place] [address] [phone]

## 2018-10-27 NOTE — Progress Notes (Deleted)
Office Visit Note  Patient: Teresa Holden             Date of Birth: 1985/06/12           MRN: [id number]             PCP: Briscoe Deutscher, DO Referring: Briscoe Deutscher, DO Visit Date: 11/05/2018 Occupation: @GUAROCC @  Subjective:  No chief complaint on file.  Plaquenil 200 mg 1 tablet twice daily. No Plaquenil eye exam on file.  Most recent CBC/CMP within normal limits on 04/13/2018.  Due for CBC/CMP today and will monitor every 5 months.   History of Present Illness: AUDRIELLA CREGAR is a 33 y.o. female ***   Activities of Daily Living:  Patient reports morning stiffness for *** {minute/hour:19697}.   Patient {ACTIONS;DENIES/REPORTS:21021675::"Denies"} nocturnal pain.  Difficulty dressing/grooming: {ACTIONS;DENIES/REPORTS:21021675::"Denies"} Difficulty climbing stairs: {ACTIONS;DENIES/REPORTS:21021675::"Denies"} Difficulty getting out of chair: {ACTIONS;DENIES/REPORTS:21021675::"Denies"} Difficulty using hands for taps, buttons, cutlery, and/or writing: {ACTIONS;DENIES/REPORTS:21021675::"Denies"}  No Rheumatology ROS completed.   PMFS History:  Patient Active Problem List   Diagnosis Date Noted  . Attention deficit hyperactivity disorder (ADHD), predominantly inattentive type 04/14/2018  . Chronic fatigue 04/14/2018  . 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 Sjogren's syndrome ([place]) 08/29/2016  . Chronic migraine without aura without status migrainosus, not intractable 06/10/2015  . Anxiety     Past Medical History:  Diagnosis Date  . ADD (attention deficit disorder with hyperactivity)   . Adverse effect of unspecified general anesthetics, subsequent encounter    difficulty waking up from anesthesia  . Anxiety   . Family history of anesthesia complication    N&V  . Migraines   . PONV (postoperative nausea and vomiting)   . Sjogren's disease ([place])     Family History  Problem Relation Age of Onset  . Sarcoidosis Mother   . Diabetes Mother   . Diabetes Father    . Diabetes Brother   . Healthy Son   . Heart disease Maternal Grandfather   . Diabetes Paternal Grandfather   . Heart disease Paternal Grandfather   . Healthy Daughter    Past Surgical History:  Procedure Laterality Date  . BREAST ENHANCEMENT SURGERY  2013   Dr. Lenon Curt  . CHOLECYSTECTOMY N/A 03/07/2016   Procedure: LAPAROSCOPIC CHOLECYSTECTOMY;  Surgeon: Coralie Keens, MD;  Location: [facility];  Service: General;  Laterality: N/A;  . HYSTEROSCOPY N/A 10/15/2013   Procedure: HYSTEROSCOPY with removal of IUD;  Surgeon: Lyman Speller, MD;  Location: [facility];  Service: Gynecology;  Laterality: N/A;  . INTRAUTERINE DEVICE (IUD) INSERTION  05/2017   Mirena   . WISDOM TOOTH EXTRACTION Bilateral 2008   Social History   Social History Narrative   Lives at home with her husband and children   Right handed   Caffeine: none   Immunization History  Administered Date(s) Administered  . Influenza,inj,Quad PF,6+ Mos 11/18/2012  . Influenza-Unspecified 11/18/2017  . Tdap 04/13/2018     Objective: Vital Signs: There were no vitals taken for this visit.   Physical Exam   Musculoskeletal Exam: ***  CDAI Exam: CDAI Score: - Patient Global: -; Provider Global: - Swollen: -; Tender: - Joint Exam   No joint exam has been documented for this visit   There is currently no information documented on the homunculus. Go to the Rheumatology activity and complete the homunculus joint exam.  Investigation: No additional findings.  Imaging: No results found.  Recent Labs: Lab Results  Component Value Date   WBC 8.6 04/13/2018  HGB 13.1 06/18/2018   PLT 260.0 04/13/2018   NA 140 04/13/2018   K 4.1 04/13/2018   CL 104 04/13/2018   CO2 27 04/13/2018   GLUCOSE 92 04/13/2018   BUN 19 04/13/2018   CREATININE 0.98 04/13/2018   BILITOT 0.7 04/13/2018   ALKPHOS 50 04/13/2018   AST 14 04/13/2018   ALT 10 04/13/2018   PROT 6.6 04/13/2018   ALBUMIN 4.3 04/13/2018    CALCIUM 9.2 04/13/2018   GFRAA >60 12/06/2016    Speciality Comments: No specialty comments available.  Procedures:  No procedures performed Allergies: Penicillins   Assessment / Plan:     Visit Diagnoses: No diagnosis found.  Orders: No orders of the defined types were placed in this encounter.  No orders of the defined types were placed in this encounter.   Face-to-face time spent with patient was *** minutes. Greater than 50% of time was spent in counseling and coordination of care.  Follow-Up Instructions: No follow-ups on file.   Ofilia Neas, PA-C  Note - This record has been created using Dragon software.  Chart creation errors have been sought, but may not always  have been located. Such creation errors do not reflect on  the standard of medical care.

## 2018-10-28 ENCOUNTER — Other Ambulatory Visit: Payer: Self-pay

## 2018-10-28 ENCOUNTER — Ambulatory Visit: Payer: 59 | Admitting: Family Medicine

## 2018-10-28 ENCOUNTER — Encounter: Payer: Self-pay | Admitting: Family Medicine

## 2018-10-28 VITALS — Temp 98.0°F

## 2018-10-28 DIAGNOSIS — G43709 Chronic migraine without aura, not intractable, without status migrainosus: Secondary | ICD-10-CM | POA: Diagnosis not present

## 2018-10-28 DIAGNOSIS — H5789 Other specified disorders of eye and adnexa: Secondary | ICD-10-CM

## 2018-10-28 NOTE — Progress Notes (Signed)
Botox- 100 units x 2 vials Lot: [id number] Expiration: 07/2021 NDC: [id number]  Bacteriostatic 0.9% Sodium Chloride- 30mL total Lot: [id number] Expiration: 08/05/2019 NDC: [id number]  Dx: [id number]  B/B

## 2018-10-29 ENCOUNTER — Other Ambulatory Visit: Payer: Self-pay | Admitting: Family Medicine

## 2018-10-29 DIAGNOSIS — E559 Vitamin D deficiency, unspecified: Secondary | ICD-10-CM

## 2018-10-29 MED FILL — ALPRAZolam 0.5 MG TABS: 0.5 | 30 days supply | Qty: 30 | Fill #1

## 2018-11-05 ENCOUNTER — Other Ambulatory Visit: Payer: Self-pay

## 2018-11-05 ENCOUNTER — Telehealth (INDEPENDENT_AMBULATORY_CARE_PROVIDER_SITE_OTHER): Payer: 59 | Admitting: Rheumatology

## 2018-11-05 ENCOUNTER — Encounter: Payer: Self-pay | Admitting: Rheumatology

## 2018-11-05 DIAGNOSIS — R768 Other specified abnormal immunological findings in serum: Secondary | ICD-10-CM

## 2018-11-05 DIAGNOSIS — R5383 Other fatigue: Secondary | ICD-10-CM

## 2018-11-05 DIAGNOSIS — M3500 Sicca syndrome, unspecified: Secondary | ICD-10-CM

## 2018-11-05 DIAGNOSIS — M2550 Pain in unspecified joint: Secondary | ICD-10-CM

## 2018-11-05 DIAGNOSIS — Z8659 Personal history of other mental and behavioral disorders: Secondary | ICD-10-CM

## 2018-11-05 DIAGNOSIS — F900 Attention-deficit hyperactivity disorder, predominantly inattentive type: Secondary | ICD-10-CM | POA: Diagnosis not present

## 2018-11-05 DIAGNOSIS — Z79899 Other long term (current) drug therapy: Secondary | ICD-10-CM

## 2018-11-05 DIAGNOSIS — G43709 Chronic migraine without aura, not intractable, without status migrainosus: Secondary | ICD-10-CM | POA: Diagnosis not present

## 2018-11-05 MED ORDER — HYDROXYCHLOROQUINE SULFATE 200 MG PO TABS: 200.0000 mg | ORAL_TABLET | Freq: Two times a day (BID) | ORAL | 0 refills | Status: DC

## 2018-11-05 MED FILL — HYDROXYCHLOROQUINE SULFATE: 200 | 90 days supply | Qty: 180 | Fill #0

## 2018-11-05 NOTE — Progress Notes (Signed)
Virtual Visit via Video Note  I connected with Teresa Holden on 11/05/18 at  3:45 PM EDT by a video enabled telemedicine application and verified that I am speaking with the correct person using two identifiers.  Location: Patient: Home Provider: Clinic  This service was conducted via virtual visit.  Both audio and visual tools were used.  The patient was located at home. I was located in my office.  Consent was obtained prior to the virtual visit and is aware of possible charges through their insurance for this visit.  The patient is an established patient.  Dr. Estanislado Pandy, MD conducted the virtual visit and Hazel Sams, PA-C acted as scribe during the service.  Office staff helped with scheduling follow up visits after the service was conducted.   I discussed the limitations of evaluation and management by telemedicine and the availability of in person appointments. The patient expressed understanding and agreed to proceed.  CC: Sicca symptoms  History of Present Illness: Patient is a 33 year old female history of Sjogren's and hypermobility arthralgia. She is taking Plaquenil 200 mg 1 tablet by mouth twice daily.  She has noticed improvement in her level of fatigue and arthralgias since starting on PLQ.  She denies any joint pain or joint swelling.  She has intermittent joint stiffness.  She states the myalgias have also improved. She states the sicca symptoms are manageable.  She drinks to drink a lot of water throughout the day. She uses eye drops as needed. She denies any new concerns at this time.   Review of Systems  Constitutional: Negative for fever and malaise/fatigue.  HENT:       +Mouth dryness  Eyes: Negative for photophobia, pain, discharge and redness.       +Eye dryness  Respiratory: Negative for cough, shortness of breath and wheezing.   Cardiovascular: Negative for chest pain and palpitations.  Gastrointestinal: Negative for blood in stool, constipation and diarrhea.   Genitourinary: Negative for dysuria.  Musculoskeletal: Negative for back pain, joint pain, myalgias and neck pain.  Skin: Negative for rash.  Neurological: Negative for dizziness and headaches.  Psychiatric/Behavioral: Negative for depression. The patient is not nervous/anxious and does not have insomnia.       Observations/Objective: Physical Exam  Constitutional: She is oriented to person, place, and time and well-developed, well-nourished, and in no distress.  HENT:  Head: Normocephalic and atraumatic.  Eyes: Conjunctivae are normal.  Pulmonary/Chest: Effort normal.  Neurological: She is alert and oriented to person, place, and time.  Psychiatric: Mood, memory, affect and judgment normal.   Patient reports morning stiffness for 0 minutes.   Patient denies nocturnal pain.  Difficulty dressing/grooming: Denies Difficulty climbing stairs: Denies Difficulty getting out of chair: Denies Difficulty using hands for taps, buttons, cutlery, and/or writing: Denies   Assessment and Plan: Visit Diagnoses: Sjogren's syndrome, with unspecified organ involvement ([place]): She has chronic sicca symptoms.  She has not had parotid swelling or enlarged lymph nodes.  She has not required biotene products, but she tries to drink a lot of fluids.  She uses eyedrops as needed for eye dryness. She was started on Plaquenil 200 mg 1 tablet by mouth twice daily in May 2020, and she has noticed a significant improvement in her symptoms. She will continue taking PLQ as prescribed.  A refill of PLQ was sent to the pharmacy. She will follow up in 2 months.    High risk medication use -Plaquenil 200 mg 1 tablet by mouth twice  daily.  She is overdue for lab work.  Standing orders for CBC and CMP are in place for [facility].  She will update lab work in the next few days.  She reports she has had a PLQ eye exam, but we do not have the record on file.   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  She has intermittent joint stiffness.  She has noticed a significant improvement in arthralgias and fatigue since starting on PLQ.  She is taking PLQ 200 mg BID.  A refill was sent to the pharmacy today.  She was advised to notify us if she develops increased joint pain or joint swelling.    Other fatigue: Her level of fatigue has improved since starting on Plaquenil.    Other medical conditions are listed as follows:   Attention deficit hyperactivity disorder (ADHD), predominantly inattentive type  History of anxiety  Chronic migraine without aura without status migrainosus, not intractable    Follow Up Instructions: She will follow up in November 2020   I discussed the assessment and treatment plan with the patient. The patient was provided an opportunity to ask questions and all were answered. The patient agreed with the plan and demonstrated an understanding of the instructions.   The patient was advised to call back or seek an in-person evaluation if the symptoms worsen or if the condition fails to improve as anticipated.  I provided 15 minutes of non-face-to-face time during this encounter.   Bo Merino, MD   Scribed byLovena Le dale PA-C

## 2018-11-06 ENCOUNTER — Other Ambulatory Visit: Payer: Self-pay | Admitting: Family Medicine

## 2018-11-06 ENCOUNTER — Other Ambulatory Visit: Payer: Self-pay | Admitting: Neurology

## 2018-11-06 DIAGNOSIS — E559 Vitamin D deficiency, unspecified: Secondary | ICD-10-CM

## 2018-11-06 MED FILL — ETONOGESTREL-ETHINYL ESTRAD: 0.12-0.015 | 84 days supply | Qty: 3 | Fill #1

## 2018-11-06 MED FILL — ONDANSETRON ODT 4 MG TABLET: 4 | 10 days supply | Qty: 30 | Fill #0

## 2018-11-06 MED FILL — KETOROLAC 10 MG TABLET: 10 | 30 days supply | Qty: 20 | Fill #1

## 2018-11-10 ENCOUNTER — Telehealth (INDEPENDENT_AMBULATORY_CARE_PROVIDER_SITE_OTHER): Payer: 59 | Admitting: Family Medicine

## 2018-11-10 ENCOUNTER — Other Ambulatory Visit: Payer: Self-pay

## 2018-11-10 DIAGNOSIS — G43709 Chronic migraine without aura, not intractable, without status migrainosus: Secondary | ICD-10-CM | POA: Diagnosis not present

## 2018-11-10 NOTE — Patient Instructions (Signed)

## 2018-11-10 NOTE — Progress Notes (Addendum)
PATIENT: Teresa Holden DOB: 1986-01-28  REASON FOR VISIT: follow up HISTORY FROM: patient  Virtual Visit via Telephone Note  I connected with Teresa Holden on 11/11/18 at 11:00 AM EDT by telephone and verified that I am speaking with the correct person using two identifiers.   I discussed the limitations, risks, security and privacy concerns of performing an evaluation and management service by telephone and the availability of in person appointments. I also discussed with the patient that there may be a patient responsible charge related to this service. The patient expressed understanding and agreed to proceed.   History of Present Illness:  11/11/18 Teresa Holden is a 33 y.o. female here today for follow up for migraines. She is doing very well on Botox therapy. She continues eletriptan and ondansetron as needed for abortive therapy.  She does note excessive sleepiness with eletriptan but feels that it is very effective.  She may have 1-2 migraine days per month on Botox therapy.  She feels that she is doing very well.  She does report an upcoming change in employment.  She may need to consider self-pay options for Botox therapy.  History (copied from Dr Cathren Laine note on 10/08/2017)  Interval history: Patient is here for follow-up of chronic migraines.  She has chronic intractable migraines and in the past had Botox injections which she did exceptionally well with unfortunately she became pregnant and had to stop.  The migraine injection worked so well and gave her greater than 50% decrease in frequency and severity.  Patient has 20 headache days a month and 12 of those are migrainous.  Migraines are unilateral, behind the eyes, pulsating pounding and throbbing, with light and sound sensitivity, nausea and vomiting, they are severe and it hurts her to even move.  No aura.  No medication overuse.  Migraines can last 24 to 72 hours.  Ongoing at this frequency for more than 9  months.  She is failed multiple medications.  Discussed restarting Botox.  Would not start her on CGRP as she is thinking about having another child.  No aura.  No medication overuse.  She is failed multiple classes of medications after good trial of at least 6 months on each medication.  HPI:  Teresa Holden is a 33 y.o. female here as a referral from Dr. Talbert Holden. Teresa Pimple MD or migraines. PMhx migraines, Sjogren's syndrome, Anxiety.  Symptoms started 10 years ago. She [facility] and was placed on topamax with good results. She stopped it on her own when the symptoms improved. Headaches are worse with stress otherwise no known triggers, no known inciting events, no trauma. She was restarted on 25mg  of Topamax and she felt crummy and stopped it. She takes imitrex and feels hot and dizzy. She tried maxalt, relpax. Migraines 3-4x a week. She had one for 4 days last week. On the left side, pouding, throbbing, +light sensitivity, +Loud noises bother her, she has to lay down, +nausea but no vomiting. Has tension in the shoulders. She tried lexapro in the past, naproxen, toradol, zofran, phenergan, zanaflex. She has been having migraines at this frequency for over 6 months. She has 20 headache days a month with at least 10 migrainous. Some mornings she wakes up with the headaches. The migraines last 24 hours or longer. They can be severe 10 out of 10 in pain. No vision changes, no dysarthria, no dysphagia, no neck pain, no other focal neurologic symptoms or deficits.No medication  overuse. No family history of migraines. Mother with history of sarcoidosis. Toradol shots in the past that helped the best for acute management of migraines. No aura.  Tried: Topiramate, Imitrex (felt hot and dizzy), Maxalt, Relpax, Lexapro, naproxen, Toradol, Zofran, Phenergan, Zanaflex.   Reviewed notes, labs and imaging from outside physicians, which showed:   CT of the head 03/14/2011 showed No acute  intracranial abnormalities including mass lesion or mass effect, hydrocephalus, extra-axial fluid collection, midline shift, hemorrhage, or acute infarction, large ischemic events (personally reviewed images) CBC and CMP unremarkable, creatinine 0.79. TSH 1.12 within normal limits.   Observations/Objective:  Generalized: Well developed, in no acute distress  Mentation: Alert oriented to time, place, history taking. Follows all commands speech and language fluent   Assessment and Plan:  33 y.o. year old female  has a past medical history of ADD (attention deficit disorder with hyperactivity), Adverse effect of unspecified general anesthetics, subsequent encounter, Anxiety, Family history of anesthesia complication, Migraines, PONV (postoperative nausea and vomiting), and Sjogren's disease ([place]). here with    ICD-10-CM   1. Chronic migraine without aura without status migrainosus, not intractable  G43.709    Teresa Holden is doing very well Botox therapy.  She has noted greater than 50% reduction in migraine intensity and frequency.  We will continue Botox every 12 weeks as indicated for migraine prevention.  She will also continue eletriptan and Zofran as needed for nausea associated with migraines.  We will provide resources for continuation as a self-pay patient with our Botox program as requested.  She will follow-up annually for office visit and every 12 weeks for Botox protocol.  She verbalizes understanding and agreement with this plan.  No orders of the defined types were placed in this encounter.   No orders of the defined types were placed in this encounter.    Follow Up Instructions:  I discussed the assessment and treatment plan with the patient. The patient was provided an opportunity to ask questions and all were answered. The patient agreed with the plan and demonstrated an understanding of the instructions.   The patient was advised to call back or seek an in-person evaluation if  the symptoms worsen or if the condition fails to improve as anticipated.  I provided 15 minutes of non-face-to-face time during this encounter.  Patient is located at her place of employment during visit.  Providers in the office.   Debbora Presto, NP   Made any corrections needed, and agree with history, physical, neuro exam,assessment and plan as stated.     Sarina Ill, MD [facility]

## 2018-11-11 ENCOUNTER — Telehealth: Payer: Self-pay | Admitting: Family Medicine

## 2018-11-11 ENCOUNTER — Encounter: Payer: Self-pay | Admitting: Family Medicine

## 2018-11-11 NOTE — Telephone Encounter (Signed)
[name]!  Can you please reach out to Teresa Holden to see if there are any resources we can provide to keep her in her Botox program.  She will become unemployed effective November 30.  I believe that next Botox should not be due around mid December.  She would like to consider self-pay options.  Thank you!

## 2018-11-12 ENCOUNTER — Other Ambulatory Visit: Payer: Self-pay | Admitting: Family Medicine

## 2018-11-12 DIAGNOSIS — F900 Attention-deficit hyperactivity disorder, predominantly inattentive type: Secondary | ICD-10-CM

## 2018-11-13 NOTE — Telephone Encounter (Signed)
   Last script 10/24/2018 #30 no rf  Last seen 07/13/2018 No f/u

## 2018-11-16 NOTE — Telephone Encounter (Addendum)
I called the patient to discuss patient assistance and go over self pay options. She did not answer and her VM was full.

## 2018-11-30 ENCOUNTER — Other Ambulatory Visit: Payer: Self-pay | Admitting: Family Medicine

## 2018-11-30 DIAGNOSIS — E559 Vitamin D deficiency, unspecified: Secondary | ICD-10-CM

## 2018-11-30 MED FILL — KETOROLAC 10 MG TABLET: 10 | 30 days supply | Qty: 20 | Fill #2

## 2018-11-30 MED FILL — ALPRAZolam 0.5 MG TABS: 0.5 | 30 days supply | Qty: 30 | Fill #2

## 2018-11-30 MED FILL — ADDERALL XR 20 MG CAP SA: 20 | 30 days supply | Qty: 30 | Fill #0

## 2018-11-30 MED FILL — ONDANSETRON ODT 4 MG TABLET: 4 | 10 days supply | Qty: 30 | Fill #1

## 2018-12-07 ENCOUNTER — Encounter: Payer: Self-pay | Admitting: Obstetrics & Gynecology

## 2018-12-07 ENCOUNTER — Other Ambulatory Visit: Payer: Self-pay

## 2018-12-07 ENCOUNTER — Other Ambulatory Visit (HOSPITAL_COMMUNITY)
Admission: RE | Admit: 2018-12-07 | Discharge: 2018-12-07 | Disposition: A | Discharge status: Home / Self Care | Payer: 59 | Source: Ambulatory Visit | Attending: Obstetrics & Gynecology | Admitting: Obstetrics & Gynecology

## 2018-12-07 ENCOUNTER — Ambulatory Visit (INDEPENDENT_AMBULATORY_CARE_PROVIDER_SITE_OTHER): Payer: 59 | Admitting: Obstetrics & Gynecology

## 2018-12-07 VITALS — BP 120/65 | HR 72 | Temp 97.2°F | Ht 64.0 in | Wt 174.2 lb

## 2018-12-07 DIAGNOSIS — Z01419 Encounter for gynecological examination (general) (routine) without abnormal findings: Secondary | ICD-10-CM

## 2018-12-07 DIAGNOSIS — Z124 Encounter for screening for malignant neoplasm of cervix: Secondary | ICD-10-CM | POA: Insufficient documentation

## 2018-12-07 MED ORDER — NORETHINDRONE 0.35 MG PO TABS: 1.0000 | ORAL_TABLET | Freq: Every day | ORAL | 3 refills | Status: DC

## 2018-12-07 MED ORDER — CLOBETASOL PROPIONATE 0.05 % EX OINT: TOPICAL_OINTMENT | CUTANEOUS | 1 refills | Status: DC

## 2018-12-07 MED FILL — CLOBETASOL PROPIONATE 0.05: 0.05 | 30 days supply | Qty: 60 | Fill #0

## 2018-12-07 MED FILL — NORETHINDRONE 0.35 MG TABS: 0.35 | 84 days supply | Qty: 84 | Fill #0

## 2018-12-07 NOTE — Patient Instructions (Addendum)
Clobeatsol 0.05% ointment.  Apply 1-2 times daily to affected area for 2-3 weeks.  Then two to three times a week for another two weeks.  If you have a flare, use twice daily for up to a week.     Lichen Sclerosus Lichen sclerosus is a skin problem. It can happen on any part of the body. It happens most often in the anal or genital areas. It can cause itching and discomfort. Treatment can help to control symptoms. It can also help prevent scarring that may lead to other problems. What are the causes? The cause of this condition is not known. It is not passed from one person to another (not contagious). What increases the risk? This condition is more likely to develop in women. It most often occurs after menopause. What are the signs or symptoms? Symptoms of this condition include:  Thin, wrinkled, white areas on the skin.  Thickened white areas on the skin.  Red and swollen patches (lesions) on the skin.  Tears or cracks in the skin.  Bruising.  Blood blisters.  Very bad itching.  Pain, itching, or burning when peeing (urinating).  Trouble pooping (constipation). How is this diagnosed? This condition may be diagnosed with a physical exam. A sample of your skin may also be removed to be looked at under a microscope (biopsy). How is this treated? This condition may be treated with:  Creams or ointments (topical steroids) that are put on the skin in the affected areas. This is the most common treatment.  Medicines that are taken by mouth.  Surgery. This is only needed if the condition is very bad and is causing problems such as scarring. Follow these instructions at home:  Take or use over-the-counter and prescription medicines only as told by your doctor.  Use creams or ointments as told by your doctor.  Do not scratch the affected areas of skin.  If you are a woman, keep the vagina as clean and dry as you can.  Clean the affected area of skin gently with water. Avoid  using rough towels or toilet paper.  Keep all follow-up visits as told by your doctor. This is important. Contact a doctor if:  Your redness, swelling, or pain gets worse.  You have fluid, blood, or pus coming from the area.  You have new red and swollen patches on your skin.  You have a fever.  You have pain during sex. Summary  Lichen sclerosus is a skin problem. It can cause itching and discomfort.  This condition is usually treated with creams or ointments that are put on the skin in the affected areas.  Use medicines only as told by your doctor.  Do not scratch the affected areas of skin.  Keep all follow-up visits as told by your doctor. This is important. This information is not intended to replace advice given to you by your health care provider. Make sure you discuss any questions you have with your health care provider. Document Released: 01/04/2008 Document Revised: 06/05/2017 Document Reviewed: 06/05/2017 Elsevier Patient Education  2020 [employer].

## 2018-12-07 NOTE — Progress Notes (Addendum)
33 y.o. G66P1002 Married White or Caucasian female here for annual exam.  Cycles are regular.  Needs something for contraception.    Patient's last menstrual period was 11/23/2018 (exact date).          Sexually active: Yes.    The current method of family planning is condoms, sometimes, and natural family planning  Exercising: No.  Smoker:  no  Health Maintenance: Pap:  05/2014 History of abnormal Pap:  Yes, 15 years ago +, resolved spontaneously MMG:  Guidelines reviewed TDaP:  04/13/2018 Screening Labs: 04/2018   reports that she has never smoked. She has never used smokeless tobacco. She reports that she does not drink alcohol or use drugs.  Past Medical History:  Diagnosis Date  . ADD (attention deficit disorder with hyperactivity)   . Adverse effect of unspecified general anesthetics, subsequent encounter    difficulty waking up from anesthesia  . Anxiety   . Family history of anesthesia complication    N&V  . Migraines   . PONV (postoperative nausea and vomiting)   . Sjogren's disease [facility])     Past Surgical History:  Procedure Laterality Date  . BREAST ENHANCEMENT SURGERY  2013   Dr. Lenon Curt  . CHOLECYSTECTOMY N/A 03/07/2016   Procedure: LAPAROSCOPIC CHOLECYSTECTOMY;  Surgeon: Coralie Keens, MD;  Location: [facility];  Service: General;  Laterality: N/A;  . HYSTEROSCOPY N/A 10/15/2013   Procedure: HYSTEROSCOPY with removal of IUD;  Surgeon: Lyman Speller, MD;  Location: [facility];  Service: Gynecology;  Laterality: N/A;  . INTRAUTERINE DEVICE (IUD) INSERTION  05/2017   Mirena   . WISDOM TOOTH EXTRACTION Bilateral 2008    Current Outpatient Medications  Medication Sig Dispense Refill  . ADDERALL XR 20 MG 24 hr capsule TAKE 1 CAPSULE BY MOUTH ONCE A DAY 30 capsule 0  . ALPRAZolam (XANAX) 0.5 MG tablet Take 1 tablet (0.5 mg total) by mouth at bedtime as needed for anxiety. 30 tablet 2  . baclofen (LIORESAL) 10 MG tablet Take 1 tablet (10 mg total) by  mouth at bedtime as needed for muscle spasms. 12 each 0  . Cholecalciferol 1.25 MG (50000 UT) TABS 50,000 units PO qwk for 12 weeks. 12 tablet 0  . cholestyramine (QUESTRAN) 4 GM/DOSE powder Take 4 g by mouth 2 (two) times daily.  12  . eletriptan (RELPAX) 40 MG tablet Take 1 tablet (40 mg total) by mouth as needed for migraine or headache. May repeat in 2 hours if headache persists or recurs. 10 tablet 0  . hydroxychloroquine (PLAQUENIL) 200 MG tablet Take 1 tablet (200 mg total) by mouth 2 (two) times daily. 180 tablet 0  . ketorolac (TORADOL) 10 MG tablet Take 1 tablet (10 mg total) by mouth every 6 (six) hours as needed. Max 40mg  a day. Max 5 days a month. 20 tablet 3  . Lactobacillus-Inulin (PROBIOTIC DIGESTIVE SUPPORT PO) Take by mouth.    . ondansetron (ZOFRAN-ODT) 4 MG disintegrating tablet TAKE 1 TABLET (4 MG TOTAL) BY MOUTH EVERY 8 (EIGHT) HOURS AS NEEDED FOR NAUSEA. 30 tablet 3   No current facility-administered medications for this visit.     Family History  Problem Relation Age of Onset  . Sarcoidosis Mother   . Diabetes Mother   . Diabetes Father   . Diabetes Brother   . Healthy Son   . Heart disease Maternal Grandfather   . Diabetes Paternal Grandfather   . Heart disease Paternal Grandfather   . Healthy Daughter  Review of Systems  All other systems reviewed and are negative.   Exam:   BP 120/65   Pulse 72   Temp (!) 97.2 F (36.2 C) (Temporal)   Ht 5\' 4"  (1.626 m)   Wt 174 lb 3.2 oz (79 kg)   LMP 11/23/2018 (Exact Date)   BMI 29.90 kg/m    Height: 5\' 4"  (162.6 cm)  Ht Readings from Last 3 Encounters:  12/07/18 5\' 4"  (1.626 m)  10/19/18 5\' 3"  (1.6 m)  09/22/18 5\' 3"  (1.6 m)    General appearance: alert, cooperative and appears stated age Head: Normocephalic, without obvious abnormality, atraumatic Neck: no adenopathy, supple, symmetrical, trachea midline and thyroid normal to inspection and palpation Lungs: clear to auscultation bilaterally Breasts:  normal appearance, no masses or tenderness, bilateral implants Heart: regular rate and rhythm Abdomen: soft, non-tender; bowel sounds normal; no masses,  no organomegaly Extremities: extremities normal, atraumatic, no cyanosis or edema Skin: Skin color, texture, turgor normal. No rashes or lesions Lymph nodes: Cervical, supraclavicular, and axillary nodes normal. No abnormal inguinal nodes palpated Neurologic: Grossly normal   Pelvic: External genitalia:  Hypopigmented lesion above clitoris and symmetrically inferiorly to the labia majora              Urethra:  normal appearing urethra with no masses, tenderness or lesions              Bartholins and Skenes: normal                 Vagina: normal appearing vagina with normal color and discharge, no lesions              Cervix: no lesions              Pap taken: Yes.   Bimanual Exam:  Uterus:  normal size, contour, position, consistency, mobility, non-tender              Adnexa: normal adnexa and no mass, fullness, tenderness               Rectovaginal: Confirms               Anus:  normal sphincter tone, no lesions  Pt declines chaperone.  A:  Well Woman with normal exam Sjogren's disease Migraines Possible LS&A.  No biopsy obtained today. Breast implants  P:   Mammogram guidelines reviewed pap smear with HR HPV obtained today Blood work is UTD Rx for [employer] to pharmacy for 90 day supply/#4RF Rx for Clobetasol to be used for the next two to three weeks. Return annually or prn

## 2018-12-09 LAB — CYTOLOGY - PAP
Comment: NEGATIVE
Diagnosis: NEGATIVE
High risk HPV: NEGATIVE

## 2018-12-25 ENCOUNTER — Ambulatory Visit: Payer: 59 | Admitting: Obstetrics & Gynecology

## 2019-01-01 ENCOUNTER — Other Ambulatory Visit: Payer: Self-pay | Admitting: Physician Assistant

## 2019-01-01 ENCOUNTER — Other Ambulatory Visit: Payer: Self-pay | Admitting: Family Medicine

## 2019-01-01 DIAGNOSIS — M3500 Sicca syndrome, unspecified: Secondary | ICD-10-CM

## 2019-01-01 DIAGNOSIS — F900 Attention-deficit hyperactivity disorder, predominantly inattentive type: Secondary | ICD-10-CM

## 2019-01-01 MED FILL — KETOROLAC 10 MG TABLET: 10 | 30 days supply | Qty: 20 | Fill #3

## 2019-01-03 NOTE — Telephone Encounter (Signed)
Please call patient to set up virtual med refill with new provider

## 2019-01-04 ENCOUNTER — Other Ambulatory Visit: Payer: Self-pay | Admitting: *Deleted

## 2019-01-04 ENCOUNTER — Ambulatory Visit (INDEPENDENT_AMBULATORY_CARE_PROVIDER_SITE_OTHER): Payer: 59 | Admitting: Family Medicine

## 2019-01-04 ENCOUNTER — Telehealth: Payer: Self-pay | Admitting: *Deleted

## 2019-01-04 ENCOUNTER — Encounter: Payer: Self-pay | Admitting: Family Medicine

## 2019-01-04 DIAGNOSIS — Z3041 Encounter for surveillance of contraceptive pills: Secondary | ICD-10-CM | POA: Diagnosis not present

## 2019-01-04 DIAGNOSIS — F900 Attention-deficit hyperactivity disorder, predominantly inattentive type: Secondary | ICD-10-CM | POA: Diagnosis not present

## 2019-01-04 DIAGNOSIS — F419 Anxiety disorder, unspecified: Secondary | ICD-10-CM | POA: Diagnosis not present

## 2019-01-04 DIAGNOSIS — M3500 Sicca syndrome, unspecified: Secondary | ICD-10-CM

## 2019-01-04 DIAGNOSIS — G43709 Chronic migraine without aura, not intractable, without status migrainosus: Secondary | ICD-10-CM

## 2019-01-04 MED ORDER — AMPHETAMINE-DEXTROAMPHET ER 20 MG PO CP24: 20.0000 mg | ORAL_CAPSULE | Freq: Every day | ORAL | 0 refills | Status: DC

## 2019-01-04 MED FILL — ADDERALL XR 20 MG CAP SA: 20 | 30 days supply | Qty: 30 | Fill #0

## 2019-01-04 NOTE — Telephone Encounter (Signed)
Last RF PLQ 11/06/2018 Last visit 11/05/2018 No future appt scheduled Labs 06/25/2018 + ANA, La high 5.7 No TB Gold

## 2019-01-04 NOTE — Telephone Encounter (Signed)
Patient's insurance will expire 01/04/2019. Patient stated she will not be able to get her labs performed until 02/2019. A 30 day supply of PLQ was sent in.

## 2019-01-04 NOTE — Progress Notes (Signed)
TELEPHONE ENCOUNTER   Patient verbally agreed to telephone visit and is aware that copayment and coinsurance may apply. Patient was treated using telemedicine according to accepted telemedicine protocols.  Location of the patient: home Location of provider: [facility], [place] of all persons participating in the telemedicine service and role in the encounter: Leamon Arnt, MD, Janeann Forehand, RN   Subjective  CC: pt was unable to use doxy.me Chief Complaint  Patient presents with  . Transitions Of Care  . Anxiety  . ADHD    HPI: Teresa Holden is a 33 y.o. female who was telephoned today to address the problems listed above in the chief complaint.  33 yo with ADD: doing well on Adderall. Due refill.  Takes daily. I REVIEWED PMPA database and adderall refills are appropriate. Last 10/26. No AEs.   Anxiety: reports would like xanax refilled. Reports rare use for anxiety sxs. No h/o GAD or mood disorder. Would like to have on hand if needed. Reviewed database. Has had 90 pills filled since July. Last 30 was 10/26. She reports she only took twice last month. No worsening anxiety or mood sxs. No panic sxs.   TOC: reviewed chart, records from specialists, and discussed dxd of ADD, anxiety, migraines and sjogrens' on progesterone only bc. Sees gyn for this and had recent GYN CPE.   ASSESSMENT: 1. Anxiety   2. Attention deficit hyperactivity disorder (ADHD), predominantly inattentive type   3. Sjogren's syndrome, with unspecified organ involvement ([name])   4. Chronic migraine without aura without status migrainosus, not intractable   5. Oral contraceptive use      ADD:  Well controlled. Refilled 30 day supply. Will send request when next due. Recheck in office 13months.   Anxiety sxs: should have plenty of xanax on hand. Declined refill. To return if sxs worsen.   Fu with specialist. No med changes today   Time spent with the patient (non face-to-face  time during this virtual encounter): 21 minutes, spent in obtaining information about her symptoms, reviewing her previous labs, evaluations, and treatments, counseling her about her condition (please see the discussed topics above), and developing a plan to further investigate it; the patient was provided an opportunity to ask questions and all were answered. The patient agreed with the plan and demonstrated an understanding of the instructions.   The patient was advised to call back or seek an in-person evaluation if the symptoms worsen or if the condition fails to improve as anticipated.  Follow up: 3 months for add f/u  Visit date not found  No orders of the defined types were placed in this encounter.  Meds ordered this encounter  Medications  . amphetamine-dextroamphetamine (ADDERALL XR) 20 MG 24 hr capsule    Sig: Take 1 capsule (20 mg total) by mouth daily.    Dispense:  30 capsule    Refill:  0     I reviewed the patients updated PMH, FH, and SocHx.    Patient Active Problem List   Diagnosis Date Noted  . Attention deficit hyperactivity disorder (ADHD), predominantly inattentive type 04/14/2018    Priority: High  . Sjogren's syndrome ([place]) 08/29/2016    Priority: High  . Chronic migraine without aura without status migrainosus, not intractable 06/10/2015    Priority: High  . Oral contraceptive use 01/04/2019  . Chronic fatigue 04/14/2018  . 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 Anxiety    Current Meds  Medication Sig  . ALPRAZolam (  XANAX) 0.5 MG tablet Take 1 tablet (0.5 mg total) by mouth at bedtime as needed for anxiety.  Marland Kitchen amphetamine-dextroamphetamine (ADDERALL XR) 20 MG 24 hr capsule Take 1 capsule (20 mg total) by mouth daily.  . Cholecalciferol 1.25 MG (50000 UT) TABS 50,000 units PO qwk for 12 weeks.  . cholestyramine (QUESTRAN) 4 GM/DOSE powder Take 4 g by mouth 2 (two) times daily.  . clobetasol ointment (TEMOVATE) 0.05 % Apply to affected area 1-2 times daily  for next two to three weeks, then use twice weekly for another two weeks.  Marland Kitchen eletriptan (RELPAX) 40 MG tablet Take 1 tablet (40 mg total) by mouth as needed for migraine or headache. May repeat in 2 hours if headache persists or recurs.  Marland Kitchen ketorolac (TORADOL) 10 MG tablet Take 1 tablet (10 mg total) by mouth every 6 (six) hours as needed. Max 40mg  a day. Max 5 days a month.  . Lactobacillus-Inulin (PROBIOTIC DIGESTIVE SUPPORT PO) Take by mouth.  . norethindrone (MICRONOR) 0.35 MG tablet Take 1 tablet (0.35 mg total) by mouth daily.  . ondansetron (ZOFRAN-ODT) 4 MG disintegrating tablet TAKE 1 TABLET (4 MG TOTAL) BY MOUTH EVERY 8 (EIGHT) HOURS AS NEEDED FOR NAUSEA.  . [DISCONTINUED] ADDERALL XR 20 MG 24 hr capsule TAKE 1 CAPSULE BY MOUTH ONCE A DAY  . [DISCONTINUED] hydroxychloroquine (PLAQUENIL) 200 MG tablet Take 1 tablet (200 mg total) by mouth 2 (two) times daily.    Allergies: Patient is allergic to penicillins. Family History: Patient family history includes Diabetes in her brother, father, mother, and paternal grandfather; Healthy in her daughter, son, and son; Heart disease in her maternal grandfather and paternal grandfather; Sarcoidosis in her mother. Social History:  Patient  reports that she has never smoked. She has never used smokeless tobacco. She reports that she does not drink alcohol or use drugs.  Review of Systems: Constitutional: Negative for fever malaise or anorexia Cardiovascular: negative for chest pain Respiratory: negative for SOB or persistent cough Gastrointestinal: negative for abdominal pain

## 2019-01-04 NOTE — Telephone Encounter (Signed)
Patient is due to update lab work. Please advise patient to have lab work drawn ASAP.   Ok to refill 30-day supply of PLQ.

## 2019-01-04 NOTE — Telephone Encounter (Signed)
Called pt and scheduled for a virtual visit with Dr. Jonni Sanger today, as today is pts last day with insurance coverage.

## 2019-01-05 NOTE — Telephone Encounter (Signed)
Looks like Adderal was approved yesterday. Ok to give refill on Xanax?

## 2019-01-20 ENCOUNTER — Telehealth: Payer: Self-pay

## 2019-01-20 NOTE — Telephone Encounter (Signed)
Call to let her know if she can use reimbursement for self pay. DW

## 2019-01-20 NOTE — Telephone Encounter (Signed)
Can you tell me the out of pocket cost for 200 units of botox and 64615 CPT code?

## 2019-02-01 ENCOUNTER — Ambulatory Visit: Payer: 59 | Admitting: Obstetrics & Gynecology

## 2019-02-10 ENCOUNTER — Telehealth: Payer: Self-pay

## 2019-02-23 NOTE — Telephone Encounter (Signed)
I called and spoke with and rs her apt. She is going to call us with updated insurance information.

## 2019-02-24 ENCOUNTER — Ambulatory Visit: Payer: 59 | Admitting: Neurology

## 2019-03-08 ENCOUNTER — Other Ambulatory Visit: Payer: Self-pay

## 2019-03-08 ENCOUNTER — Telehealth: Payer: Self-pay | Admitting: Family Medicine

## 2019-03-08 DIAGNOSIS — G43709 Chronic migraine without aura, not intractable, without status migrainosus: Secondary | ICD-10-CM

## 2019-03-08 NOTE — Telephone Encounter (Signed)
Patient called in this morning asking if Dr.Andy could put in a referral to her Neurologist, she has an appointment with them tomorrow for injections for her headaches. She would like to know if she needs to cancel the appointment or if Dr.Andy could just put in this referral.

## 2019-03-08 NOTE — Telephone Encounter (Signed)
Referral to neurology placed.

## 2019-03-08 NOTE — Telephone Encounter (Signed)
Please place referral

## 2019-03-08 NOTE — Telephone Encounter (Signed)
Patient notified

## 2019-03-08 NOTE — Telephone Encounter (Signed)
Please advise 

## 2019-03-09 ENCOUNTER — Ambulatory Visit: Payer: Self-pay | Admitting: Neurology

## 2019-03-18 ENCOUNTER — Telehealth: Payer: Self-pay

## 2019-03-18 NOTE — Telephone Encounter (Signed)
I called [facility] to check coverage information, I was told pre cert was required and form needed to be faxed from website and the drug is available for BB Ref#[id number]. @8 :40 est. DWD

## 2019-03-23 ENCOUNTER — Telehealth: Payer: Self-pay

## 2019-03-23 ENCOUNTER — Ambulatory Visit: Payer: Self-pay | Admitting: Neurology

## 2019-03-23 NOTE — Telephone Encounter (Signed)
I called to check status of pre cert, we have faxed request on 02/11/ and 02/15. They are still stating they have not received it and are asking I send it again. DWD

## 2019-03-24 MED ORDER — BOTOX 100 UNITS IJ SOLR: INTRAMUSCULAR | 1 refills | Status: DC

## 2019-03-24 NOTE — Telephone Encounter (Signed)
I called to check pre cert and they had no received it I asked to speak with a clinical reviewer and submitted clinicals. [id number].   They have asked for a script to be sent to [facility] would you please submit?

## 2019-03-24 NOTE — Telephone Encounter (Signed)
botox order sent to [facility] in [facility].

## 2019-03-29 ENCOUNTER — Telehealth: Payer: Self-pay | Admitting: Family Medicine

## 2019-03-29 ENCOUNTER — Ambulatory Visit: Payer: Self-pay | Admitting: Family Medicine

## 2019-03-29 NOTE — Telephone Encounter (Signed)
danielle with [facility] is calling to get the ICD-10 code for the patients botox

## 2019-03-31 NOTE — Telephone Encounter (Signed)
I called to check status and they stated they are pending patient consent, they did speak with her but she was unsure of the copay and said she was going to speak with her husband and then call the pharmacy back.

## 2019-04-07 ENCOUNTER — Telehealth: Payer: Self-pay

## 2019-04-07 NOTE — Telephone Encounter (Signed)
Noted and documented

## 2019-04-07 NOTE — Telephone Encounter (Signed)
Patient received flu shot with [employer] around the beginning of October 2020.

## 2019-05-02 IMAGING — US US RENAL
1 series · 15 of 25 positions shown · non-contrast
Comparison: Pelvis ultrasound 04/25/2016. Right upper quadrant
ultrasound 02/19/2016.

CLINICAL DATA: 31-year-old female is pregnant in the second
trimester with left side, left upper quadrant pain.

EXAM:
RENAL / URINARY TRACT ULTRASOUND COMPLETE

[Series 1: us renal · 15 of 37 slices shown]
[im 1/37]
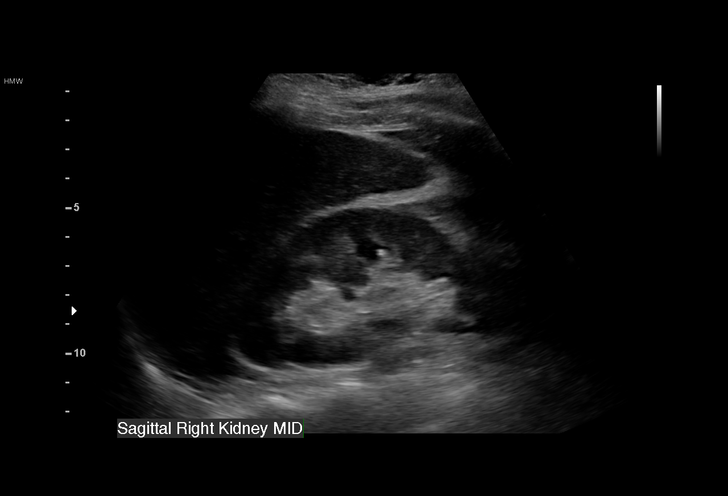
[im 4/37]
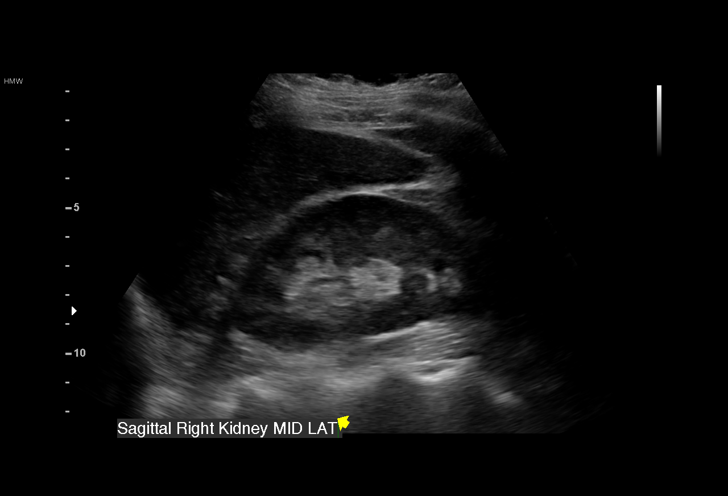
[im 7/37]
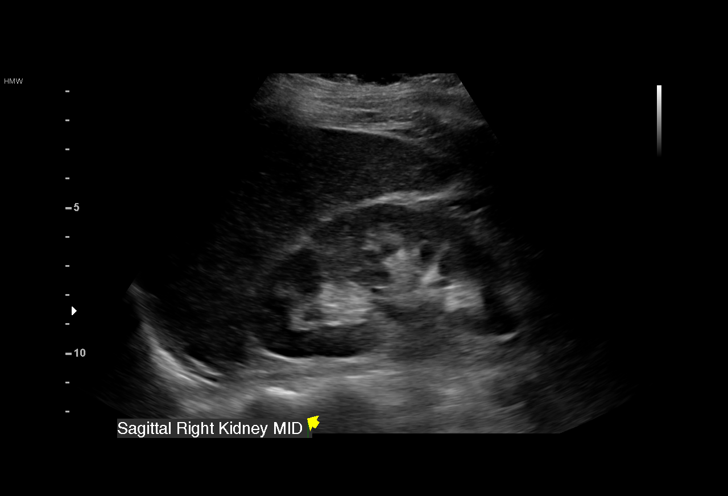
[im 8/37]
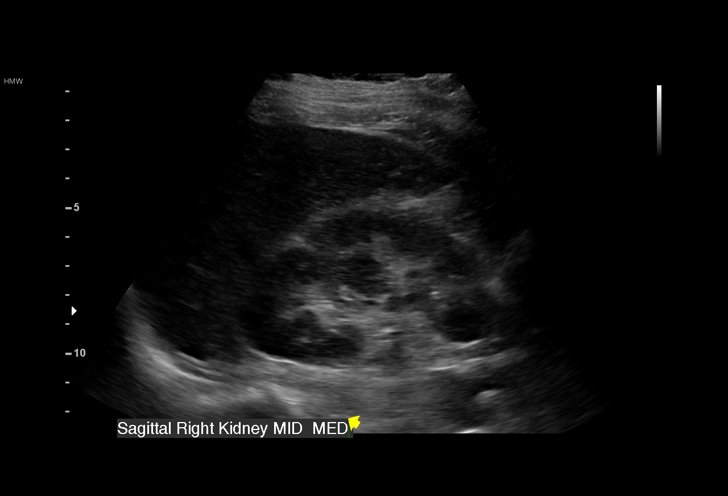
[im 11/37]
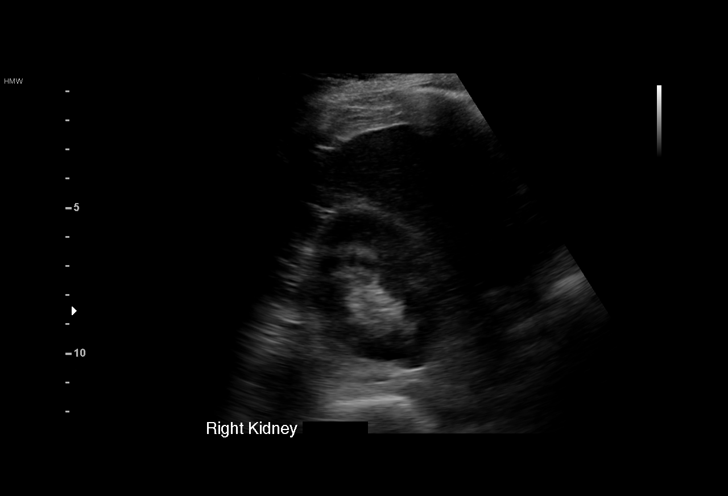
[im 14/37]
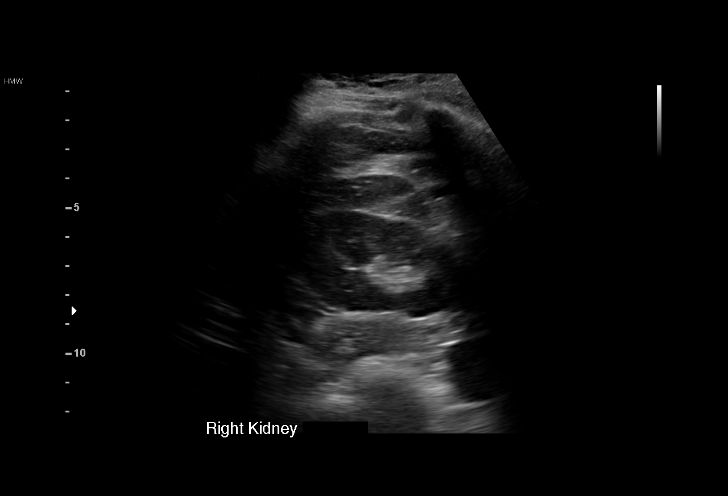
[im 16/37]
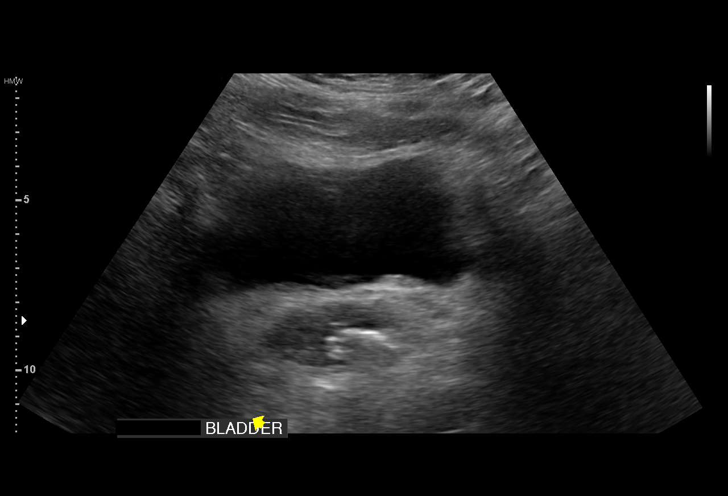
[im 19/37]
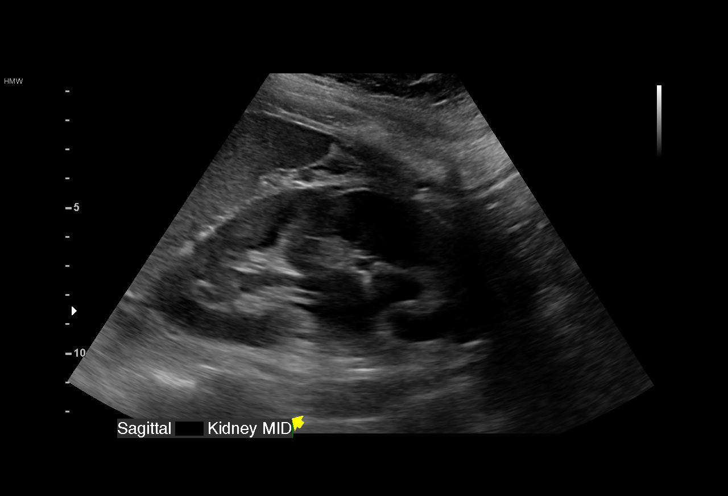
[im 22/37]
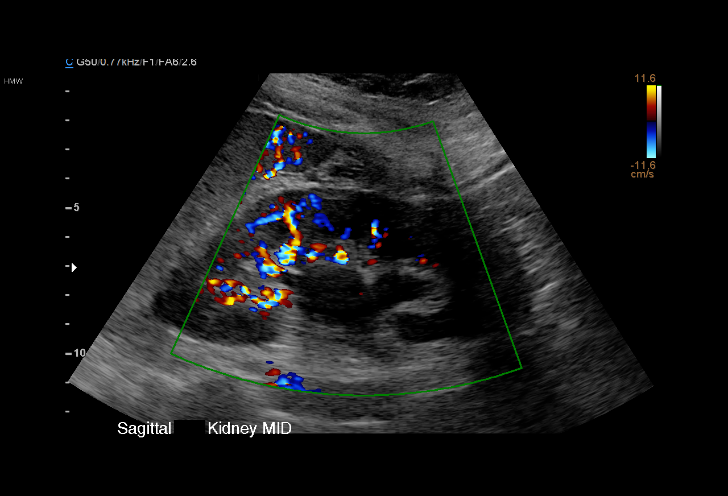
[im 23/37]
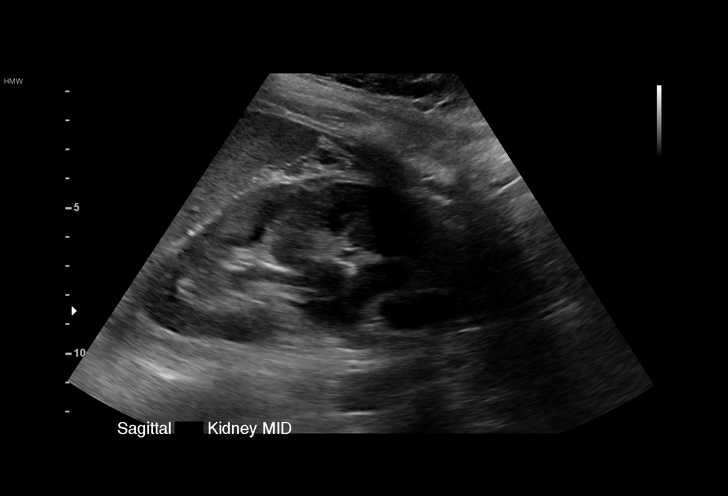
[im 26/37]
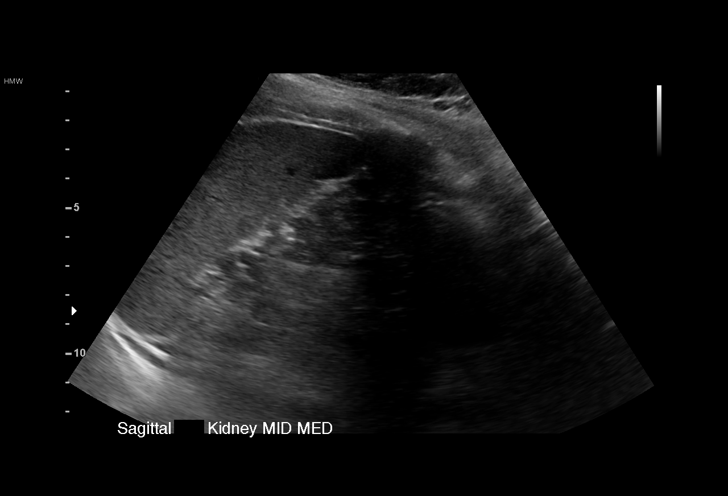
[im 29/37]
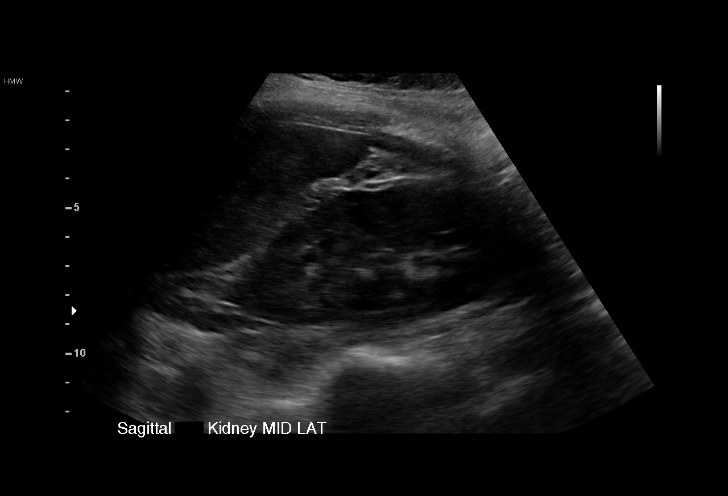
[im 31/37]
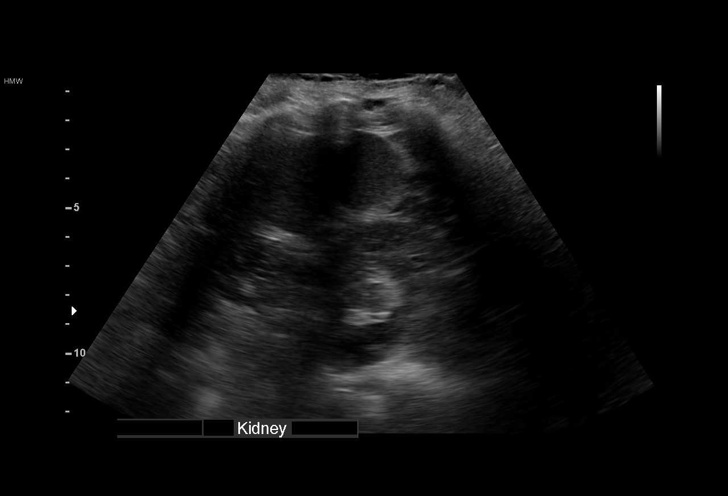
[im 34/37]
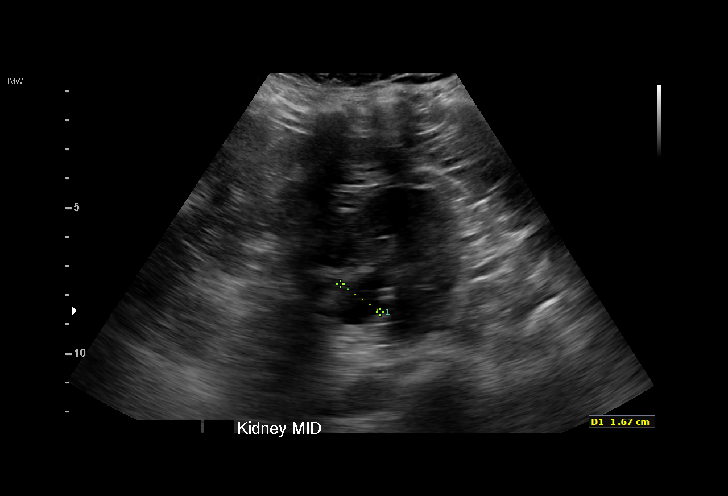
[im 37/37]
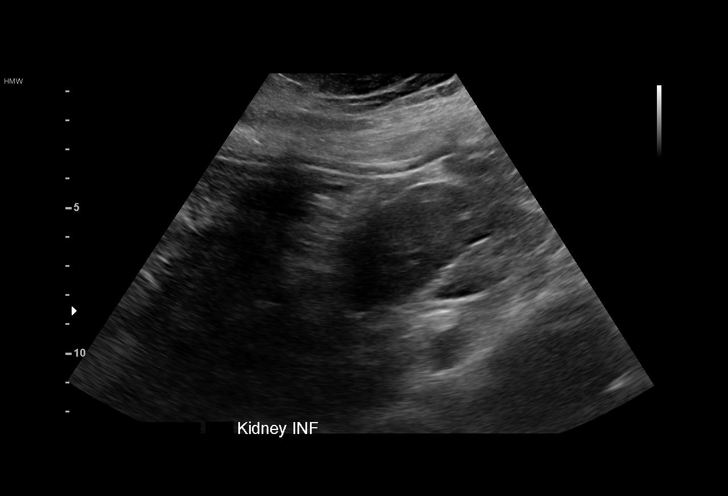

[15 of 25 positions shown; findings below may reference images not displayed]

FINDINGS: Right Kidney:

Length: 8.6 cm. Echogenicity within normal limits. No mass or
hydronephrosis visualized.

Left Kidney:

Length: 11.6 cm. There is mild to moderate left hydronephrosis
(image 23). Left renal cortical thickness and echogenicity remains
normal.

No nephrolithiasis is evident by ultrasound.

Bladder:

Appears normal for degree of bladder distention. Both ureteral jets
are detected with Doppler (image 19).
IMPRESSION: 1. Positive for mild to moderate left hydronephrosis.
Obstructing etiology is unclear, as maternal hydronephrosis of
pregnancy more commonly occurs on the right side.
2. The right kidney in urinary bladder appear normal.

## 2019-09-27 ENCOUNTER — Telehealth: Payer: Self-pay | Admitting: Family Medicine

## 2019-09-27 ENCOUNTER — Other Ambulatory Visit: Payer: Self-pay

## 2019-09-27 DIAGNOSIS — Z9049 Acquired absence of other specified parts of digestive tract: Secondary | ICD-10-CM

## 2019-09-27 NOTE — Telephone Encounter (Signed)
Patient is requesting a referral to her GI  Juanita Craver [phone]. Patient states that due to her insurance she needs a referral. Patient has appointment tomorrow 8/24 and states she will not be able to be seen.

## 2019-09-27 NOTE — Telephone Encounter (Signed)
Referral placed.

## 2019-11-19 ENCOUNTER — Telehealth: Payer: Self-pay

## 2019-11-19 NOTE — Telephone Encounter (Signed)
Pt has an appt Monday with Dr. Sumner Boast and she was just made aware that her insurance requires a referral.

## 2019-11-21 ENCOUNTER — Other Ambulatory Visit: Payer: Self-pay

## 2019-11-21 DIAGNOSIS — Z3009 Encounter for other general counseling and advice on contraception: Secondary | ICD-10-CM

## 2019-11-21 NOTE — Telephone Encounter (Signed)
Referral placed.

## 2019-11-22 ENCOUNTER — Other Ambulatory Visit: Payer: Self-pay

## 2019-11-22 ENCOUNTER — Ambulatory Visit: Payer: 59 | Admitting: Obstetrics and Gynecology

## 2019-11-22 ENCOUNTER — Encounter: Payer: Self-pay | Admitting: Obstetrics and Gynecology

## 2019-11-22 VITALS — BP 110/60 | HR 69 | Ht 63.0 in | Wt 167.8 lb

## 2019-11-22 DIAGNOSIS — Z3009 Encounter for other general counseling and advice on contraception: Secondary | ICD-10-CM

## 2019-11-22 DIAGNOSIS — N946 Dysmenorrhea, unspecified: Secondary | ICD-10-CM

## 2019-11-22 DIAGNOSIS — N943 Premenstrual tension syndrome: Secondary | ICD-10-CM | POA: Diagnosis not present

## 2019-11-22 NOTE — Telephone Encounter (Signed)
Referral sent 

## 2019-11-22 NOTE — Patient Instructions (Addendum)
Premenstrual Syndrome Premenstrual syndrome (PMS) is a group of physical, emotional, and behavioral symptoms that affect women of childbearing age as part of their menstrual cycle. PMS starts 1-2 weeks before the start of a woman's menstrual period and goes away a few days after menstrual bleeding starts. It often happens in a predictable pattern (recurs). PMS may cause other health conditions to become worse, such as asthma, allergies, and migraines. PMS can range from mild to severe. When it is severe, it is called premenstrual dysphoric disorder (PMDD). PMS may interfere with normal daily activities. What are the causes? The cause of this condition is not known, but it seems to be related to hormone changes that happen before menstruation. What are the signs or symptoms? Symptoms of this condition often happen every month. They go away completely after your period starts. Physical symptoms of this condition include:  Bloating.  Breast pain.  Headaches.  Extreme fatigue.  Backaches.  Swelling of the hands and feet.  Weight gain.  Hot flashes. Emotional and behavioral symptoms of this condition include:  Mood swings.  Depression.  Angry outbursts.  Irritability.  Anxiety.  Crying spells.  Food cravings or appetite changes.  Changes in sexual desire.  Confusion.  Aggression.  Social withdrawal.  Poor concentration. How is this diagnosed? This condition may be diagnosed based on a history of your symptoms. This condition is generally diagnosed if symptoms of PMS:  Are present in the 5 days before your period starts.  End within 4 days after your period starts.  Happen at least 3 months in a row.  Interfere with some of your normal activities. Other conditions that can cause some of these symptoms must be ruled out before PMS can be diagnosed. These include depression, anxiety, anemia, and thyroid problems. How is this treated? This condition may be treated  by:  Maintaining a healthy lifestyle. This includes eating a well-balanced diet and exercising regularly.  Taking medicines. Medicines can help relieve symptoms such as cramps, aches, pains, headaches, and breast tenderness. Depending on the severity of the condition, your health care provider may recommend various over-the-counter pain medicines. Follow these instructions at home: Eating and drinking   Eat a well-balanced diet.  Avoid caffeine and alcohol.  Limit the amount of salt and salty foods you eat. This will help reduce bloating.  Drink enough fluid to keep your urine pale yellow.  Take a multivitamin if told to do so by your health care provider. Lifestyle   Do not use any products that contain nicotine or tobacco, such as cigarettes, e-cigarettes, and chewing tobacco. If you need help quitting, ask your health care provider.  Exercise regularly as suggested by your health care provider.  Get enough sleep. For most adults, this is 7-8 hours of sleep each night.  Practice relaxation techniques such as yoga, tai chi, or meditation.  Find healthy ways to manage stress. General instructions   For 2-3 months, write down your symptoms, their severity, and how long they last. This will help your health care provider choose the best treatment for you.  Take over-the-counter and prescription medicines only as told by your health care provider.  If you are using birth control pills (oral contraceptives), use them as told by your health care provider. Contact a health care provider if:  Your symptoms get worse.  You develop new symptoms.  You have trouble doing your daily activities. Summary  Premenstrual syndrome (PMS) is a group of physical, emotional, and behavioral symptoms that   affect women of childbearing age.  PMS starts 1-2 weeks before the start of a woman's period and goes away a few days after the period starts.  PMS is treated by maintaining a healthy  lifestyle and taking medicines to relieve the symptoms. This information is not intended to replace advice given to you by your health care provider. Make sure you discuss any questions you have with your health care provider. Document Revised: 09/03/2017 Document Reviewed: 09/03/2017 Elsevier Patient Education  2020 [employer] Inc[employer]   Laparoscopic Tubal Ligation Laparoscopic tubal ligation is a procedure to close the fallopian tubes. This is done so that you cannot get pregnant. When the fallopian tubes are closed, the eggs that your ovaries release cannot enter the uterus, and sperm cannot reach the released eggs. You should not have this procedure if you want to get pregnant someday or if you are unsure about having more children. Tell a health care provider about:  Any allergies you have.  All medicines you are taking, including vitamins, herbs, eye drops, creams, and over-the-counter medicines.  Any problems you or family members have had with anesthetic medicines.  Any blood disorders you have.  Any surgeries you have had.  Any medical conditions you have.  Whether you are pregnant or may be pregnant.  Any past pregnancies. What are the risks? Generally, this is a safe procedure. However, problems may occur, including:  Infection.  Bleeding.  Injury to other organs in the abdomen.  Side effects from anesthetic medicines.  Failure of the procedure. This procedure can increase your risk of a kind of pregnancy in which a fertilized egg attaches to the outside of the uterus (ectopic pregnancy). What happens before the procedure? Medicines  Ask your health care provider about: ? Changing or stopping your regular medicines. This is especially important if you are taking diabetes medicines or blood thinners. ? Taking medicines such as aspirin and ibuprofen. These medicines can thin your blood. Do not take these medicines unless your health care provider tells you to take  them. ? Taking over-the-counter medicines, vitamins, herbs, and supplements. Staying hydrated  Follow instructions from your health care provider about hydration, which may include: ? Up to 2 hours before the procedure - you may continue to drink clear liquids, such as water, clear fruit juice, black coffee, and plain tea. Eating and drinking  Follow instructions from your health care provider about eating and drinking, which may include: ? 8 hours before the procedure - stop eating heavy meals or foods, such as meat, fried foods, or fatty foods. ? 6 hours before the procedure - stop eating light meals or foods, such as toast or cereal. ? 6 hours before the procedure - stop drinking milk or drinks that contain milk. ? 2 hours before the procedure - stop drinking clear liquids. General instructions  Do not use any products that contain nicotine or tobacco for at least 4 weeks before the procedure. These products include cigarettes, e-cigarettes, and chewing tobacco. If you need help quitting, ask your health care provider.  Plan to have someone take you home from the hospital.  If you will be going home right after the procedure, plan to have someone with you for 24 hours.  Ask your health care provider: ? How your surgery site will be marked. ? What steps will be taken to help prevent infection. These may include:  Removing hair at the surgery site.  Washing skin with a germ-killing soap.  Taking antibiotic medicine. What  happens during the procedure?      An IV will be inserted into one of your veins.  You will be given one or more of the following: ? A medicine to help you relax (sedative). ? A medicine to numb the area (local anesthetic). ? A medicine to make you fall asleep (general anesthetic). ? A medicine that is injected into an area of your body to numb everything below the injection site (regional anesthetic).  Your bladder may be emptied with a small tube  (catheter).  If you have been given a general anesthetic, a tube will be put down your throat to help you breathe.  Two small incisions will be made in your lower abdomen and near your belly button.  Your abdomen will be inflated with a gas. This will let the surgeon see better and will give the surgeon room to work.  A thin, lighted tube (laparoscope) with a camera attached will be inserted into your abdomen through one of the incisions. Small instruments will be inserted through the other incision.  The fallopian tubes will be tied off, burned (cauterized), or blocked with a clip, ring, or clamp. A small portion in the center of each fallopian tube may be removed.  The gas will be released from the abdomen.  The incisions will be closed with stitches (sutures).  A bandage (dressing) will be placed over the incisions. The procedure may vary among health care providers and hospitals. What happens after the procedure?  Your blood pressure, heart rate, breathing rate, and blood oxygen level will be monitored until you leave the hospital.  You will be given medicine to help with pain, nausea, and vomiting as needed. Summary  Laparoscopic tubal ligation is a procedure that is done so that you cannot get pregnant.  You should not have this procedure if you want to get pregnant someday or if you are unsure about having more children.  The procedure is done using a thin, lighted tube (laparoscope) with a camera attached that will be inserted into your abdomen through an incision.  Follow instructions from your health care provider about eating and drinking before the procedure. This information is not intended to replace advice given to you by your health care provider. Make sure you discuss any questions you have with your health care provider. Document Revised: 06/30/2018 Document Reviewed: 12/16/2017 Elsevier Patient Education  2020 [employer].

## 2019-11-22 NOTE — Progress Notes (Signed)
GYNECOLOGY  VISIT   HPI: 34 y.o.   Married White or Caucasian Not Hispanic or Latino  female   [phone] with Patient's last menstrual period was 11/19/2019.   here for sterilization consult. She is sure she doesn't want any more children. She has trouble remembering the pill. Hasn't done well with an IUD in the past. Husband doesn't want to have a vasectomy.  She c/o worsening mood changes and increased anxiety for a couple of days prior to her cycle. She also c/o worsening cramps over time. Still manageable with ibuprofen.    GYNECOLOGIC HISTORY: Patient's last menstrual period was 11/19/2019. Contraception:none  Menopausal hormone therapy: none         OB History    Gravida  2   Para  2   Term  1   Preterm  0   AB  0   Living  2     SAB  0   TAB  0   Ectopic  0   Multiple  0   Live Births  2              Patient Active Problem List   Diagnosis Date Noted  . Oral contraceptive use 01/04/2019  . Attention deficit hyperactivity disorder (ADHD), predominantly inattentive type 04/14/2018  . Chronic fatigue 04/14/2018  . 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 Sjogren's syndrome ([place]) 08/29/2016  . Chronic migraine without aura without status migrainosus, not intractable 06/10/2015  . Anxiety     Past Medical History:  Diagnosis Date  . ADD (attention deficit disorder with hyperactivity)   . Adverse effect of unspecified general anesthetics, subsequent encounter    difficulty waking up from anesthesia  . Anxiety   . Family history of anesthesia complication    N&V  . Migraines   . PONV (postoperative nausea and vomiting)   . Sjogren's disease [facility])     Past Surgical History:  Procedure Laterality Date  . BREAST ENHANCEMENT SURGERY  2013   Dr. Lenon Curt  . CHOLECYSTECTOMY N/A 03/07/2016   Procedure: LAPAROSCOPIC CHOLECYSTECTOMY;  Surgeon: Coralie Keens, MD;  Location: [facility];  Service: General;  Laterality: N/A;  . HYSTEROSCOPY N/A  10/15/2013   Procedure: HYSTEROSCOPY with removal of IUD;  Surgeon: Lyman Speller, MD;  Location: [facility];  Service: Gynecology;  Laterality: N/A;  . INTRAUTERINE DEVICE (IUD) INSERTION  05/2017   Mirena   . WISDOM TOOTH EXTRACTION Bilateral 2008    Current Outpatient Medications  Medication Sig Dispense Refill  . ALPRAZolam (XANAX) 0.5 MG tablet Take 1 tablet (0.5 mg total) by mouth at bedtime as needed for anxiety. 30 tablet 2   No current facility-administered medications for this visit.     ALLERGIES: Penicillins  Family History  Problem Relation Age of Onset  . Sarcoidosis Mother   . Diabetes Mother   . Diabetes Father   . Diabetes Brother   . Healthy Son   . Heart disease Maternal Grandfather   . Diabetes Paternal Grandfather   . Heart disease Paternal Grandfather   . Healthy Daughter   . Healthy Son     Social History   Socioeconomic History  . Marital status: Married    Spouse name: Not on file  . Number of children: 2  . Years of education: Not on file  . Highest education level: Some college, no degree  Occupational History  . 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 Occupation: working for [employer]  Tobacco Use  . Smoking status: Never Smoker  . Smokeless tobacco: Never Used  Vaping Use  . Vaping Use: Never used  Substance and Sexual Activity  . Alcohol use: No    Alcohol/week: 0.0 standard drinks  . Drug use: Never  . Sexual activity: Yes    Partners: Male    Birth control/protection: Condom  Other Topics Concern  . Not on file  Social History Narrative   Lives at home with her husband and children   Right handed   Caffeine: none   Social Determinants of Health   Financial Resource Strain:   . Difficulty of Paying Living Expenses: Not on file  Food Insecurity:   . Worried About Charity fundraiser in the Last Year: Not on file  . Ran Out of Food in  the Last Year: Not on file  Transportation Needs:   . Lack of Transportation (Medical): Not on file  . Lack of Transportation (Non-Medical): Not on file  Physical Activity:   . Days of Exercise per Week: Not on file  . Minutes of Exercise per Session: Not on file  Stress:   . Feeling of Stress : Not on file  Social Connections:   . Frequency of Communication with Friends and Family: Not on file  . Frequency of Social Gatherings with Friends and Family: Not on file  . Attends Religious Services: Not on file  . Active Member of Clubs or Organizations: Not on file  . Attends Archivist Meetings: Not on file  . Marital Status: Not on file  Intimate Partner Violence:   . Fear of Current or Ex-Partner: Not on file  . Emotionally Abused: Not on file  . Physically Abused: Not on file  . Sexually Abused: Not on file    Review of Systems  All other systems reviewed and are negative.   PHYSICAL EXAMINATION:    BP 110/60   Pulse 69   Ht 5\' 3"  (1.6 m)   Wt 167 lb 12.8 oz (76.1 kg)   LMP 11/19/2019   SpO2 98%   BMI 29.72 kg/m     General appearance: alert, cooperative and appears stated age Neck: no adenopathy, supple, symmetrical, trachea midline and thyroid normal to inspection and palpation Heart: regular rate and rhythm Lungs: CTAB Abdomen: soft, non-tender; bowel sounds normal; no masses,  no organomegaly Extremities: normal, atraumatic, no cyanosis Skin: normal color, texture and turgor, no rashes or lesions Lymph: normal cervical supraclavicular and inguinal nodes Neurologic: grossly normal   ASSESSMENT Contraception, desires sterilization. Aware of options of OCP's, IUD's, nexplanon and depo-provera. She is sure she doesn't want to have any more children.  Dysmenorrhea, worsening but tolerable PMS    PLAN Laparoscopic bilateral salpingectomies, discussed surgery. Reviewed risks, including but not limited to: damage to bowel/bladder/vessels/ureters. Declines  prescription ibuprofen Discussed option of OCP's or SSRI's for PMS, she will consider SSRI.

## 2019-12-10 ENCOUNTER — Telehealth: Payer: Self-pay

## 2019-12-10 NOTE — Telephone Encounter (Signed)
Spoke with patient. Patient has questions regarding benefit discrepancies. Advised will review further and someone will return call to discuss.

## 2019-12-17 NOTE — Telephone Encounter (Signed)
Spoke with [employer] and confirmed already verified benefits.  Call to patient. Patient stated that she is considering [phone], tubal ligation, depending on insurance benefits. Patient stated that she would return call to office after speaking with insurance company.  Dr. Talbert Nan, if patient is agreeable, is it appropriate to proceed with tubal ligation?   Cc: Gay Filler, RN.

## 2019-12-21 NOTE — Telephone Encounter (Signed)
Call placed to patient to confirm surgery date and review pre-op instructions, patient will return call to office later this afternoon.

## 2019-12-22 NOTE — Telephone Encounter (Signed)
Patient is returning call.  °

## 2019-12-23 ENCOUNTER — Encounter (HOSPITAL_BASED_OUTPATIENT_CLINIC_OR_DEPARTMENT_OTHER): Payer: Self-pay | Admitting: Obstetrics and Gynecology

## 2019-12-23 ENCOUNTER — Other Ambulatory Visit: Payer: Self-pay

## 2019-12-23 NOTE — Progress Notes (Signed)
Spoke w/ via phone for pre-op interview--- PT Lab needs dos----  Urine preg (per anes)/ pre-op orders pending             Lab results------ no COVID test ------ 01-01-2020 @ 1120 Arrive at ------- 0530 NPO after MN  Medications to take morning of surgery ----- Pepcid Diabetic medication ----- n/a Patient Special Instructions ----- n/a Pre-Op special Istructions ----- sent inbox message to dr Talbert Nan in epic Patient verbalized understanding of instructions that were given at this phone interview. Patient denies shortness of breath, chest pain, fever, cough at this phone interview.

## 2019-12-23 NOTE — Telephone Encounter (Signed)
Call to patient. Surgery date request confirmed.  Advised surgery is scheduled for Tuesday, 01/04/20 at 0730 at [facility].  Surgery instruction sheet and hospital brochure reviewed, printed copy to be picked up at pre-op visit on 11/24.  Patient advised of Covid screening and quarantine requirements and agreeable.   Routing to provider for final review. Patient is agreeable to disposition. Will close encounter.

## 2019-12-27 ENCOUNTER — Ambulatory Visit: Payer: Self-pay | Admitting: Obstetrics and Gynecology

## 2019-12-28 NOTE — H&P (View-Only) (Signed)
GYNECOLOGY  VISIT   HPI: 34 y.o.   Married White or Caucasian Not Hispanic or Latino  female   [phone] with Patient's last menstrual period was 12/20/2019 (exact date).   here for pre op for laparoscopic bilateral salpingectomies. She desires permanent sterilization.    GYNECOLOGIC HISTORY: Patient's last menstrual period was 12/20/2019 (exact date). Contraception: none Menopausal hormone therapy: none         OB History    Gravida  2   Para  2   Term  1   Preterm  0   AB  0   Living  2     SAB  0   TAB  0   Ectopic  0   Multiple  0   Live Births  2              Patient Active Problem List   Diagnosis Date Noted  . Oral contraceptive use 01/04/2019  . Attention deficit hyperactivity disorder (ADHD), predominantly inattentive type 04/14/2018  . Chronic fatigue 04/14/2018  . 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 Sjogren's syndrome ([place]) 08/29/2016  . Chronic migraine without aura without status migrainosus, not intractable 06/10/2015  . Anxiety     Past Medical History:  Diagnosis Date  . ADD (attention deficit disorder with hyperactivity)   . Adverse effect of unspecified general anesthetics, subsequent encounter    difficulty waking up from anesthesia  . Family history of anesthesia complication    mother---  hard to wake  . GAD (generalized anxiety disorder)   . GERD (gastroesophageal reflux disease)   . Hypermobility arthralgia    followed by dr Estanislado Pandy---    . Migraines    neurologist--- dr Jaynee Eagles  . PONV (postoperative nausea and vomiting)   . Sjogren's disease ([place])    rheumotologist-- dr s. Estanislado Pandy---  chronic sicca symptoms    Past Surgical History:  Procedure Laterality Date  . BREAST ENHANCEMENT SURGERY  2013   Dr. Lenon Curt  . CHOLECYSTECTOMY N/A 03/07/2016   Procedure: LAPAROSCOPIC CHOLECYSTECTOMY;  Surgeon: Coralie Keens, MD;  Location: [place];  Service: General;  Laterality: N/A;  . HYSTEROSCOPY N/A 10/15/2013    Procedure: HYSTEROSCOPY with removal of IUD;  Surgeon: Lyman Speller, MD;  Location: [facility];  Service: Gynecology;  Laterality: N/A;  . WISDOM TOOTH EXTRACTION Bilateral 2008    Current Outpatient Medications  Medication Sig Dispense Refill  . famotidine (PEPCID) 20 MG tablet Take 20 mg by mouth as needed for heartburn or indigestion. (Patient not taking: Reported on 12/29/2019)    . ketorolac (TORADOL) 10 MG tablet Take 10 mg by mouth every 6 (six) hours as needed (migraines). (Patient not taking: Reported on 12/29/2019)     No current facility-administered medications for this visit.     ALLERGIES: Penicillins  Family History  Problem Relation Age of Onset  . Sarcoidosis Mother   . Diabetes Mother   . Diabetes Father   . Diabetes Brother   . Healthy Son   . Heart disease Maternal Grandfather   . Diabetes Paternal Grandfather   . Heart disease Paternal Grandfather   . Healthy Daughter   . Healthy Son     Social History   Socioeconomic History  . Marital status: Married    Spouse name: Not on file  . Number of children: 2  . Years of education: Not on file  . Highest education level: Some college, no degree  Occupational History  . Occupation: Environmental education officer: [employer]    Employer: [employer]    Comment: ended 12/2018  . Occupation: working for [employer]  Tobacco Use  . Smoking status: Never Smoker  . Smokeless tobacco: Never Used  Vaping Use  . Vaping Use: Never used  Substance and Sexual Activity  . Alcohol use: No    Alcohol/week: 0.0 standard drinks  . Drug use: Never  . Sexual activity: Yes    Partners: Male    Birth control/protection: Condom  Other Topics Concern  . Not on file  Social History Narrative   Lives at home with her husband and children   Right handed   Caffeine: none   Social Determinants of Health   Financial Resource Strain:   . Difficulty of Paying Living Expenses: Not on file  Food  Insecurity:   . Worried About Charity fundraiser in the Last Year: Not on file  . Ran Out of Food in the Last Year: Not on file  Transportation Needs:   . Lack of Transportation (Medical): Not on file  . Lack of Transportation (Non-Medical): Not on file  Physical Activity:   . Days of Exercise per Week: Not on file  . Minutes of Exercise per Session: Not on file  Stress:   . Feeling of Stress : Not on file  Social Connections:   . Frequency of Communication with Friends and Family: Not on file  . Frequency of Social Gatherings with Friends and Family: Not on file  . Attends Religious Services: Not on file  . Active Member of Clubs or Organizations: Not on file  . Attends Archivist Meetings: Not on file  . Marital Status: Not on file  Intimate Partner Violence:   . Fear of Current or Ex-Partner: Not on file  . Emotionally Abused: Not on file  . Physically Abused: Not on file  . Sexually Abused: Not on file    Review of Systems  All other systems reviewed and are negative.   PHYSICAL EXAMINATION:    BP 122/68   Pulse 66   Ht 5\' 3"  (1.6 m)   Wt 168 lb 3.2 oz (76.3 kg)   LMP 12/20/2019 (Exact Date)   SpO2 99%   BMI 29.80 kg/m     General appearance: alert, cooperative and appears stated age Neck: no adenopathy, supple, symmetrical, trachea midline and thyroid normal to inspection and palpation Heart: regular rate and rhythm Lungs: CTAB Abdomen: soft, non-tender; bowel sounds normal; no masses,  no organomegaly Extremities: normal, atraumatic, no cyanosis Skin: normal color, texture and turgor, no rashes or lesions Lymph: normal cervical supraclavicular and inguinal nodes Neurologic: grossly normal    ASSESSMENT Desires sterilization, aware of options for contraception      PLAN Plan laparoscopic bilateral salpingectomies Reviewed risks, including: bleeding, infection, damage to bowel/bladder/ureters, need for further surgery

## 2019-12-28 NOTE — Progress Notes (Signed)
GYNECOLOGY  VISIT   HPI: 34 y.o.   Married White or Caucasian Not Hispanic or Latino  female   [phone] with Patient's last menstrual period was 12/20/2019 (exact date).   here for pre op for laparoscopic bilateral salpingectomies. She desires permanent sterilization.    GYNECOLOGIC HISTORY: Patient's last menstrual period was 12/20/2019 (exact date). Contraception: none Menopausal hormone therapy: none         OB History    Gravida  2   Para  2   Term  1   Preterm  0   AB  0   Living  2     SAB  0   TAB  0   Ectopic  0   Multiple  0   Live Births  2              Patient Active Problem List   Diagnosis Date Noted  . Oral contraceptive use 01/04/2019  . Attention deficit hyperactivity disorder (ADHD), predominantly inattentive type 04/14/2018  . Chronic fatigue 04/14/2018  . 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 Sjogren's syndrome ([place]) 08/29/2016  . Chronic migraine without aura without status migrainosus, not intractable 06/10/2015  . Anxiety     Past Medical History:  Diagnosis Date  . ADD (attention deficit disorder with hyperactivity)   . Adverse effect of unspecified general anesthetics, subsequent encounter    difficulty waking up from anesthesia  . Family history of anesthesia complication    mother---  hard to wake  . GAD (generalized anxiety disorder)   . GERD (gastroesophageal reflux disease)   . Hypermobility arthralgia    followed by dr Estanislado Pandy---    . Migraines    neurologist--- dr Jaynee Eagles  . PONV (postoperative nausea and vomiting)   . Sjogren's disease ([place])    rheumotologist-- dr s. Estanislado Pandy---  chronic sicca symptoms    Past Surgical History:  Procedure Laterality Date  . BREAST ENHANCEMENT SURGERY  2013   Dr. Lenon Curt  . CHOLECYSTECTOMY N/A 03/07/2016   Procedure: LAPAROSCOPIC CHOLECYSTECTOMY;  Surgeon: Coralie Keens, MD;  Location: [place];  Service: General;  Laterality: N/A;  . HYSTEROSCOPY N/A 10/15/2013    Procedure: HYSTEROSCOPY with removal of IUD;  Surgeon: Lyman Speller, MD;  Location: [facility];  Service: Gynecology;  Laterality: N/A;  . WISDOM TOOTH EXTRACTION Bilateral 2008    Current Outpatient Medications  Medication Sig Dispense Refill  . famotidine (PEPCID) 20 MG tablet Take 20 mg by mouth as needed for heartburn or indigestion. (Patient not taking: Reported on 12/29/2019)    . ketorolac (TORADOL) 10 MG tablet Take 10 mg by mouth every 6 (six) hours as needed (migraines). (Patient not taking: Reported on 12/29/2019)     No current facility-administered medications for this visit.     ALLERGIES: Penicillins  Family History  Problem Relation Age of Onset  . Sarcoidosis Mother   . Diabetes Mother   . Diabetes Father   . Diabetes Brother   . Healthy Son   . Heart disease Maternal Grandfather   . Diabetes Paternal Grandfather   . Heart disease Paternal Grandfather   . Healthy Daughter   . Healthy Son     Social History   Socioeconomic History  . Marital status: Married    Spouse name: Not on file  . Number of children: 2  . Years of education: Not on file  . Highest education level: Some college, no degree  Occupational History  . 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 Occupation: working for [employer]  Tobacco Use  . Smoking status: Never Smoker  . Smokeless tobacco: Never Used  Vaping Use  . Vaping Use: Never used  Substance and Sexual Activity  . Alcohol use: No    Alcohol/week: 0.0 standard drinks  . Drug use: Never  . Sexual activity: Yes    Partners: Male    Birth control/protection: Condom  Other Topics Concern  . Not on file  Social History Narrative   Lives at home with her husband and children   Right handed   Caffeine: none   Social Determinants of Health   Financial Resource Strain:   . Difficulty of Paying Living Expenses: Not on file  Food  Insecurity:   . Worried About Charity fundraiser in the Last Year: Not on file  . Ran Out of Food in the Last Year: Not on file  Transportation Needs:   . Lack of Transportation (Medical): Not on file  . Lack of Transportation (Non-Medical): Not on file  Physical Activity:   . Days of Exercise per Week: Not on file  . Minutes of Exercise per Session: Not on file  Stress:   . Feeling of Stress : Not on file  Social Connections:   . Frequency of Communication with Friends and Family: Not on file  . Frequency of Social Gatherings with Friends and Family: Not on file  . Attends Religious Services: Not on file  . Active Member of Clubs or Organizations: Not on file  . Attends Archivist Meetings: Not on file  . Marital Status: Not on file  Intimate Partner Violence:   . Fear of Current or Ex-Partner: Not on file  . Emotionally Abused: Not on file  . Physically Abused: Not on file  . Sexually Abused: Not on file    Review of Systems  All other systems reviewed and are negative.   PHYSICAL EXAMINATION:    BP 122/68   Pulse 66   Ht 5\' 3"  (1.6 m)   Wt 168 lb 3.2 oz (76.3 kg)   LMP 12/20/2019 (Exact Date)   SpO2 99%   BMI 29.80 kg/m     General appearance: alert, cooperative and appears stated age Neck: no adenopathy, supple, symmetrical, trachea midline and thyroid normal to inspection and palpation Heart: regular rate and rhythm Lungs: CTAB Abdomen: soft, non-tender; bowel sounds normal; no masses,  no organomegaly Extremities: normal, atraumatic, no cyanosis Skin: normal color, texture and turgor, no rashes or lesions Lymph: normal cervical supraclavicular and inguinal nodes Neurologic: grossly normal    ASSESSMENT Desires sterilization, aware of options for contraception      PLAN Plan laparoscopic bilateral salpingectomies Reviewed risks, including: bleeding, infection, damage to bowel/bladder/ureters, need for further surgery

## 2019-12-29 ENCOUNTER — Ambulatory Visit (INDEPENDENT_AMBULATORY_CARE_PROVIDER_SITE_OTHER): Payer: 59 | Admitting: Obstetrics and Gynecology

## 2019-12-29 ENCOUNTER — Encounter: Payer: Self-pay | Admitting: Obstetrics and Gynecology

## 2019-12-29 ENCOUNTER — Other Ambulatory Visit: Payer: Self-pay

## 2019-12-29 VITALS — BP 122/68 | HR 66 | Ht 63.0 in | Wt 168.2 lb

## 2019-12-29 DIAGNOSIS — Z3009 Encounter for other general counseling and advice on contraception: Secondary | ICD-10-CM

## 2020-01-01 ENCOUNTER — Other Ambulatory Visit (HOSPITAL_COMMUNITY)
Admission: RE | Admit: 2020-01-01 | Discharge: 2020-01-01 | Disposition: A | Discharge status: Home / Self Care | Payer: 59 | Source: Ambulatory Visit | Attending: Obstetrics and Gynecology | Admitting: Obstetrics and Gynecology

## 2020-01-01 DIAGNOSIS — Z01812 Encounter for preprocedural laboratory examination: Secondary | ICD-10-CM | POA: Insufficient documentation

## 2020-01-01 DIAGNOSIS — Z20822 Contact with and (suspected) exposure to covid-19: Secondary | ICD-10-CM | POA: Diagnosis not present

## 2020-01-02 LAB — SARS CORONAVIRUS 2 (TAT 6-24 HRS): SARS Coronavirus 2: NEGATIVE

## 2020-01-03 NOTE — Anesthesia Preprocedure Evaluation (Addendum)
Anesthesia Evaluation  Patient identified by MRN, date of birth, ID band Patient awake    Reviewed: Allergy & Precautions, NPO status , Patient's Chart, lab work & pertinent test results  History of Anesthesia Complications (+) PONV  Airway Mallampati: II  TM Distance: >3 FB Neck ROM: Full    Dental no notable dental hx. (+) Teeth Intact, Dental Advisory Given   Pulmonary neg pulmonary ROS,    Pulmonary exam normal breath sounds clear to auscultation       Cardiovascular Exercise Tolerance: Good negative cardio ROS Normal cardiovascular exam Rhythm:Regular Rate:Normal     Neuro/Psych  Headaches, PSYCHIATRIC DISORDERS Anxiety    GI/Hepatic Neg liver ROS, GERD  ,  Endo/Other  negative endocrine ROS  Renal/GU negative Renal ROS     Musculoskeletal negative musculoskeletal ROS (+)   Abdominal   Peds  Hematology   Anesthesia Other Findings   Reproductive/Obstetrics negative OB ROS                            Anesthesia Physical Anesthesia Plan  ASA: I  Anesthesia Plan: General   Post-op Pain Management:    Induction: Intravenous  PONV Risk Score and Plan: 4 or greater and Treatment may vary due to age or medical condition, Ondansetron, Dexamethasone and Midazolam  Airway Management Planned: Oral ETT  Additional Equipment: None  Intra-op Plan:   Post-operative Plan: Extubation in OR  Informed Consent: I have reviewed the patients History and Physical, chart, labs and discussed the procedure including the risks, benefits and alternatives for the proposed anesthesia with the patient or authorized representative who has indicated his/her understanding and acceptance.     Dental advisory given  Plan Discussed with: CRNA and Anesthesiologist  Anesthesia Plan Comments:        Anesthesia Quick Evaluation

## 2020-01-04 ENCOUNTER — Ambulatory Visit (HOSPITAL_BASED_OUTPATIENT_CLINIC_OR_DEPARTMENT_OTHER): Payer: 59 | Admitting: Anesthesiology

## 2020-01-04 ENCOUNTER — Other Ambulatory Visit (HOSPITAL_BASED_OUTPATIENT_CLINIC_OR_DEPARTMENT_OTHER): Payer: Self-pay | Admitting: Obstetrics and Gynecology

## 2020-01-04 ENCOUNTER — Encounter (HOSPITAL_BASED_OUTPATIENT_CLINIC_OR_DEPARTMENT_OTHER)
Admission: RE | Disposition: A | Discharge status: Home / Self Care | Payer: Self-pay | Source: Home / Self Care | Attending: Obstetrics and Gynecology

## 2020-01-04 ENCOUNTER — Ambulatory Visit (HOSPITAL_BASED_OUTPATIENT_CLINIC_OR_DEPARTMENT_OTHER)
Admission: RE | Admit: 2020-01-04 | Discharge: 2020-01-04 | Disposition: A | Discharge status: Home / Self Care | Payer: 59 | Attending: Obstetrics and Gynecology | Admitting: Obstetrics and Gynecology

## 2020-01-04 ENCOUNTER — Encounter (HOSPITAL_BASED_OUTPATIENT_CLINIC_OR_DEPARTMENT_OTHER): Payer: Self-pay | Admitting: Obstetrics and Gynecology

## 2020-01-04 DIAGNOSIS — Z302 Encounter for sterilization: Secondary | ICD-10-CM

## 2020-01-04 DIAGNOSIS — Z880 Allergy status to penicillin: Secondary | ICD-10-CM | POA: Diagnosis not present

## 2020-01-04 DIAGNOSIS — N838 Other noninflammatory disorders of ovary, fallopian tube and broad ligament: Secondary | ICD-10-CM

## 2020-01-04 HISTORY — PX: LAPAROSCOPIC BILATERAL SALPINGECTOMY: SHX5889

## 2020-01-04 HISTORY — DX: Pain in unspecified joint: M25.50

## 2020-01-04 HISTORY — DX: Gastro-esophageal reflux disease without esophagitis: K21.9

## 2020-01-04 HISTORY — DX: Generalized anxiety disorder: F41.1

## 2020-01-04 LAB — POCT PREGNANCY, URINE: Preg Test, Ur: NEGATIVE

## 2020-01-04 SURGERY — SALPINGECTOMY, BILATERAL, LAPAROSCOPIC
Anesthesia: General | Site: Abdomen | Laterality: Bilateral

## 2020-01-04 MED ORDER — ROCURONIUM BROMIDE 10 MG/ML (PF) SYRINGE
PREFILLED_SYRINGE | INTRAVENOUS | Status: AC
  Filled 2020-01-04: qty 10

## 2020-01-04 MED ORDER — FENTANYL CITRATE (PF) 100 MCG/2ML IJ SOLN
INTRAMUSCULAR | Status: AC
  Filled 2020-01-04: qty 2

## 2020-01-04 MED ORDER — AMISULPRIDE (ANTIEMETIC) 5 MG/2ML IV SOLN
10.0000 mg | Freq: Once | INTRAVENOUS | Status: AC | PRN
  Administered 2020-01-04: 10 mg via INTRAVENOUS

## 2020-01-04 MED ORDER — HYDROMORPHONE HCL 1 MG/ML IJ SOLN
0.2500 mg | INTRAMUSCULAR | Status: DC | PRN
  Administered 2020-01-04: 0.5 mg via INTRAVENOUS

## 2020-01-04 MED ORDER — LIDOCAINE HCL (PF) 2 % IJ SOLN
INTRAMUSCULAR | Status: AC
  Filled 2020-01-04: qty 5

## 2020-01-04 MED ORDER — IBUPROFEN 800 MG PO TABS: 800.0000 mg | ORAL_TABLET | Freq: Three times a day (TID) | ORAL | 0 refills | Status: DC | PRN

## 2020-01-04 MED ORDER — DEXAMETHASONE SODIUM PHOSPHATE 4 MG/ML IJ SOLN
INTRAMUSCULAR | Status: DC | PRN
  Administered 2020-01-04: 10 mg via INTRAVENOUS

## 2020-01-04 MED ORDER — DEXAMETHASONE SODIUM PHOSPHATE 10 MG/ML IJ SOLN
INTRAMUSCULAR | Status: AC
  Filled 2020-01-04: qty 1

## 2020-01-04 MED ORDER — BUPIVACAINE HCL 0.25 % IJ SOLN
INTRAMUSCULAR | Status: DC | PRN
  Administered 2020-01-04: 6 mL

## 2020-01-04 MED ORDER — PROPOFOL 10 MG/ML IV BOLUS
INTRAVENOUS | Status: AC
  Filled 2020-01-04: qty 40

## 2020-01-04 MED ORDER — LIDOCAINE HCL (CARDIAC) PF 100 MG/5ML IV SOSY
PREFILLED_SYRINGE | INTRAVENOUS | Status: DC | PRN
  Administered 2020-01-04: 80 mg via INTRAVENOUS

## 2020-01-04 MED ORDER — OXYCODONE HCL 5 MG PO TABS: 5.0000 mg | ORAL_TABLET | Freq: Four times a day (QID) | ORAL | 0 refills | Status: DC | PRN

## 2020-01-04 MED ORDER — ONDANSETRON HCL 4 MG/2ML IJ SOLN
INTRAMUSCULAR | Status: AC
  Filled 2020-01-04: qty 2

## 2020-01-04 MED ORDER — SUGAMMADEX SODIUM 200 MG/2ML IV SOLN
INTRAVENOUS | Status: DC | PRN
  Administered 2020-01-04: 200 mg via INTRAVENOUS

## 2020-01-04 MED ORDER — FENTANYL CITRATE (PF) 100 MCG/2ML IJ SOLN
INTRAMUSCULAR | Status: DC | PRN
  Administered 2020-01-04 (×2): 50 ug via INTRAVENOUS

## 2020-01-04 MED ORDER — HYDROMORPHONE HCL 1 MG/ML IJ SOLN
INTRAMUSCULAR | Status: AC
  Filled 2020-01-04: qty 1

## 2020-01-04 MED ORDER — KETOROLAC TROMETHAMINE 30 MG/ML IJ SOLN: 30.0000 mg | Freq: Once | INTRAMUSCULAR | Status: DC | PRN

## 2020-01-04 MED ORDER — PROPOFOL 10 MG/ML IV BOLUS
INTRAVENOUS | Status: DC | PRN
  Administered 2020-01-04: 50 mg via INTRAVENOUS
  Administered 2020-01-04: 150 mg via INTRAVENOUS

## 2020-01-04 MED ORDER — DEXMEDETOMIDINE HCL 200 MCG/2ML IV SOLN
INTRAVENOUS | Status: DC | PRN
  Administered 2020-01-04 (×2): 8 ug via INTRAVENOUS

## 2020-01-04 MED ORDER — PHENYLEPHRINE 40 MCG/ML (10ML) SYRINGE FOR IV PUSH (FOR BLOOD PRESSURE SUPPORT)
PREFILLED_SYRINGE | INTRAVENOUS | Status: AC
  Filled 2020-01-04: qty 10

## 2020-01-04 MED ORDER — OXYCODONE HCL 5 MG PO TABS: 5.0000 mg | ORAL_TABLET | Freq: Once | ORAL | Status: DC | PRN

## 2020-01-04 MED ORDER — ONDANSETRON HCL 4 MG/2ML IJ SOLN: 4.0000 mg | Freq: Once | INTRAMUSCULAR | Status: DC | PRN

## 2020-01-04 MED ORDER — AMISULPRIDE (ANTIEMETIC) 5 MG/2ML IV SOLN
INTRAVENOUS | Status: AC
  Filled 2020-01-04: qty 4

## 2020-01-04 MED ORDER — MIDAZOLAM HCL 5 MG/5ML IJ SOLN
INTRAMUSCULAR | Status: DC | PRN
  Administered 2020-01-04: 2 mg via INTRAVENOUS

## 2020-01-04 MED ORDER — PHENYLEPHRINE 40 MCG/ML (10ML) SYRINGE FOR IV PUSH (FOR BLOOD PRESSURE SUPPORT)
PREFILLED_SYRINGE | INTRAVENOUS | Status: DC | PRN
  Administered 2020-01-04: 80 ug via INTRAVENOUS

## 2020-01-04 MED ORDER — LACTATED RINGERS IV SOLN: INTRAVENOUS | Status: DC

## 2020-01-04 MED ORDER — DEXMEDETOMIDINE (PRECEDEX) IN NS 20 MCG/5ML (4 MCG/ML) IV SYRINGE
PREFILLED_SYRINGE | INTRAVENOUS | Status: AC
  Filled 2020-01-04: qty 5

## 2020-01-04 MED ORDER — ONDANSETRON HCL 4 MG/2ML IJ SOLN
INTRAMUSCULAR | Status: DC | PRN
  Administered 2020-01-04: 4 mg via INTRAVENOUS

## 2020-01-04 MED ORDER — ROCURONIUM BROMIDE 100 MG/10ML IV SOLN
INTRAVENOUS | Status: DC | PRN
  Administered 2020-01-04: 50 mg via INTRAVENOUS

## 2020-01-04 MED ORDER — OXYCODONE HCL 5 MG/5ML PO SOLN: 5.0000 mg | Freq: Once | ORAL | Status: DC | PRN

## 2020-01-04 MED ORDER — KETOROLAC TROMETHAMINE 30 MG/ML IJ SOLN
INTRAMUSCULAR | Status: DC | PRN
  Administered 2020-01-04: 30 mg via INTRAVENOUS

## 2020-01-04 MED ORDER — MIDAZOLAM HCL 2 MG/2ML IJ SOLN
INTRAMUSCULAR | Status: AC
  Filled 2020-01-04: qty 2

## 2020-01-04 MED FILL — oxyCODONE HCL 5 MG TABS: 5 | 3 days supply | Qty: 15 | Fill #0

## 2020-01-04 MED FILL — IBUPROFEN 800 MG TAB: 800 | 10 days supply | Qty: 30 | Fill #0

## 2020-01-04 SURGICAL SUPPLY — 44 items
ADH SKN CLS APL DERMABOND .7 (GAUZE/BANDAGES/DRESSINGS) ×1
APL SRG 38 LTWT LNG FL B (MISCELLANEOUS)
APPLICATOR ARISTA FLEXITIP XL (MISCELLANEOUS) IMPLANT
BAG RETRIEVAL 10 (BASKET)
CABLE HIGH FREQUENCY MONO STRZ (ELECTRODE) IMPLANT
CANISTER SUCT 3000ML PPV (MISCELLANEOUS) ×2 IMPLANT
COVER MAYO STAND STRL (DRAPES) ×2 IMPLANT
COVER WAND RF STERILE (DRAPES) ×2 IMPLANT
DERMABOND ADVANCED (GAUZE/BANDAGES/DRESSINGS) ×1
DERMABOND ADVANCED .7 DNX12 (GAUZE/BANDAGES/DRESSINGS) ×1 IMPLANT
DISSECTOR BLUNT TIP ENDO 5MM (MISCELLANEOUS) IMPLANT
DRSG OPSITE POSTOP 3X4 (GAUZE/BANDAGES/DRESSINGS) IMPLANT
DURAPREP 26ML APPLICATOR (WOUND CARE) ×2 IMPLANT
ELECT REM PT RETURN 9FT ADLT (ELECTROSURGICAL) ×2
ELECTRODE REM PT RTRN 9FT ADLT (ELECTROSURGICAL) ×1 IMPLANT
GAUZE 4X4 16PLY RFD (DISPOSABLE) ×2 IMPLANT
GLOVE BIO SURGEON STRL SZ 6.5 (GLOVE) ×4 IMPLANT
GLOVE BIOGEL PI IND STRL 7.0 (GLOVE) ×2 IMPLANT
GLOVE BIOGEL PI INDICATOR 7.0 (GLOVE) ×2
GOWN STRL REUS W/TWL LRG LVL3 (GOWN DISPOSABLE) ×4 IMPLANT
HEMOSTAT ARISTA ABSORB 3G PWDR (HEMOSTASIS) IMPLANT
KIT TURNOVER CYSTO (KITS) ×2 IMPLANT
LIGASURE VESSEL 5MM BLUNT TIP (ELECTROSURGICAL) ×1 IMPLANT
NEEDLE INSUFFLATION 120MM (ENDOMECHANICALS) ×2 IMPLANT
NS IRRIG 500ML POUR BTL (IV SOLUTION) ×2 IMPLANT
PACK LAPAROSCOPY BASIN (CUSTOM PROCEDURE TRAY) ×2 IMPLANT
PAD OB MATERNITY 4.3X12.25 (PERSONAL CARE ITEMS) ×2 IMPLANT
POUCH LAPAROSCOPIC INSTRUMENT (MISCELLANEOUS) ×2 IMPLANT
SCISSORS LAP 5X35 DISP (ENDOMECHANICALS) IMPLANT
SET IRRIG Y TYPE TUR BLADDER L (SET/KITS/TRAYS/PACK) IMPLANT
SET SUCTION IRRIG HYDROSURG (IRRIGATION / IRRIGATOR) IMPLANT
SET TUBE SMOKE EVAC HIGH FLOW (TUBING) ×2 IMPLANT
SHEARS HARMONIC ACE PLUS 36CM (ENDOMECHANICALS) IMPLANT
SUT VIC AB 4-0 PS2 18 (SUTURE) ×2 IMPLANT
SYS BAG RETRIEVAL 10MM (BASKET)
SYSTEM BAG RETRIEVAL 10MM (BASKET) IMPLANT
SYSTEM CARTER THOMASON II (TROCAR) IMPLANT
TOWEL OR 17X26 10 PK STRL BLUE (TOWEL DISPOSABLE) ×4 IMPLANT
TRAY FOLEY W/BAG SLVR 14FR LF (SET/KITS/TRAYS/PACK) ×2 IMPLANT
TROCAR ADV FIXATION 11X100MM (TROCAR) IMPLANT
TROCAR ADV FIXATION 5X100MM (TROCAR) ×2 IMPLANT
TROCAR BLADELESS OPT 5 100 (ENDOMECHANICALS) ×1 IMPLANT
TROCAR XCEL NON-BLD 11X100MML (ENDOMECHANICALS) IMPLANT
WARMER LAPAROSCOPE (MISCELLANEOUS) ×2 IMPLANT

## 2020-01-04 NOTE — Anesthesia Postprocedure Evaluation (Signed)
Anesthesia Post Note  Patient: Teresa Holden  Procedure(s) Performed: LAPAROSCOPIC BILATERAL SALPINGECTOMY (Bilateral Abdomen)     Patient location during evaluation: PACU Anesthesia Type: General Level of consciousness: awake and alert Pain management: pain level controlled Vital Signs Assessment: post-procedure vital signs reviewed and stable Respiratory status: spontaneous breathing, nonlabored ventilation, respiratory function stable and patient connected to nasal cannula oxygen Cardiovascular status: blood pressure returned to baseline and stable Postop Assessment: no apparent nausea or vomiting Anesthetic complications: no   No complications documented.  Last Vitals:  Vitals:   01/04/20 0930 01/04/20 1015  BP:  108/60  Pulse:  63  Resp: 16 16  Temp: 36.7 C 36.8 C  SpO2:  99%    Last Pain:  Vitals:   01/04/20 1015  TempSrc:   PainSc: [place]

## 2020-01-04 NOTE — Op Note (Signed)
Preoperative Diagnosis: Desires permanent sterilization  Postoperative Diagnosis: Same   Procedure:  Laparoscopic bilateral salpingectomies  Surgeon: Dr Sumner Boast  Assistant: none  Anesthesia: General  EBL: 5 cc  Fluids: 800 cc  Urine output: 734 cc  Complications: none  Indications for surgery: The patient is a 34 year old female, who desires permanent sterilization.  The patient is aware of the risks and complications involved with the surgery and consent was obtained prior to the procedure.  Findings: EUA: normal sized, anteverted uterus,no adnexal masses. Laparoscopy: normal liver edge, normal uterus and bilateral adnexa. Simple adhesion of the left tube to the bowel, adhesion of omentum to anterior abdominal wall in the RUQ.   Procedure: The patient was taken to the operating room with an IV in placed. She was placed in the dorsal lithotomy position. General anesthesia was administered. She was prepped and draped in the usual sterile fashion for an abdominal, vaginal surgery. A hulka uterine manipulator was placed. A foley catheter was placed.   The umbilicus was everted, injected with 0.25% marcaine and incised with a # 11 blade. 2 towel clips were used to elevated the umbilicus and a veress needle was placed into the abdominal cavity. The abdominal cavity was insufflated with CO2, with normal intraabdominal pressures. After adequate pneumo-insufflation the veress needle was removed and the 5 mm laparoscope was placed into the abdominal cavity using the opti-view trocar. The patient was placed in trendelenburg and the abdominal pelvic cavity was inspected. 2 more trocars were placed. 1 in the left  lower quadrant approximately 3 cm medial to the anterior superior iliac spine, and one approximately 5 cm superior to the pubic symphysis in the midline. These areas were injected with 0.25% marcaine, incised with a #11 blades, and 5 mm trocars were inserted with direct visualization  with the laparoscope. The abdominal pelvic cavity was again inspected.   The tubes were bilaterally removed with the ligasure device, taking care to get the entire tube. The tubes were removed through the 5 mm trocar. There was a paratubal cyst on the right that was intentionally ruptured to allow it to fit through the 5 mm trocar. Hemostasis was excellent.  Pressure was decreased to 8 mm Hg and hemostasis remained excellent. The abdominal cavity was desufflated and the trocars were removed. The skin was closed with subcuticular stiches of 4-0 vicryl and dermabond was placed over the incisions.  The foley catheter was removed.   The patient's abdomen and perineum were cleansed and she was taken out of the dorsal lithotomy position. Upon awakening she was extubated and taken to the recovery room in stable condition. The sponge and instrument counts were correct.

## 2020-01-04 NOTE — Discharge Instructions (Signed)
DISCHARGE INSTRUCTIONS: Laparoscopy  The following instructions have been prepared to help you care for yourself upon your return home today.  Wound care: Marland Kitchen Do not get the incision wet for the first 24 hours. The incision should be kept clean and dry. . The Band-Aids or dressings may be removed the day after surgery. . Should the incision become sore, red, and swollen after the first week, check with your doctor.  Personal hygiene: . Shower the day after your procedure.  Activity and limitations: . Do NOT drive or operate any equipment today. . Do NOT lift anything more than 15 pounds for 2-3 weeks after surgery. . Do NOT rest in bed all day. . Walking is encouraged. Walk each day, starting slowly with 5-minute walks 3 or 4 times a day. Slowly increase the length of your walks. . Walk up and down stairs slowly. . Do NOT do strenuous activities, such as golfing, playing tennis, bowling, running, biking, weight lifting, gardening, mowing, or vacuuming for 2-4 weeks. Ask your doctor when it is okay to start.  Diet: Eat a light meal as desired this evening. You may resume your usual diet tomorrow.  Return to work: This is dependent on the type of work you do. For the most part you can return to a desk job within a week of surgery. If you are more active at work, please discuss this with your doctor.  What to expect after your surgery: You may have a slight burning sensation when you urinate on the first day. You may have a very small amount of blood in the urine. Expect to have a small amount of vaginal discharge/light bleeding for 1-2 weeks. It is not unusual to have abdominal soreness and bruising for up to 2 weeks. You may be tired and need more rest for about 1 week. You may experience shoulder pain for 24-72 hours. Lying flat in bed may relieve it.  Call your doctor for any of the following: . Develop a fever of 100.4 or greater . Inability to urinate 6 hours after discharge from  hospital . Severe pain not relieved by pain medications . Persistent of heavy bleeding at incision site . Redness or swelling around incision site after a week . Increasing nausea or vomiting  Patient Signature________________________________________ Nurse Signature_________________________________________     Post Anesthesia Home Care Instructions  Activity: Get plenty of rest for the remainder of the day. A responsible individual must stay with you for 24 hours following the procedure.  For the next 24 hours, DO NOT: -Drive a car -Paediatric nurse -Drink alcoholic beverages -Take any medication unless instructed by your physician -Make any legal decisions or sign important papers.  Meals: Start with liquid foods such as gelatin or soup. Progress to regular foods as tolerated. Avoid greasy, spicy, heavy foods. If nausea and/or vomiting occur, drink only clear liquids until the nausea and/or vomiting subsides. Call your physician if vomiting continues.  Special Instructions/Symptoms: Your throat may feel dry or sore from the anesthesia or the breathing tube placed in your throat during surgery. If this causes discomfort, gargle with warm salt water. The discomfort should disappear within 24 hours.  If you had a scopolamine patch placed behind your ear for the management of post- operative nausea and/or vomiting:  1. The medication in the patch is effective for 72 hours, after which it should be removed.  Wrap patch in a tissue and discard in the trash. Wash hands thoroughly with soap and water. 2. You  may remove the patch earlier than 72 hours if you experience unpleasant side effects which may include dry mouth, dizziness or visual disturbances. 3. Avoid touching the patch. Wash your hands with soap and water after contact with the patch.       NO IBUPROFEN PRODUCTS (MOTRIN, ADVIL) OR ALEVE UNTIL 2:15 PM TODAY.

## 2020-01-04 NOTE — Anesthesia Procedure Notes (Signed)
Procedure Name: Intubation Date/Time: 01/04/2020 7:44 AM Performed by: Justice Rocher, CRNA Pre-anesthesia Checklist: Patient identified, Emergency Drugs available, Suction available, Patient being monitored and Timeout performed Patient Re-evaluated:Patient Re-evaluated prior to induction Oxygen Delivery Method: Circle system utilized Preoxygenation: Pre-oxygenation with 100% oxygen Induction Type: IV induction Ventilation: Mask ventilation without difficulty Laryngoscope Size: Mac and 3 Grade View: Grade I Tube type: Oral Tube size: 7.0 mm Number of attempts: 1 Airway Equipment and Method: Stylet and Oral airway Placement Confirmation: ETT inserted through vocal cords under direct vision,  positive ETCO2,  breath sounds checked- equal and bilateral and CO2 detector Secured at: 22 cm Tube secured with: Tape Dental Injury: Teeth and Oropharynx as per pre-operative assessment

## 2020-01-04 NOTE — Transfer of Care (Signed)
Immediate Anesthesia Transfer of Care Note  Patient: Teresa Holden  Procedure(s) Performed: Procedure(s) (LRB): LAPAROSCOPIC BILATERAL SALPINGECTOMY (Bilateral)  Patient Location: PACU  Anesthesia Type: General  Level of Consciousness: awake, sedated, patient cooperative and responds to stimulation  Airway & Oxygen Therapy: Patient Spontanous Breathing and Patient connected to [place] 02 and soft FM   Post-op Assessment: Report given to PACU RN, Post -op Vital signs reviewed and stable and Patient moving all extremities  Post vital signs: Reviewed and stable  Complications: No apparent anesthesia complications

## 2020-01-04 NOTE — Interval H&P Note (Signed)
History and Physical Interval Note:  01/04/2020 7:22 AM  Teresa Holden  has presented today for surgery, with the diagnosis of Encounter for permanent sterilization.  The various methods of treatment have been discussed with the patient and family. After consideration of risks, benefits and other options for treatment, the patient has consented to  Procedure(s): LAPAROSCOPIC BILATERAL SALPINGECTOMY (Bilateral) as a surgical intervention.  The patient's history has been reviewed, patient examined, no change in status, stable for surgery.  I have reviewed the patient's chart and labs.  Questions were answered to the patient's satisfaction.     Salvadore Dom

## 2020-01-05 LAB — SURGICAL PATHOLOGY

## 2020-01-06 ENCOUNTER — Encounter (HOSPITAL_BASED_OUTPATIENT_CLINIC_OR_DEPARTMENT_OTHER): Payer: Self-pay | Admitting: Obstetrics and Gynecology

## 2020-01-18 ENCOUNTER — Encounter: Payer: Self-pay | Admitting: Obstetrics and Gynecology

## 2020-01-18 ENCOUNTER — Other Ambulatory Visit: Payer: Self-pay

## 2020-01-18 ENCOUNTER — Ambulatory Visit (INDEPENDENT_AMBULATORY_CARE_PROVIDER_SITE_OTHER): Payer: 59 | Admitting: Obstetrics and Gynecology

## 2020-01-18 VITALS — BP 110/64 | HR 103 | Ht 63.0 in | Wt 169.0 lb

## 2020-01-18 DIAGNOSIS — Z9079 Acquired absence of other genital organ(s): Secondary | ICD-10-CM

## 2020-01-18 NOTE — Progress Notes (Signed)
GYNECOLOGY  VISIT   HPI: 34 y.o.   Married White or Caucasian Not Hispanic or Latino  female   [phone] with Teresa Holden's last menstrual period was 12/20/2019 (exact date).   here for 2 week post op following tubal ligation.  Doing well, no c/o.   GYNECOLOGIC HISTORY: Teresa Holden's last menstrual period was 12/20/2019 (exact date). Contraception: tubal ligation  Menopausal hormone therapy: none         OB History    Gravida  2   Para  2   Term  1   Preterm  0   AB  0   Living  2     SAB  0   IAB  0   Ectopic  0   Multiple  0   Live Births  2              Teresa Holden Active Problem List   Diagnosis Date Noted  . Oral contraceptive use 01/04/2019  . Attention deficit hyperactivity disorder (ADHD), predominantly inattentive type 04/14/2018  . Chronic fatigue 04/14/2018  . 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 Sjogren's syndrome ([place]) 08/29/2016  . Chronic migraine without aura without status migrainosus, not intractable 06/10/2015  . Anxiety     Past Medical History:  Diagnosis Date  . ADD (attention deficit disorder with hyperactivity)   . Adverse effect of unspecified general anesthetics, subsequent encounter    difficulty waking up from anesthesia  . Family history of anesthesia complication    mother---  hard to wake  . GAD (generalized anxiety disorder)   . GERD (gastroesophageal reflux disease)   . Hypermobility arthralgia    followed by dr Estanislado Pandy---    . Migraines    neurologist--- dr Jaynee Eagles  . PONV (postoperative nausea and vomiting)   . Sjogren's disease ([place])    rheumotologist-- dr s. Estanislado Pandy---  chronic sicca symptoms    Past Surgical History:  Procedure Laterality Date  . BREAST ENHANCEMENT SURGERY  2013   Dr. Lenon Curt  . CHOLECYSTECTOMY N/A 03/07/2016   Procedure: LAPAROSCOPIC CHOLECYSTECTOMY;  Surgeon: Coralie Keens, MD;  Location: [facility];  Service: General;  Laterality: N/A;  . HYSTEROSCOPY N/A 10/15/2013   Procedure:  HYSTEROSCOPY with removal of IUD;  Surgeon: Lyman Speller, MD;  Location: [facility];  Service: Gynecology;  Laterality: N/A;  . LAPAROSCOPIC BILATERAL SALPINGECTOMY Bilateral 01/04/2020   Procedure: LAPAROSCOPIC BILATERAL SALPINGECTOMY;  Surgeon: Salvadore Dom, MD;  Location: [facility];  Service: Gynecology;  Laterality: Bilateral;  . WISDOM TOOTH EXTRACTION Bilateral 2008    Current Outpatient Medications  Medication Sig Dispense Refill  . famotidine (PEPCID) 20 MG tablet Take 20 mg by mouth as needed for heartburn or indigestion.      No current facility-administered medications for this visit.     ALLERGIES: Penicillins  Family History  Problem Relation Age of Onset  . Sarcoidosis Mother   . Diabetes Mother   . Diabetes Father   . Diabetes Brother   . Healthy Son   . Heart disease Maternal Grandfather   . Diabetes Paternal Grandfather   . Heart disease Paternal Grandfather   . Healthy Daughter   . Healthy Son     Social History   Socioeconomic History  . Marital status: Married    Spouse name: Not on file  . Number of children: 2  . Years of education: Not on file  . Highest education level: Some college, no degree  Occupational History  . Occupation: Environmental education officer:  [employer]    Employer: [employer]    Comment: ended 12/2018  . Occupation: working for [employer]  Tobacco Use  . Smoking status: Never Smoker  . Smokeless tobacco: Never Used  Vaping Use  . Vaping Use: Never used  Substance and Sexual Activity  . Alcohol use: No    Alcohol/week: 0.0 standard drinks  . Drug use: Never  . Sexual activity: Yes    Partners: Male    Birth control/protection: Condom  Other Topics Concern  . Not on file  Social History Narrative   Lives at home with her husband and children   Right handed   Caffeine: none   Social Determinants of Health   Financial Resource Strain: Not on file  Food Insecurity: Not  on file  Transportation Needs: Not on file  Physical Activity: Not on file  Stress: Not on file  Social Connections: Not on file  Intimate Partner Violence: Not on file    ROS  PHYSICAL EXAMINATION:    BP 110/64   Pulse (!) 103   Ht 5\' 3"  (1.6 m)   Wt 169 lb (76.7 kg)   LMP 12/20/2019 (Exact Date)   SpO2 98%   BMI 29.94 kg/m     General appearance: alert, cooperative and appears stated age Abdomen: soft, non-tender; non distended, no masses,  no organomegaly Incisions: healing well, dermabond removed from umbilical incision.    ASSESSMENT Post op check s/p laparoscopic bilateral salpingectomies, doing well    PLAN Routine f/u

## 2020-03-14 ENCOUNTER — Telehealth: Payer: Self-pay | Admitting: Neurology

## 2020-03-14 NOTE — Telephone Encounter (Signed)
Pt. states she is still having migraines & has been scheduled for an upcoming appt. in April.

## 2020-03-14 NOTE — Telephone Encounter (Signed)
Nevermind, per Chanae, pt asked specifically for Dr Jaynee Eagles.

## 2020-05-04 ENCOUNTER — Telehealth: Payer: Self-pay | Admitting: Neurology

## 2020-05-04 NOTE — Telephone Encounter (Signed)
Patient called and r/s

## 2020-05-22 ENCOUNTER — Ambulatory Visit: Payer: 59 | Admitting: Neurology

## 2020-05-22 ENCOUNTER — Encounter: Payer: Self-pay | Admitting: Neurology

## 2020-05-29 ENCOUNTER — Ambulatory Visit: Payer: 59 | Admitting: Neurology

## 2020-09-09 NOTE — Progress Notes (Signed)
Office Visit Note  Patient: Teresa Holden             Date of Birth: 09/01/1985           MRN: [id number]             PCP: Leamon Arnt, MD Referring: Leamon Arnt, MD Visit Date: 09/13/2020 Occupation: '@GUAROCC'$ @  Subjective:  Joint pain.   History of Present Illness: Teresa Holden is a 35 y.o. female returns today after her last visit on Jun 25, 2018.  She had a televisit on November 05, 2018.  States she discontinued hydroxychloroquine as it was not covered by her insurance.  She continues to have fatigue, dry mouth, dry eyes, joint pain especially in her hands and her feet.  She states 2 weeks ago when she was at the beach she developed a rash on her abdominal area and also in the inner part of her arms and her thighs.  She was seen at the urgent care where she was given a steroid shot.  She states the rash gradually resolved.  The rash was itchy.  Activities of Daily Living:  Patient reports morning stiffness for 15-30 minutes.   Patient Denies nocturnal pain.  Difficulty dressing/grooming: Denies Difficulty climbing stairs: Denies Difficulty getting out of chair: Denies Difficulty using hands for taps, buttons, cutlery, and/or writing: Denies  Review of Systems  Constitutional:  Positive for fatigue.  HENT:  Positive for mouth dryness. Negative for mouth sores and nose dryness.   Eyes:  Positive for itching and dryness. Negative for pain and visual disturbance.  Respiratory:  Negative for cough, hemoptysis, shortness of breath and difficulty breathing.   Cardiovascular:  Negative for chest pain, palpitations and swelling in legs/feet.  Gastrointestinal:  Positive for diarrhea. Negative for abdominal pain, blood in stool and constipation.  Endocrine: Negative for increased urination.  Genitourinary:  Negative for painful urination.  Musculoskeletal:  Positive for joint pain, joint pain and morning stiffness. Negative for joint swelling, myalgias, muscle weakness,  muscle tenderness and myalgias.  Skin:  Positive for rash. Negative for color change, redness and sensitivity to sunlight.  Allergic/Immunologic: Negative for susceptible to infections.  Neurological:  Positive for headaches and weakness. Negative for dizziness, numbness and memory loss.  Hematological:  Negative for swollen glands.  Psychiatric/Behavioral:  Negative for depressed mood, confusion and sleep disturbance. The patient is nervous/anxious.    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   Sjogren's syndrome ([place]) 08/29/2016   Chronic migraine without aura without status migrainosus, not intractable 06/10/2015   Anxiety     Past Medical History:  Diagnosis Date   ADD (attention deficit disorder with hyperactivity)    Adverse effect of unspecified general anesthetics, subsequent encounter    difficulty waking up from anesthesia   Family history of anesthesia complication    mother---  hard to wake   GAD (generalized anxiety disorder)    GERD (gastroesophageal reflux disease)    Hypermobility arthralgia    followed by dr Estanislado Pandy---     Migraines    neurologist--- dr Jaynee Eagles   PONV (postoperative nausea and vomiting)    Sjogren's disease ([place])    rheumotologist-- dr s. Estanislado Pandy---  chronic sicca symptoms    Family History  Problem Relation Age of Onset   Sarcoidosis Mother    Diabetes Mother    Diabetes Father    Diabetes Brother    Healthy Son    Heart disease Maternal Grandfather    Diabetes Paternal Grandfather    Heart disease Paternal Grandfather    Healthy Daughter    Healthy Son    Past Surgical History:  Procedure Laterality Date   BREAST ENHANCEMENT SURGERY  2013   Dr. Lenon Curt    CHOLECYSTECTOMY N/A 03/07/2016   Procedure: LAPAROSCOPIC CHOLECYSTECTOMY;  Surgeon: Coralie Keens, MD;  Location: [facility];  Service: General;  Laterality: N/A;   HYSTEROSCOPY N/A 10/15/2013   Procedure: HYSTEROSCOPY with removal of IUD;  Surgeon: Lyman Speller, MD;  Location: [facility];  Service: Gynecology;  Laterality: N/A;   LAPAROSCOPIC BILATERAL SALPINGECTOMY Bilateral 01/04/2020   Procedure: LAPAROSCOPIC BILATERAL SALPINGECTOMY;  Surgeon: Salvadore Dom, MD;  Location: [facility];  Service: Gynecology;  Laterality: Bilateral;   WISDOM TOOTH EXTRACTION Bilateral 2008   Social History   Social History Narrative   Lives at home with her husband and children   Right handed   Caffeine: none   Immunization History  Administered Date(s) Administered   Influenza,inj,Quad PF,6+ Mos 11/18/2012   Influenza-Unspecified 11/18/2017, 11/05/2018   Pneumococcal Polysaccharide-23 10/06/2019   Tdap 04/13/2018     Objective: Vital Signs: BP 118/75 (BP Location: Left Arm, Patient Position: Sitting, Cuff Size: Normal)   Pulse 71   Ht 5' 3.5" (1.613 m)   Wt 173 lb 6.4 oz (78.7 kg)   BMI 30.23 kg/m    Physical Exam Vitals and nursing note reviewed.  Constitutional:      Appearance: She is well-developed.  HENT:     Head: Normocephalic and atraumatic.  Eyes:     Conjunctiva/sclera: Conjunctivae normal.  Cardiovascular:     Rate and Rhythm: Normal rate and regular rhythm.     Heart sounds: Normal heart sounds.  Pulmonary:     Effort: Pulmonary effort is normal.     Breath sounds: Normal breath sounds.  Abdominal:     General: Bowel sounds are normal.     Palpations: Abdomen is soft.  Musculoskeletal:     Cervical back: Normal range of motion.  Lymphadenopathy:     Cervical: No cervical adenopathy.  Skin:    General: Skin is warm and dry.     Capillary Refill: Capillary refill takes less than 2 seconds.     Comments: Mild erythematous scaly  rash with excoriation was noted on the abdominal region.  Neurological:     Mental Status: She is alert and oriented to person, place, and time.  Psychiatric:        Behavior: Behavior normal.     Musculoskeletal Exam: C-spine was in good range of motion.  Shoulder joints, elbow joints, wrist joints, MCPs PIPs and DIPs with good range of motion with no synovitis.  Hip joints, knee joints, ankles, MTPs and PIPs with good range of motion with no synovitis.  CDAI Exam: CDAI Score: -- Patient Global: --; Provider Global: -- Swollen: --; Tender: -- Joint Exam 09/13/2020   No joint exam has been documented for this visit   There is currently no information documented on the homunculus. Go to the Rheumatology activity and complete the homunculus joint exam.  Investigation: No additional findings.  Imaging: No results found.  Recent Labs: Lab Results  Component Value Date   WBC 8.6 04/13/2018   HGB 13.1 06/18/2018  PLT 260.0 04/13/2018   NA 140 04/13/2018   K 4.1 04/13/2018   CL 104 04/13/2018   CO2 27 04/13/2018   GLUCOSE 92 04/13/2018   BUN 19 04/13/2018   CREATININE 0.98 04/13/2018   BILITOT 0.7 04/13/2018   ALKPHOS 50 04/13/2018   AST 14 04/13/2018   ALT 10 04/13/2018   PROT 6.6 04/13/2018   ALBUMIN 4.3 04/13/2018   CALCIUM 9.2 04/13/2018   GFRAA >60 12/06/2016     Speciality Comments: Plaquenil started on Jun 25, 2018  Procedures:  No procedures performed Allergies: Penicillins   Assessment / Plan:     Visit Diagnoses: 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  She continues to have dry mouth and dry eyes and fatigue.  There is no history of malar rash, photosensitivity, Raynaud's phenomenon, lymphadenopathy.  She also complains of arthralgias but no synovitis was noted.  I will obtain [facility] labs.  I will check CBC with differential and CMP  with GFR, sed rate and UA in the office today.  I will reassess the need for hydroxychloroquine once the labs are available.  High risk medication use -she was tried on Plaquenil 200 mg p.o. twice daily in the past.  She discontinued Plaquenil and came off the medication in 2020 due to insurance issues.  SS-B antibody positive  Rash-patient states that she developed a rash on her abdominal region on the inner part of her arms and her inner thighs when she was at the beach 2 weeks ago.  She was given an intramuscular cortisone injection and the rash improved.  She had pruritus with the rash.  She had few lesions on her abdomen today which appeared to be consistent with contact dermatitis.  Other fatigue - Improved on Plaquenil per patient.  I will check CK and TSH today.  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  No synovitis was noted on the examination today.  She complains of discomfort in her hands and her feet.  I gave her a handout on hand exercises.  History of anxiety  Chronic migraine without aura without status migrainosus, not intractable  Attention deficit hyperactivity disorder (ADHD), predominantly inattentive type  Orders: Orders Placed This Encounter  Procedures   CBC with Differential/Platelet   COMPLETE METABOLIC PANEL WITH GFR   Urinalysis, Routine w reflex microscopic   Sedimentation rate   CK   TSH    No orders of the defined types were placed in this encounter.   Follow-Up Instructions: Return in about 4 weeks (around 10/11/2020) for Sicca complex.   Bo Merino, MD  Note - This record has been created using Editor, commissioning.  Chart creation errors have been sought, but may not always  have been located. Such creation errors do not reflect on  the standard of medical care.

## 2020-09-13 ENCOUNTER — Other Ambulatory Visit: Payer: Self-pay

## 2020-09-13 ENCOUNTER — Ambulatory Visit (INDEPENDENT_AMBULATORY_CARE_PROVIDER_SITE_OTHER): Payer: 59 | Admitting: Rheumatology

## 2020-09-13 ENCOUNTER — Encounter: Payer: Self-pay | Admitting: Rheumatology

## 2020-09-13 VITALS — BP 118/75 | HR 71 | Ht 63.5 in | Wt 173.4 lb

## 2020-09-13 DIAGNOSIS — M3500 Sicca syndrome, unspecified: Secondary | ICD-10-CM | POA: Diagnosis not present

## 2020-09-13 DIAGNOSIS — R768 Other specified abnormal immunological findings in serum: Secondary | ICD-10-CM | POA: Diagnosis not present

## 2020-09-13 DIAGNOSIS — G43709 Chronic migraine without aura, not intractable, without status migrainosus: Secondary | ICD-10-CM

## 2020-09-13 DIAGNOSIS — M2550 Pain in unspecified joint: Secondary | ICD-10-CM

## 2020-09-13 DIAGNOSIS — R7689 Other specified abnormal immunological findings in serum: Secondary | ICD-10-CM

## 2020-09-13 DIAGNOSIS — R5383 Other fatigue: Secondary | ICD-10-CM | POA: Diagnosis not present

## 2020-09-13 DIAGNOSIS — R21 Rash and other nonspecific skin eruption: Secondary | ICD-10-CM

## 2020-09-13 DIAGNOSIS — Z8659 Personal history of other mental and behavioral disorders: Secondary | ICD-10-CM

## 2020-09-13 DIAGNOSIS — Z79899 Other long term (current) drug therapy: Secondary | ICD-10-CM | POA: Diagnosis not present

## 2020-09-13 DIAGNOSIS — F900 Attention-deficit hyperactivity disorder, predominantly inattentive type: Secondary | ICD-10-CM

## 2020-09-13 NOTE — Patient Instructions (Signed)
Hand Exercises Hand exercises can be helpful for almost anyone. These exercises can strengthen the hands, improve flexibility and movement, and increase blood flow to the hands. These results can make work and daily tasks easier. Hand exercises can be especially helpful for people who have joint pain from arthritis or have nerve damage from overuse (carpal tunnel syndrome). These exercises can also help people who have injured a hand. Exercises Most of these hand exercises are gentle stretching and motion exercises. It is usually safe to do them often throughout the day. Warming up your hands before exercise may help to reduce stiffness. You can do this with gentle massage orby placing your hands in warm water for 10-15 minutes. It is normal to feel some stretching, pulling, tightness, or mild discomfort as you begin new exercises. This will gradually improve. Stop an exercise right away if you feel sudden, severe pain or your pain gets worse. Ask your healthcare provider which exercises are best for you. Knuckle bend or "claw" fist Stand or sit with your arm, hand, and all five fingers pointed straight up. Make sure to keep your wrist straight during the exercise. Gently bend your fingers down toward your palm until the tips of your fingers are touching the top of your palm. Keep your big knuckle straight and just bend the small knuckles in your fingers. Hold this position for __________ seconds. Straighten (extend) your fingers back to the starting position. Repeat this exercise 5-10 times with each hand. Full finger fist Stand or sit with your arm, hand, and all five fingers pointed straight up. Make sure to keep your wrist straight during the exercise. Gently bend your fingers into your palm until the tips of your fingers are touching the middle of your palm. Hold this position for __________ seconds. Extend your fingers back to the starting position, stretching every joint fully. Repeat this  exercise 5-10 times with each hand. Straight fist Stand or sit with your arm, hand, and all five fingers pointed straight up. Make sure to keep your wrist straight during the exercise. Gently bend your fingers at the big knuckle, where your fingers meet your hand, and the middle knuckle. Keep the knuckle at the tips of your fingers straight and try to touch the bottom of your palm. Hold this position for __________ seconds. Extend your fingers back to the starting position, stretching every joint fully. Repeat this exercise 5-10 times with each hand. Tabletop Stand or sit with your arm, hand, and all five fingers pointed straight up. Make sure to keep your wrist straight during the exercise. Gently bend your fingers at the big knuckle, where your fingers meet your hand, as far down as you can while keeping the small knuckles in your fingers straight. Think of forming a tabletop with your fingers. Hold this position for __________ seconds. Extend your fingers back to the starting position, stretching every joint fully. Repeat this exercise 5-10 times with each hand. Finger spread Place your hand flat on a table with your palm facing down. Make sure your wrist stays straight as you do this exercise. Spread your fingers and thumb apart from each other as far as you can until you feel a gentle stretch. Hold this position for __________ seconds. Bring your fingers and thumb tight together again. Hold this position for __________ seconds. Repeat this exercise 5-10 times with each hand. Making circles Stand or sit with your arm, hand, and all five fingers pointed straight up. Make sure to keep your   wrist straight during the exercise. Make a circle by touching the tip of your thumb to the tip of your index finger. Hold for __________ seconds. Then open your hand wide. Repeat this motion with your thumb and each finger on your hand. Repeat this exercise 5-10 times with each hand. Thumb motion Sit  with your forearm resting on a table and your wrist straight. Your thumb should be facing up toward the ceiling. Keep your fingers relaxed as you move your thumb. Lift your thumb up as high as you can toward the ceiling. Hold for __________ seconds. Bend your thumb across your palm as far as you can, reaching the tip of your thumb for the small finger (pinkie) side of your palm. Hold for __________ seconds. Repeat this exercise 5-10 times with each hand. Grip strengthening  Hold a stress ball or other soft ball in the middle of your hand. Slowly increase the pressure, squeezing the ball as much as you can without causing pain. Think of bringing the tips of your fingers into the middle of your palm. All of your finger joints should bend when doing this exercise. Hold your squeeze for __________ seconds, then relax. Repeat this exercise 5-10 times with each hand. Contact a health care provider if: Your hand pain or discomfort gets much worse when you do an exercise. Your hand pain or discomfort does not improve within 2 hours after you exercise. If you have any of these problems, stop doing these exercises right away. Do not do them again unless your health care provider says that you can. Get help right away if: You develop sudden, severe hand pain or swelling. If this happens, stop doing these exercises right away. Do not do them again unless your health care provider says that you can. This information is not intended to replace advice given to you by your health care provider. Make sure you discuss any questions you have with your healthcare provider. Document Revised: 05/14/2018 Document Reviewed: 01/22/2018 Elsevier Patient Education  2022 [employer]  

## 2020-09-14 LAB — CBC WITH DIFFERENTIAL/PLATELET
Absolute Monocytes: 601 cells/uL (ref 200–950)
Basophils Absolute: 36 cells/uL (ref 0–200)
Basophils Relative: 0.4 %
Eosinophils Absolute: 0 cells/uL — ABNORMAL LOW (ref 15–500)
Eosinophils Relative: 0 %
HCT: 43.2 % (ref 35.0–45.0)
Hemoglobin: 13.6 g/dL (ref 11.7–15.5)
Lymphs Abs: 2230 cells/uL (ref 850–3900)
MCH: 26.9 pg — ABNORMAL LOW (ref 27.0–33.0)
MCHC: 31.5 g/dL — ABNORMAL LOW (ref 32.0–36.0)
MCV: 85.4 fL (ref 80.0–100.0)
MPV: 11.6 fL (ref 7.5–12.5)
Monocytes Relative: 6.6 %
Neutro Abs: 6234 cells/uL (ref 1500–7800)
Neutrophils Relative %: 68.5 %
Platelets: 263 10*3/uL (ref 140–400)
RBC: 5.06 10*6/uL (ref 3.80–5.10)
RDW: 12.8 % (ref 11.0–15.0)
Total Lymphocyte: 24.5 %
WBC: 9.1 10*3/uL (ref 3.8–10.8)

## 2020-09-14 LAB — URINALYSIS, ROUTINE W REFLEX MICROSCOPIC
Bilirubin Urine: NEGATIVE
Glucose, UA: NEGATIVE
Hgb urine dipstick: NEGATIVE
Ketones, ur: NEGATIVE
Leukocytes,Ua: NEGATIVE
Nitrite: NEGATIVE
Protein, ur: NEGATIVE
Specific Gravity, Urine: 1.024 (ref 1.001–1.035)
pH: 5.5 (ref 5.0–8.0)

## 2020-09-14 LAB — CK: Total CK: 51 U/L (ref 29–143)

## 2020-09-14 LAB — COMPLETE METABOLIC PANEL WITH GFR
AG Ratio: 1.6 (calc) (ref 1.0–2.5)
ALT: 11 U/L (ref 6–29)
AST: 12 U/L (ref 10–30)
Albumin: 4.2 g/dL (ref 3.6–5.1)
Alkaline phosphatase (APISO): 50 U/L (ref 31–125)
BUN: 14 mg/dL (ref 7–25)
CO2: 29 mmol/L (ref 20–32)
Calcium: 9.4 mg/dL (ref 8.6–10.2)
Chloride: 103 mmol/L (ref 98–110)
Creat: 0.77 mg/dL (ref 0.50–0.97)
Globulin: 2.6 g/dL (calc) (ref 1.9–3.7)
Glucose, Bld: 87 mg/dL (ref 65–99)
Potassium: 4 mmol/L (ref 3.5–5.3)
Sodium: 139 mmol/L (ref 135–146)
Total Bilirubin: 0.4 mg/dL (ref 0.2–1.2)
Total Protein: 6.8 g/dL (ref 6.1–8.1)
eGFR: 103 mL/min/{1.73_m2} (ref >=60)

## 2020-09-14 LAB — TSH: TSH: 0.7 mIU/L

## 2020-09-14 LAB — SEDIMENTATION RATE: Sed Rate: 6 mm/h (ref 0–20)

## 2020-09-14 NOTE — Progress Notes (Signed)
CBC, CMP, UA, TSH, CK, sed rate are within normal limits.

## 2020-10-03 NOTE — Progress Notes (Deleted)
Office Visit Note  Patient: Teresa Holden             Date of Birth: March 30, 1985           MRN: [id number]             PCP: Leamon Arnt, MD Referring: Leamon Arnt, MD Visit Date: 10/17/2020 Occupation: '@GUAROCC'$ @  Subjective:  Discuss AVISE results   History of Present Illness: Teresa Holden is a 35 y.o. female with history of sicca complex.   Activities of Daily Living:  Patient reports morning stiffness for *** {minute/hour:19697}.   Patient {ACTIONS;DENIES/REPORTS:21021675::"Denies"} nocturnal pain.  Difficulty dressing/grooming: {ACTIONS;DENIES/REPORTS:21021675::"Denies"} Difficulty climbing stairs: {ACTIONS;DENIES/REPORTS:21021675::"Denies"} Difficulty getting out of chair: {ACTIONS;DENIES/REPORTS:21021675::"Denies"} Difficulty using hands for taps, buttons, cutlery, and/or writing: {ACTIONS;DENIES/REPORTS:21021675::"Denies"}  No Rheumatology ROS completed.   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   Sjogren's syndrome ([place]) 08/29/2016   Chronic migraine without aura without status migrainosus, not intractable 06/10/2015   Anxiety     Past Medical History:  Diagnosis Date   ADD (attention deficit disorder with hyperactivity)    Adverse effect of unspecified general anesthetics, subsequent encounter    difficulty waking up from anesthesia   Family history of anesthesia complication    mother---  hard to wake   GAD (generalized anxiety disorder)    GERD (gastroesophageal reflux disease)    Hypermobility arthralgia    followed by dr Estanislado Pandy---     Migraines    neurologist--- dr Jaynee Eagles   PONV (postoperative nausea and vomiting)    Sjogren's disease ([place])    rheumotologist-- dr s.  Estanislado Pandy---  chronic sicca symptoms    Family History  Problem Relation Age of Onset   Sarcoidosis Mother    Diabetes Mother    Diabetes Father    Diabetes Brother    Healthy Son    Heart disease Maternal Grandfather    Diabetes Paternal Grandfather    Heart disease Paternal Grandfather    Healthy Daughter    Healthy Son    Past Surgical History:  Procedure Laterality Date   BREAST ENHANCEMENT SURGERY  2013   Dr. Lenon Curt   CHOLECYSTECTOMY N/A 03/07/2016   Procedure: LAPAROSCOPIC CHOLECYSTECTOMY;  Surgeon: Coralie Keens, MD;  Location: [facility];  Service: General;  Laterality: N/A;   HYSTEROSCOPY N/A 10/15/2013   Procedure: HYSTEROSCOPY with removal of IUD;  Surgeon: Lyman Speller, MD;  Location: [facility];  Service: Gynecology;  Laterality: N/A;   LAPAROSCOPIC BILATERAL SALPINGECTOMY Bilateral 01/04/2020   Procedure: LAPAROSCOPIC BILATERAL SALPINGECTOMY;  Surgeon: Salvadore Dom, MD;  Location: [facility];  Service: Gynecology;  Laterality: Bilateral;   WISDOM TOOTH EXTRACTION Bilateral 2008   Social History   Social History Narrative   Lives at home with her husband and children   Right handed   Caffeine: none   Immunization History  Administered Date(s) Administered   Influenza,inj,Quad PF,6+ Mos 11/18/2012   Influenza-Unspecified 11/18/2017, 11/05/2018   Pneumococcal Polysaccharide-23 10/06/2019   Tdap 04/13/2018     Objective: Vital Signs: There were no vitals taken for this visit.   Physical Exam Vitals and nursing note reviewed.  Constitutional:      Appearance: She is well-developed.  HENT:     Head: Normocephalic and  atraumatic.  Eyes:     Conjunctiva/sclera: Conjunctivae normal.  Pulmonary:     Effort: Pulmonary effort is normal.  Abdominal:     Palpations: Abdomen is soft.  Musculoskeletal:     Cervical back: Normal range of motion.  Skin:    General: Skin is warm and dry.     Capillary Refill: Capillary  refill takes less than 2 seconds.  Neurological:     Mental Status: She is alert and oriented to person, place, and time.  Psychiatric:        Behavior: Behavior normal.     Musculoskeletal Exam: ***  CDAI Exam: CDAI Score: -- Patient Global: --; Provider Global: -- Swollen: --; Tender: -- Joint Exam 10/17/2020   No joint exam has been documented for this visit   There is currently no information documented on the homunculus. Go to the Rheumatology activity and complete the homunculus joint exam.  Investigation: No additional findings.  Imaging: No results found.  Recent Labs: Lab Results  Component Value Date   WBC 9.1 09/13/2020   HGB 13.6 09/13/2020   PLT 263 09/13/2020   NA 139 09/13/2020   K 4.0 09/13/2020   CL 103 09/13/2020   CO2 29 09/13/2020   GLUCOSE 87 09/13/2020   BUN 14 09/13/2020   CREATININE 0.77 09/13/2020   BILITOT 0.4 09/13/2020   ALKPHOS 50 04/13/2018   AST 12 09/13/2020   ALT 11 09/13/2020   PROT 6.8 09/13/2020   ALBUMIN 4.3 04/13/2018   CALCIUM 9.4 09/13/2020   GFRAA >60 12/06/2016    Speciality Comments: Plaquenil started on Jun 25, 2018  Procedures:  No procedures performed Allergies: Penicillins   Assessment / Plan:     Visit Diagnoses: No diagnosis found.  Orders: No orders of the defined types were placed in this encounter.  No orders of the defined types were placed in this encounter.   Face-to-face time spent with patient was *** minutes. Greater than 50% of time was spent in counseling and coordination of care.  Follow-Up Instructions: No follow-ups on file.   Earnestine Mealing, CMA  Note - This record has been created using Editor, commissioning.  Chart creation errors have been sought, but may not always  have been located. Such creation errors do not reflect on  the standard of medical care.

## 2020-10-17 ENCOUNTER — Ambulatory Visit: Payer: 59 | Admitting: Physician Assistant

## 2020-10-17 DIAGNOSIS — G43709 Chronic migraine without aura, not intractable, without status migrainosus: Secondary | ICD-10-CM

## 2020-10-17 DIAGNOSIS — M2550 Pain in unspecified joint: Secondary | ICD-10-CM

## 2020-10-17 DIAGNOSIS — R768 Other specified abnormal immunological findings in serum: Secondary | ICD-10-CM

## 2020-10-17 DIAGNOSIS — R21 Rash and other nonspecific skin eruption: Secondary | ICD-10-CM

## 2020-10-17 DIAGNOSIS — F900 Attention-deficit hyperactivity disorder, predominantly inattentive type: Secondary | ICD-10-CM

## 2020-10-17 DIAGNOSIS — Z8659 Personal history of other mental and behavioral disorders: Secondary | ICD-10-CM

## 2020-10-17 DIAGNOSIS — Z79899 Other long term (current) drug therapy: Secondary | ICD-10-CM

## 2020-10-17 DIAGNOSIS — M3500 Sicca syndrome, unspecified: Secondary | ICD-10-CM

## 2020-10-17 DIAGNOSIS — R5383 Other fatigue: Secondary | ICD-10-CM

## 2021-10-29 ENCOUNTER — Encounter: Payer: Self-pay | Admitting: *Deleted

## 2022-01-17 ENCOUNTER — Encounter: Payer: Self-pay | Admitting: *Deleted

## 2022-07-11 ENCOUNTER — Other Ambulatory Visit (HOSPITAL_COMMUNITY): Payer: Self-pay

## 2022-11-13 ENCOUNTER — Ambulatory Visit: Payer: Commercial Managed Care - HMO | Admitting: Podiatry

## 2022-11-13 ENCOUNTER — Ambulatory Visit (INDEPENDENT_AMBULATORY_CARE_PROVIDER_SITE_OTHER): Payer: Commercial Managed Care - HMO

## 2022-11-13 ENCOUNTER — Encounter: Payer: Self-pay | Admitting: Podiatry

## 2022-11-13 DIAGNOSIS — M21611 Bunion of right foot: Secondary | ICD-10-CM

## 2022-11-13 DIAGNOSIS — M25871 Other specified joint disorders, right ankle and foot: Secondary | ICD-10-CM

## 2022-11-13 MED ORDER — MELOXICAM 7.5 MG PO TABS: 7.5000 mg | ORAL_TABLET | Freq: Every day | ORAL | 0 refills | Status: DC

## 2022-11-13 NOTE — Progress Notes (Signed)
Chief Complaint  Patient presents with   Foot Pain    Pain to first MPJ region right foot, started two weeks ago. Tender aching sensation   HPI: 37 y.o. female presents today with pain underneath her bunion of the right foot.  States this started approximately 2 weeks ago.  Denies trauma.  Aggravated with weightbearing.  Denies any bruising  Past Medical History:  Diagnosis Date   ADD (attention deficit disorder with hyperactivity)    Adverse effect of unspecified general anesthetics, subsequent encounter    difficulty waking up from anesthesia   Family history of anesthesia complication    mother---  hard to wake   GAD (generalized anxiety disorder)    GERD (gastroesophageal reflux disease)    Hypermobility arthralgia    followed by dr Corliss Skains---     Migraines    neurologist--- dr Lucia Gaskins   PONV (postoperative nausea and vomiting)    Sjogren's disease ([facility])    rheumotologist-- dr s. Corliss Skains---  chronic sicca symptoms    Past Surgical History:  Procedure Laterality Date   BREAST ENHANCEMENT SURGERY  2013   Dr. Izora Ribas   CHOLECYSTECTOMY N/A 03/07/2016   Procedure: LAPAROSCOPIC CHOLECYSTECTOMY;  Surgeon: Abigail Miyamoto, MD;  Location: [facility];  Service: General;  Laterality: N/A;   HYSTEROSCOPY N/A 10/15/2013   Procedure: HYSTEROSCOPY with removal of IUD;  Surgeon: Annamaria Boots, MD;  Location: [facility];  Service: Gynecology;  Laterality: N/A;   LAPAROSCOPIC BILATERAL SALPINGECTOMY Bilateral 01/04/2020   Procedure: LAPAROSCOPIC BILATERAL SALPINGECTOMY;  Surgeon: Romualdo Bolk, MD;  Location[facility];  Service: Gynecology;  Laterality: Bilateral;   WISDOM TOOTH EXTRACTION Bilateral 2008    Allergies  Allergen Reactions   Penicillins Rash    Has patient had a PCN reaction causing immediate rash, facial/tongue/throat swelling, SOB or lightheadedness with hypotension: Yes Has patient had a PCN reaction causing severe rash  involving mucus membranes or skin necrosis: Yes Has patient had a PCN reaction that required hospitalization: No Has patient had a PCN reaction occurring within the last 10 years: No If all of the above answers are "NO", then may proceed with Cephalosporin use.    Physical Exam: General: The patient is alert and oriented x3 in no acute distress.  Dermatology: Skin is warm, dry and supple bilateral lower extremities. Interspaces are clear of maceration and debris.    Vascular: Palpable pedal pulses bilaterally. Capillary refill within normal limits.  No appreciable edema.  No erythema or calor.  Neurological: Light touch sensation grossly intact bilateral feet.   Musculoskeletal Exam: Pain on palpation of the tibial sesamoid more so than the fibular sesamoid, but both are somewhat painful today.  No pain on the dorsal aspect or dorsal medial aspect of the first MPJ.  There is minimal bony prominence at the medial first met head.  With pressure on the sesamoid bones, and moving the hallux through range of motion, this aggravates her pain at the sesamoids.  Radiographic Exam (right foot, 3 weightbearing views, 11/13/2022):  Normal osseous mineralization. Joint spaces preserved.  There appears to be a slight lucent irregularity of the tibial sesamoid on x-ray.  No clear fracture line is seen.  Fibular sesamoid appears normal.  Mild joint space narrowing at the first MPJ.  Assessment/Plan of Care: 1. Sesamoiditis of right foot     Meds ordered this encounter  Medications   meloxicam (MOBIC) 7.5 MG tablet    Sig: Take 1 tablet (  7.5 mg total) by mouth daily.    Dispense:  30 tablet    Refill:  0   SURGICAL BOOT  Discussed clinical and radiographic findings with patient today.  She was fitted for a surgical shoe, as she needs a stiff soled shoe to prevent movement and motion of the sesamoid bones to protect the area and allow healing.  Also discussed the patient purchasing a sesamoid relief  sleeve off of [employer] and/or purchasing a thin steel insole to place in her sneakers to make them become a stiff soled shoe.  She only needs to do this for the affected foot.  Decrease weightbearing activities until pain resolves.  Prescription for meloxicam 7.5 mg sent to her pharmacy for pain and inflammation.  Follow-up in 2 to 3 weeks to rex-ray and check the sesamoid bones.   Clerance Lav, DPM, FACFAS [facility]     [address] [place], [place] [address]                Office [phone]  Fax [phone]

## 2022-11-15 DIAGNOSIS — M25871 Other specified joint disorders, right ankle and foot: Secondary | ICD-10-CM | POA: Insufficient documentation

## 2022-11-19 ENCOUNTER — Telehealth: Payer: Self-pay

## 2022-11-19 NOTE — Telephone Encounter (Signed)
Patient called and left and left message regarding Sesamoid sleeve. She states that the only thing that she found on [employer] was a metatarsal pad. She would like to know if the sleeve is something that you could order for her.

## 2022-11-20 NOTE — Telephone Encounter (Signed)
I spoke wit patient and she stated that she had looked that up, but it was just the delivery date that she was concerned about. But she will try to order that.

## 2022-12-04 ENCOUNTER — Ambulatory Visit (INDEPENDENT_AMBULATORY_CARE_PROVIDER_SITE_OTHER): Payer: Managed Care, Other (non HMO)

## 2022-12-04 ENCOUNTER — Ambulatory Visit (INDEPENDENT_AMBULATORY_CARE_PROVIDER_SITE_OTHER): Payer: Managed Care, Other (non HMO) | Admitting: Podiatry

## 2022-12-04 ENCOUNTER — Telehealth: Payer: Self-pay | Admitting: Podiatry

## 2022-12-04 ENCOUNTER — Encounter: Payer: Self-pay | Admitting: Podiatry

## 2022-12-04 DIAGNOSIS — M792 Neuralgia and neuritis, unspecified: Secondary | ICD-10-CM | POA: Diagnosis not present

## 2022-12-04 DIAGNOSIS — M25871 Other specified joint disorders, right ankle and foot: Secondary | ICD-10-CM

## 2022-12-04 MED ORDER — DICLOFENAC SODIUM 75 MG PO TBEC: 75.0000 mg | DELAYED_RELEASE_TABLET | Freq: Two times a day (BID) | ORAL | 0 refills | Status: DC

## 2022-12-04 NOTE — Telephone Encounter (Signed)
Patient would like to know if she can go to an imaging facility in [place] for her MRI

## 2022-12-04 NOTE — Progress Notes (Signed)
Chief Complaint  Patient presents with   Foot Pain    Sesamoiditis f/up Right foot. Still painful, didn't tolerate surgical shoe well. Also c/o 2nd MPJ pain. "Like she is walking on a rock"    HPI: 37 y.o. female presents today for follow-up of right tibial and fibular sesamoiditis.  She notes that the postop shoe felt that it was putting more pressure on the area so she stopped wearing it.  As she is more comfortable in her Birkenstocks and house slippers.  She notes that she cannot find a size small or medium sesamoid relief sleeve on [employer] for the right foot.  She states that she only found them for the left foot.  The felt offloading pad was comfortable and she does continue to wear this sometimes.  The meloxicam does provide temporary relief.  Also notes some burning pain that has returned to the right second interspace area.  She has a callus submet 2 right foot  Past Medical History:  Diagnosis Date   ADD (attention deficit disorder with hyperactivity)    Adverse effect of unspecified general anesthetics, subsequent encounter    difficulty waking up from anesthesia   Family history of anesthesia complication    mother---  hard to wake   GAD (generalized anxiety disorder)    GERD (gastroesophageal reflux disease)    Hypermobility arthralgia    followed by dr Corliss Skains---     Migraines    neurologist--- dr Lucia Gaskins   PONV (postoperative nausea and vomiting)    Sjogren's disease ([facility])    rheumotologist-- dr s. Corliss Skains---  chronic sicca symptoms    Past Surgical History:  Procedure Laterality Date   BREAST ENHANCEMENT SURGERY  2013   Dr. Izora Ribas   CHOLECYSTECTOMY N/A 03/07/2016   Procedure: LAPAROSCOPIC CHOLECYSTECTOMY;  Surgeon: Abigail Miyamoto, MD;  Location: [facility];  Service: General;  Laterality: N/A;   HYSTEROSCOPY N/A 10/15/2013   Procedure: HYSTEROSCOPY with removal of IUD;  Surgeon: Annamaria Boots, MD;  Location: [facility];  Service: Gynecology;   Laterality: N/A;   LAPAROSCOPIC BILATERAL SALPINGECTOMY Bilateral 01/04/2020   Procedure: LAPAROSCOPIC BILATERAL SALPINGECTOMY;  Surgeon: Romualdo Bolk, MD;  Location: [facility];  Service: Gynecology;  Laterality: Bilateral;   WISDOM TOOTH EXTRACTION Bilateral 2008    Allergies  Allergen Reactions   Penicillins Rash    Has patient had a PCN reaction causing immediate rash, facial/tongue/throat swelling, SOB or lightheadedness with hypotension: Yes Has patient had a PCN reaction causing severe rash involving mucus membranes or skin necrosis: Yes Has patient had a PCN reaction that required hospitalization: No Has patient had a PCN reaction occurring within the last 10 years: No If all of the above answers are "NO", then may proceed with Cephalosporin use.     Physical Exam: Palpable DP and PT pulse right foot.  Pain on palpation of the tibial and fibular sesamoids right foot submet 1.  Small hyperkeratotic lesion right submet 2.  Negative Mulder sign in the second interspace but there is some pain with dorsal to plantar compression of the second interspace.  No significant edema is noted no ecchymosis is noted.  Movement of the great toe while pushing on the sesamoid bones does also reproduce pain  Radiographic Exam (right foot, 3 weightbearing views, 12/04/2022:  Normal osseous mineralization. Joint spaces preserved.  On the DP view there does appear to be small erosive changes on the lateral aspect of the tibial sesamoid and  medial aspect of the fibular sesamoid.  These cannot be correlated with the medial oblique view or the lateral view.  No other significant findings are noted.  Assessment/Plan of Care: 1. Sesamoiditis of right foot   2. Neuralgia      Meds ordered this encounter  Medications   diclofenac (VOLTAREN) 75 MG EC tablet    Sig: Take 1 tablet (75 mg total) by mouth 2 (two) times daily.    Dispense:  30 tablet    Refill:  0   MR FOOT RIGHT WO  CONTRAST  Discussed clinical and radiographic findings with patient today.  I feel that the patient would benefit from an MRI because this will also help rule out a right second interspace neuroma and give significantly more detail on the fibular and tibial sesamoids.  She was shown the small area of irregularity on x-ray and was in agreement to proceed with MRI.  I will see if there is a stress fracture or small chip fracture noted.  We may need to immobilize her in a cam walker if this is positive for those findings.  The small callus was shaved with a sterile #313 blade today.  She was given other felt dancer's pads to offload the sesamoids for now.  Will send in diclofenac instead of the meloxicam to see if this gives her more benefit and pain relief.  Follow-up after MRI  Clerance Lav, DPM, FACFAS [facility]     [address] [place], [place] [address]                Office [phone]  Fax [phone]

## 2023-01-06 ENCOUNTER — Encounter (HOSPITAL_BASED_OUTPATIENT_CLINIC_OR_DEPARTMENT_OTHER): Payer: Self-pay | Admitting: Emergency Medicine

## 2023-01-06 ENCOUNTER — Ambulatory Visit (HOSPITAL_BASED_OUTPATIENT_CLINIC_OR_DEPARTMENT_OTHER)
Admission: EM | Admit: 2023-01-06 | Discharge: 2023-01-06 | Disposition: A | Discharge status: Home / Self Care | Payer: Commercial Managed Care - HMO | Attending: Internal Medicine | Admitting: Internal Medicine

## 2023-01-06 DIAGNOSIS — J069 Acute upper respiratory infection, unspecified: Secondary | ICD-10-CM | POA: Diagnosis not present

## 2023-01-06 DIAGNOSIS — M3500 Sicca syndrome, unspecified: Secondary | ICD-10-CM | POA: Insufficient documentation

## 2023-01-06 DIAGNOSIS — R509 Fever, unspecified: Secondary | ICD-10-CM

## 2023-01-06 DIAGNOSIS — R059 Cough, unspecified: Secondary | ICD-10-CM | POA: Diagnosis present

## 2023-01-06 DIAGNOSIS — B9789 Other viral agents as the cause of diseases classified elsewhere: Secondary | ICD-10-CM | POA: Diagnosis not present

## 2023-01-06 LAB — POCT INFLUENZA A/B
Influenza A, POC: NEGATIVE
Influenza B, POC: NEGATIVE

## 2023-01-06 MED ORDER — ACETAMINOPHEN 325 MG PO TABS
975.0000 mg | ORAL_TABLET | Freq: Once | ORAL | Status: AC
  Administered 2023-01-06: 975 mg via ORAL

## 2023-01-06 MED ORDER — PROMETHAZINE-DM 6.25-15 MG/5ML PO SYRP
5.0000 mL | ORAL_SOLUTION | Freq: Every evening | ORAL | 0 refills | Status: DC | PRN
Start: 2023-01-06 — End: 2023-04-08

## 2023-01-06 MED ORDER — BENZONATATE 100 MG PO CAPS: 100.0000 mg | ORAL_CAPSULE | Freq: Three times a day (TID) | ORAL | 0 refills | Status: DC

## 2023-01-06 NOTE — Discharge Instructions (Signed)
You have a viral illness which will improve on its own with rest, fluids, and medications to help with your symptoms. We discussed prescriptions that may help with your symptoms: promethazine DM, tessalon perles You may use over the counter medicines as needed: tylenol/motrin, mucinex, zyrtec, Flonase Two teaspoons of honey in 1 cup of warm water every 4-6 hours may help with throat pains. Humidifier in room at nighttime may help soothe cough (clean well daily).   For chest pain, shortness of breath, inability to keep food or fluids down without vomiting, fever that does not respond to tylenol or motrin, or any other severe symptoms, please go to the ER for further evaluation. Return to urgent care as needed, otherwise follow-up with PCP.

## 2023-01-06 NOTE — ED Triage Notes (Signed)
Pt c/o chills and body aches since last night. Today feels worse.

## 2023-01-06 NOTE — ED Provider Notes (Signed)
[name]    CSN: [id number] Arrival date & time: 01/06/23  1148      History   Chief Complaint Chief Complaint  Patient presents with   Generalized Body Aches   Chills    HPI Teresa Holden is a 37 y.o. female.   Teresa Holden is a 37 y.o. female presenting for chief complaint of bodyaches, cough, congestion, sore throat, and chills that started yesterday on January 05, 2023.  Cough is dry and nonproductive.  Sore throat is worsened by swallowing.  Reports chills without known fever at home.  Currently febrile at 100.7 here.  She took ibuprofen 800 mg 45 minutes ago and states this is helped a little bit.  Denies N/V/D, abdominal pain, rash, chest pain, shortness of breath, and palpitations.  History of Sjogren's syndrome, otherwise no history of chronic respiratory problems.  Never smoker, denies drug use.  Her daughter has been sick with similar symptoms and so has her husband.  She has not had any recent Tylenol or other over-the-counter medications to help with symptoms PTA.     Past Medical History:  Diagnosis Date   ADD (attention deficit disorder with hyperactivity)    Adverse effect of unspecified general anesthetics, subsequent encounter    difficulty waking up from anesthesia   Family history of anesthesia complication    mother---  hard to wake   GAD (generalized anxiety disorder)    GERD (gastroesophageal reflux disease)    Hypermobility arthralgia    followed by dr Corliss Skains---     Migraines    neurologist--- dr Lucia Gaskins   PONV (postoperative nausea and vomiting)    Sjogren's disease (HCC)    rheumotologist-- dr s. 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 Izora Ribas   CHOLECYSTECTOMY N/A 03/07/2016   Procedure: LAPAROSCOPIC CHOLECYSTECTOMY;  Surgeon: Abigail Miyamoto, MD;  [facility];  Service: General;  Laterality: N/A;   HYSTEROSCOPY N/A 10/15/2013   Procedure: HYSTEROSCOPY with removal of IUD;  Surgeon: Annamaria Boots, MD;  Location: [facility];  Service: Gynecology;  Laterality: N/A;   LAPAROSCOPIC BILATERAL SALPINGECTOMY Bilateral 01/04/2020   Procedure: LAPAROSCOPIC BILATERAL SALPINGECTOMY;  Surgeon: Romualdo Bolk, MD;  Location: [facility];  Service: Gynecology;  Laterality: Bilateral;   WISDOM TOOTH EXTRACTION Bilateral 2008    OB History     Gravida  2   Para  2   Term  1   Preterm  0   AB  0   Living  2      SAB  0   IAB  0   Ectopic  0   Multiple  0   Live Births  2            Home Medications    Prior to Admission medications   Medication Sig Start Date End Date Taking? Authorizing Provider  benzonatate (TESSALON) 100 MG capsule Take 1 capsule (100 mg total) by  mouth every 8 (eight) hours. 01/06/23  Yes Carlisle Beers, FNP  colestipol (COLESTID) 1 g tablet Take 1 g by mouth 2 (two) times daily. 12/17/22  Yes [provider]  promethazine-dextromethorphan (PROMETHAZINE-DM) 6.25-15 MG/5ML syrup Take 5 mLs by mouth at bedtime as needed for cough. 01/06/23  Yes Carlisle Beers, FNP  diclofenac (VOLTAREN) 75 MG EC tablet Take 1 tablet (75 mg total) by mouth 2 (two) times daily. 12/04/22   McCaughan, Dia D, DPM  famotidine (PEPCID) 20 MG tablet Take 20 mg by mouth as needed for heartburn or indigestion.     [provider]  hydrocortisone 2.5 % cream Apply  topically as needed. 09/06/20   [provider]    Family History Family History  Problem Relation Age of Onset   Sarcoidosis Mother    Diabetes Mother    Diabetes Father    Diabetes Brother    Healthy Son    Heart disease Maternal Grandfather    Diabetes Paternal Grandfather    Heart disease Paternal Grandfather    Healthy Daughter    Healthy Son     Social History Social History   Tobacco Use   Smoking status: Never   Smokeless tobacco: Never  Vaping Use   Vaping status: Never Used  Substance Use Topics   Alcohol use: No    Alcohol/week: 0.0 standard drinks of alcohol   Drug use: Never     Allergies   Penicillins   Review of Systems Review of Systems Per HPI  Physical Exam Triage Vital Signs ED Triage Vitals [01/06/23 1215]  Encounter Vitals Group     BP 131/76     Systolic BP Percentile      Diastolic BP Percentile      Pulse Rate (!) 106     Resp 17     Temp (!) 100.7 F (38.2 C)     Temp Source Oral     SpO2 95 %     Weight      Height      Head Circumference      Peak Flow      Pain Score 7     Pain Loc      Pain Education      Exclude from Growth Chart    No data found.  Updated Vital Signs BP 131/76 (BP Location: Right Arm)   Pulse (!) 106   Temp (!) 100.7 F (38.2 C) (Oral)   Resp 17   LMP 01/05/2023   SpO2 95%   Visual Acuity Right Eye Distance:   Left Eye Distance:   Bilateral Distance:    Right Eye Near:   Left Eye Near:    Bilateral Near:     Physical Exam Vitals and nursing note reviewed.  Constitutional:      Appearance: She is ill-appearing. She is not toxic-appearing.  HENT:     Head: Normocephalic and atraumatic.     Right Ear: Hearing, tympanic membrane, ear canal and external ear normal.     Left Ear: Hearing, tympanic membrane, ear canal and external ear normal.     Nose: Nose normal.     Mouth/Throat:     Lips: Pink.     Mouth: Mucous membranes are moist. No injury.     Tongue: No lesions.  Tongue does not deviate from midline.     Palate: No mass and lesions.     Pharynx: Oropharynx is clear. Uvula midline. No pharyngeal swelling, oropharyngeal exudate, posterior  oropharyngeal erythema or uvula swelling.     Tonsils: No tonsillar exudate or tonsillar abscesses.  Eyes:     General: Lids are normal. Vision grossly intact. Gaze aligned appropriately.     Extraocular Movements: Extraocular movements intact.     Conjunctiva/sclera: Conjunctivae normal.  Cardiovascular:     Rate and Rhythm: Normal rate and regular rhythm.     Heart sounds: Normal heart sounds, S1 normal and S2 normal.  Pulmonary:     Effort: Pulmonary effort is normal. No respiratory distress.     Breath sounds: Normal breath sounds and air entry. No wheezing or rales.  Musculoskeletal:     Cervical back: Neck supple.  Lymphadenopathy:     Cervical: No cervical adenopathy.  Skin:    General: Skin is warm and dry.     Capillary Refill: Capillary refill takes less than 2 seconds.     Findings: No rash.  Neurological:     General: No focal deficit present.     Mental Status: She is alert and oriented to person, place, and time. Mental status is at baseline.     Cranial Nerves: No dysarthria or facial asymmetry.  Psychiatric:        Mood and Affect: Mood normal.        Speech: Speech normal.        Behavior: Behavior normal.        Thought Content: Thought content normal.        Judgment: Judgment normal.      UC Treatments / Results  Labs (all labs ordered are listed, but only abnormal results are displayed) Labs Reviewed  SARS CORONAVIRUS 2 (TAT 6-24 HRS)  POCT INFLUENZA A/B    EKG   Radiology No results found.  Procedures Procedures (including critical care time)  Medications Ordered in UC Medications  acetaminophen (TYLENOL) tablet 975 mg (975 mg Oral Given 01/06/23 1232)    Initial Impression / Assessment and Plan / UC Course  I have reviewed the triage vital signs and the nursing  notes.  Pertinent labs & imaging results that were available during my care of the patient were reviewed by me and considered in my medical decision making (see chart for details).   1. Viral URI with cough, fever Suspect viral URI, viral syndrome. Physical exam findings reassuring, vital signs hemodynamically stable. Low suspicion for pneumonia/acute cardiopulmonary abnormality, therefore deferred imaging of the chest. Advised supportive care, offered prescriptions for symptomatic relief.  Recommend continued use of OTC medications as needed, recommendations discussed with patient/caregiver and outlined in AVS.  Strep/viral testing: COVID-19 testing pending, POC flu negative.  Counseled patient on potential for adverse effects with medications prescribed/recommended today, strict ER and return-to-clinic precautions discussed, patient verbalized understanding.    Final Clinical Impressions(s) / UC Diagnoses   Final diagnoses:  Viral URI with cough  Fever, unspecified     Discharge Instructions      You have a viral illness which will improve on its own with rest, fluids, and medications to help with your symptoms. We discussed prescriptions that may help with your symptoms: promethazine DM, tessalon perles You may use over the counter medicines as needed: tylenol/motrin, mucinex, zyrtec, Flonase Two teaspoons of honey in 1 cup of warm water every 4-6 hours may help with throat pains. Humidifier in room at nighttime may help soothe cough (clean well daily).   For chest pain, shortness of breath, inability to keep food or fluids down without vomiting, fever that does not respond  to tylenol or motrin, or any other severe symptoms, please go to the ER for further evaluation. Return to urgent care as needed, otherwise follow-up with PCP.       ED Prescriptions     Medication Sig Dispense Auth. Provider   promethazine-dextromethorphan (PROMETHAZINE-DM) 6.25-15 MG/5ML syrup Take 5 mLs  by mouth at bedtime as needed for cough. 118 mL Reita May M, FNP   benzonatate (TESSALON) 100 MG capsule Take 1 capsule (100 mg total) by mouth every 8 (eight) hours. 21 capsule Carlisle Beers, FNP      PDMP not reviewed this encounter.   Carlisle Beers, [place] 01/06/23 1302

## 2023-01-08 ENCOUNTER — Ambulatory Visit (HOSPITAL_BASED_OUTPATIENT_CLINIC_OR_DEPARTMENT_OTHER)
Admission: EM | Admit: 2023-01-08 | Discharge: 2023-01-08 | Disposition: A | Discharge status: Home / Self Care | Payer: Commercial Managed Care - HMO | Attending: Internal Medicine | Admitting: Internal Medicine

## 2023-01-08 ENCOUNTER — Encounter (HOSPITAL_BASED_OUTPATIENT_CLINIC_OR_DEPARTMENT_OTHER): Payer: Self-pay

## 2023-01-08 DIAGNOSIS — J019 Acute sinusitis, unspecified: Secondary | ICD-10-CM | POA: Diagnosis not present

## 2023-01-08 LAB — SARS CORONAVIRUS 2 (TAT 6-24 HRS): SARS Coronavirus 2: NEGATIVE

## 2023-01-08 MED ORDER — DOXYCYCLINE HYCLATE 100 MG PO CAPS: 100.0000 mg | ORAL_CAPSULE | Freq: Two times a day (BID) | ORAL | 0 refills | Status: AC

## 2023-01-08 NOTE — ED Triage Notes (Signed)
Seen Monday for same. Influenza negative at that time. Taking Tylenol and Ibuprofen regularly. No antibiotics given at first visit. Cough, head congestion, sore throat.

## 2023-01-08 NOTE — ED Provider Notes (Signed)
[name]    CSN: [id number] Arrival date & time: 01/08/23  0843      History   Chief Complaint Chief Complaint  Patient presents with   Sore Throat   Cough    HPI Teresa Holden is a 37 y.o. female.    Sore Throat Associated symptoms include headaches. Pertinent negatives include no chest pain, no abdominal pain and no shortness of breath.  Cough Associated symptoms: chills, fever, headaches, rhinorrhea and sore throat   Associated symptoms: no chest pain, no ear pain and no shortness of breath   Sick for 3 days, started with bodyaches, fatigue, fever, chills.  Admits rhinorrhea, nasal congestion, sinus pain or pressure, sore throat, cough with occasional green sputum, headache, vomiting.  Seen 2 days ago for same, point-of-care flu is negative, COVID PCR was negative. Husband has had similar but milder symptoms.  Denies recent travel, denies confirmed COVID or flu exposures.  Past Medical History:  Diagnosis Date   ADD (attention deficit disorder with hyperactivity)    Adverse effect of unspecified general anesthetics, subsequent encounter    difficulty waking up from anesthesia   Family history of anesthesia complication    mother---  hard to wake   GAD (generalized anxiety disorder)    GERD (gastroesophageal reflux disease)    Hypermobility arthralgia    followed by dr Corliss Skains---     Migraines    neurologist--- dr Lucia Gaskins   PONV (postoperative nausea and vomiting)    Sjogren's disease (HCC)    rheumotologist-- dr s. 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 Izora Ribas   CHOLECYSTECTOMY N/A 03/07/2016   Procedure: LAPAROSCOPIC CHOLECYSTECTOMY;  Surgeon: Abigail Miyamoto, MD;  Location: [facility];  Service: General;  Laterality: N/A;   HYSTEROSCOPY N/A 10/15/2013   Procedure: HYSTEROSCOPY with removal of IUD;  Surgeon: Annamaria Boots, MD;  Location: [facility];  Service: Gynecology;  Laterality: N/A;   LAPAROSCOPIC BILATERAL SALPINGECTOMY Bilateral 01/04/2020   Procedure: LAPAROSCOPIC BILATERAL SALPINGECTOMY;  Surgeon: Romualdo Bolk, MD;  Location: [facility];  Service: Gynecology;  Laterality: Bilateral;   WISDOM TOOTH EXTRACTION Bilateral 2008    OB History     Gravida  2   Para  2   Term  1   Preterm  0   AB  0   Living  2      SAB  0   IAB  0   Ectopic  0   Multiple  0   Live Births  2            Home Medications    Prior to Admission medications   Medication Sig Start Date End Date Taking? Authorizing Provider  doxycycline (VIBRAMYCIN) 100 MG capsule Take 1 capsule (100 mg total) by mouth 2 (two) times daily for 7 days. 01/08/23 01/15/23 Yes Meliton Rattan, PA  benzonatate (TESSALON) 100 MG capsule Take 1 capsule (100 mg total) by mouth  every 8 (eight) hours. 01/06/23   Carlisle Beers, FNP  colestipol (COLESTID) 1 g tablet Take 1 g by mouth 2 (two) times daily. 12/17/22   [provider]  diclofenac (VOLTAREN) 75 MG EC tablet Take 1 tablet (75 mg total) by mouth 2 (two) times daily. 12/04/22   McCaughan, Dia D, DPM  famotidine (PEPCID) 20 MG tablet Take 20 mg by mouth as needed for heartburn or indigestion.     [provider]  hydrocortisone 2.5 % cream Apply topically as needed. 09/06/20   [provider]  promethazine-dextromethorphan (PROMETHAZINE-DM) 6.25-15 MG/5ML syrup  Take 5 mLs by mouth at bedtime as needed for cough. 01/06/23   Carlisle Beers, FNP    Family History Family History  Problem Relation Age of Onset   Sarcoidosis Mother    Diabetes Mother    Diabetes Father    Diabetes Brother    Healthy Son    Heart disease Maternal Grandfather    Diabetes Paternal Grandfather    Heart disease Paternal Grandfather    Healthy Daughter    Healthy Son     Social History Social History   Tobacco Use   Smoking status: Never   Smokeless tobacco: Never  Vaping Use   Vaping status: Never Used  Substance Use Topics   Alcohol use: No    Alcohol/week: 0.0 standard drinks of alcohol   Drug use: Never     Allergies   Penicillins   Review of Systems Review of Systems  Constitutional:  Positive for appetite change, chills, fatigue and fever.  HENT:  Positive for congestion, postnasal drip, rhinorrhea, sinus pressure, sinus pain and sore throat. Negative for dental problem and ear pain.   Respiratory:  Positive for cough. Negative for shortness of breath and stridor.   Cardiovascular:  Negative for chest pain.  Gastrointestinal:  Positive for nausea and vomiting. Negative for abdominal pain and diarrhea.  Neurological:  Positive for headaches.     Physical Exam Triage Vital Signs ED Triage Vitals [01/08/23 0908]  Encounter Vitals Group     BP 135/81     Systolic BP Percentile      Diastolic BP Percentile      Pulse Rate 80     Resp 20     Temp 97.8 F (36.6 C)     Temp Source Oral     SpO2 98 %     Weight      Height      Head Circumference      Peak Flow      Pain Score 8     Pain Loc      Pain Education      Exclude from Growth Chart    No data found.  Updated Vital Signs BP 135/81 (BP Location: Right Arm)   Pulse 80   Temp 97.8 F (36.6 C) (Oral)   Resp 20   LMP 01/05/2023   SpO2 98%   Visual Acuity Right Eye Distance:   Left Eye Distance:   Bilateral Distance:    Right Eye Near:   Left Eye Near:     Bilateral Near:     Physical Exam Vitals and nursing note reviewed.  Constitutional:      Appearance: She is not ill-appearing or toxic-appearing.  HENT:     Head: Normocephalic and atraumatic.     Right Ear: Tympanic membrane and ear canal normal.     Left Ear: Tympanic membrane and ear canal normal.  Nose: Congestion present.     Right Sinus: Maxillary sinus tenderness present. No frontal sinus tenderness.     Left Sinus: Maxillary sinus tenderness present. No frontal sinus tenderness.     Mouth/Throat:     Mouth: Mucous membranes are moist.     Pharynx: Oropharynx is clear. Uvula midline. No oropharyngeal exudate, posterior oropharyngeal erythema or uvula swelling.     Tonsils: No tonsillar exudate or tonsillar abscesses.  Eyes:     Conjunctiva/sclera: Conjunctivae normal.  Cardiovascular:     Rate and Rhythm: Normal rate and regular rhythm.  Pulmonary:     Effort: Pulmonary effort is normal. No respiratory distress.     Breath sounds: No wheezing, rhonchi or rales.  Musculoskeletal:     Cervical back: Neck supple.  Lymphadenopathy:     Cervical: No cervical adenopathy.  Neurological:     Mental Status: She is alert and oriented to person, place, and time.      UC Treatments / Results  Labs (all labs ordered are listed, but only abnormal results are displayed) Labs Reviewed - No data to display  EKG   Radiology No results found.  Procedures Procedures (including critical care time)  Medications Ordered in UC Medications - No data to display  Initial Impression / Assessment and Plan / UC Course  I have reviewed the triage vital signs and the nursing notes.  Pertinent labs & imaging results that were available during my care of the patient were reviewed by me and considered in my medical decision making (see chart for details).     37 year old female 3 days of rhinorrhea nasal congestion sore throat cough fever chills body aches fatigue headache.   Symptoms getting worse now having increased sinus pain and pressure and green sputum production.  She appears to not feel well but not toxic appearing she has stable vital signs, a lot of nasal congestion with green nasal drainage, sinus tenderness will treat for sinusitis, continue other medications for symptomatic relief.  She voices concern for pneumonia, her lungs are clear to auscultation, saturation 98%, no reason to suspect pneumonia at this time warning signs and follow-up reviewed Final Clinical Impressions(s) / UC Diagnoses   Final diagnoses:  Acute sinusitis, recurrence not specified, unspecified location   Discharge Instructions   None    ED Prescriptions     Medication Sig Dispense Auth. Provider   doxycycline (VIBRAMYCIN) 100 MG capsule Take 1 capsule (100 mg total) by mouth 2 (two) times daily for 7 days. 14 capsule Meliton Rattan, [place]      PDMP not reviewed this encounter.   Meliton Rattan, [place] 01/08/23 [phone]

## 2023-01-10 ENCOUNTER — Ambulatory Visit (HOSPITAL_BASED_OUTPATIENT_CLINIC_OR_DEPARTMENT_OTHER)
Admission: EM | Admit: 2023-01-10 | Discharge: 2023-01-10 | Disposition: A | Discharge status: Home / Self Care | Payer: Commercial Managed Care - HMO | Attending: Internal Medicine | Admitting: Internal Medicine

## 2023-01-10 ENCOUNTER — Encounter (HOSPITAL_BASED_OUTPATIENT_CLINIC_OR_DEPARTMENT_OTHER): Payer: Self-pay

## 2023-01-10 DIAGNOSIS — J209 Acute bronchitis, unspecified: Secondary | ICD-10-CM | POA: Diagnosis not present

## 2023-01-10 DIAGNOSIS — R0602 Shortness of breath: Secondary | ICD-10-CM

## 2023-01-10 MED ORDER — PREDNISONE 20 MG PO TABS
40.0000 mg | ORAL_TABLET | Freq: Every day | ORAL | 0 refills | Status: AC
Start: 2023-01-10 — End: 2023-01-15

## 2023-01-10 MED ORDER — AEROCHAMBER PLUS FLO-VU MEDIUM MISC
1.0000 | Freq: Once | Status: AC
  Administered 2023-01-10: 1

## 2023-01-10 MED ORDER — ALBUTEROL SULFATE HFA 108 (90 BASE) MCG/ACT IN AERS
2.0000 | INHALATION_SPRAY | Freq: Once | RESPIRATORY_TRACT | Status: AC
Start: 2023-01-10 — End: 2023-01-10
  Administered 2023-01-10: 2 via RESPIRATORY_TRACT

## 2023-01-10 NOTE — ED Triage Notes (Signed)
Patient seen earlier this week for re-eval of continued symptoms of cough, sore throat, ear pressure.

## 2023-01-10 NOTE — ED Provider Notes (Signed)
[name]    CSN: [id number] Arrival date & time: 01/10/23  0840      History   Chief Complaint Chief Complaint  Patient presents with   Cough   Wheezing    HPI Teresa Holden is a 37 y.o. female.   Teresa Holden is a 37 y.o. female presenting for chief complaint of Cough and Wheezing that started 6 days ago on Monday January 05, 2023. Cough has worsened since onset over the last 5 days and she now reports intermittent shortness of breath and wheezing with cough. Voice has become hoarse, reports yellow/green nasal congestion, and generalized fatigue. Febrile at beginning of illness. She was seen at urgent care 5 days ago where she tested negative for COVID-19 via PCR and point of care influenza. Seen at urgent care again 3 days ago and given doxycycline antibiotic for sinusitis. States doxycycline helped with symptoms significantly, however cough has worsened. Denies CP, palpitations, N/V/D, abdominal pain, and rash. Husband and daughter are also sick. No history of asthma, never smoker. History of Sjogren's disease, sees rheumatology. Taking OTC meds and promethazine DM/tessalon perles without relief.      Past Medical History:  Diagnosis Date   ADD (attention deficit disorder with hyperactivity)    Adverse effect of unspecified general anesthetics, subsequent encounter    difficulty waking up from anesthesia   Family history of anesthesia complication    mother---  hard to wake   GAD (generalized anxiety disorder)    GERD (gastroesophageal reflux disease)    Hypermobility arthralgia    followed by dr Corliss Skains---     Migraines    neurologist--- dr Lucia Gaskins   PONV (postoperative nausea and vomiting)    Sjogren's disease (HCC)    rheumotologist-- dr s. 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 Izora Ribas   CHOLECYSTECTOMY N/A 03/07/2016   Procedure: LAPAROSCOPIC CHOLECYSTECTOMY;  Surgeon: Abigail Miyamoto, MD;  Location: [facility];  Service: General;  Laterality: N/A;   HYSTEROSCOPY N/A 10/15/2013   Procedure: HYSTEROSCOPY with removal of IUD;  Surgeon: Annamaria Boots, MD;  Location: [facility];  Service: Gynecology;  Laterality: N/A;   LAPAROSCOPIC BILATERAL SALPINGECTOMY Bilateral 01/04/2020   Procedure: LAPAROSCOPIC BILATERAL SALPINGECTOMY;  Surgeon: Romualdo Bolk, MD;  Location: [facility];  Service: Gynecology;  Laterality: Bilateral;   WISDOM TOOTH EXTRACTION Bilateral 2008    OB History     Gravida  2   Para  2   Term  1   Preterm  0   AB  0   Living  2      SAB  0   IAB  0   Ectopic  0   Multiple  0   Live Births  2            Home Medications    Prior to Admission medications   Medication Sig Start Date End Date Taking? Authorizing Provider  predniSONE (  DELTASONE) 20 MG tablet Take 2 tablets (40 mg total) by mouth daily with breakfast for 5 days. 01/10/23 01/15/23 Yes StanhopeDonavan Burnet, FNP  benzonatate (TESSALON) 100 MG capsule Take 1 capsule (100 mg total) by mouth every 8 (eight) hours. 01/06/23   Carlisle Beers, FNP  colestipol (COLESTID) 1 g tablet Take 1 g by mouth 2 (two) times daily. 12/17/22   [provider]  diclofenac (VOLTAREN) 75 MG EC tablet Take 1 tablet (75 mg total) by mouth 2 (two) times daily. 12/04/22   McCaughan, Dia D, DPM  doxycycline (VIBRAMYCIN) 100 MG capsule Take 1  capsule (100 mg total) by mouth 2 (two) times daily for 7 days. 01/08/23 01/15/23  Meliton Rattan, PA  famotidine (PEPCID) 20 MG tablet Take 20 mg by mouth as needed for heartburn or indigestion.     [provider]  hydrocortisone 2.5 % cream Apply topically as needed. 09/06/20   [provider]  promethazine-dextromethorphan (PROMETHAZINE-DM) 6.25-15 MG/5ML syrup Take 5 mLs by mouth at bedtime as needed for cough. 01/06/23   Carlisle Beers, FNP    Family History Family History  Problem Relation Age of Onset   Sarcoidosis Mother    Diabetes Mother    Diabetes Father    Diabetes Brother    Healthy Son    Heart disease Maternal Grandfather    Diabetes Paternal Grandfather    Heart disease Paternal Grandfather    Healthy Daughter    Healthy Son     Social History Social History   Tobacco Use   Smoking status: Never   Smokeless tobacco: Never  Vaping Use   Vaping status: Never Used  Substance Use Topics   Alcohol use: No    Alcohol/week: 0.0 standard drinks of alcohol   Drug use: Never     Allergies   Penicillins   Review of Systems Review of Systems Per HPI  Physical Exam Triage Vital Signs ED Triage Vitals  Encounter Vitals Group     BP      Systolic BP Percentile      Diastolic BP Percentile      Pulse      Resp      Temp      Temp src      SpO2      Weight      Height      Head Circumference      Peak Flow      Pain Score      Pain Loc      Pain Education      Exclude from Growth Chart    No data found.  Updated Vital Signs BP 133/81 (BP Location: Left Arm)   Pulse 99   Temp 98.3 F (36.8 C) (Oral)   LMP 01/05/2023   SpO2 96%   Visual Acuity Right Eye Distance:   Left Eye Distance:   Bilateral Distance:    Right Eye Near:   Left Eye Near:    Bilateral Near:     Physical Exam Vitals and nursing note reviewed.  Constitutional:      Appearance: She is not ill-appearing or toxic-appearing.  HENT:     Head:  Normocephalic and atraumatic.     Right Ear: Hearing, ear canal and external ear normal. Tympanic membrane is bulging.     Left Ear: Hearing, ear canal and external ear normal. Tympanic membrane is bulging.     Nose: Nose normal.     Mouth/Throat:  Lips: Pink.     Mouth: Mucous membranes are moist. No injury.     Tongue: No lesions. Tongue does not deviate from midline.     Palate: No mass and lesions.     Pharynx: Oropharynx is clear. Uvula midline. Posterior oropharyngeal erythema present. No pharyngeal swelling, oropharyngeal exudate or uvula swelling.     Tonsils: No tonsillar exudate or tonsillar abscesses.  Eyes:     General: Lids are normal. Vision grossly intact. Gaze aligned appropriately.     Extraocular Movements: Extraocular movements intact.     Conjunctiva/sclera: Conjunctivae normal.  Cardiovascular:     Rate and Rhythm: Normal rate and regular rhythm.     Heart sounds: Normal heart sounds, S1 normal and S2 normal.  Pulmonary:     Effort: Pulmonary effort is normal. No respiratory distress.     Breath sounds: Normal air entry. Wheezing (Faint expiratory wheeze heard to the bilateral lower lung fields, harsh and dry cough with exam) present. No rhonchi or rales.  Chest:     Chest wall: No tenderness.  Musculoskeletal:     Cervical back: Neck supple.  Skin:    General: Skin is warm and dry.     Capillary Refill: Capillary refill takes less than 2 seconds.     Findings: No rash.  Neurological:     General: No focal deficit present.     Mental Status: She is alert and oriented to person, place, and time. Mental status is at baseline.     Cranial Nerves: No dysarthria or facial asymmetry.  Psychiatric:        Mood and Affect: Mood normal.        Speech: Speech normal.        Behavior: Behavior normal.        Thought Content: Thought content normal.        Judgment: Judgment normal.      UC Treatments / Results  Labs (all labs ordered are listed, but only  abnormal results are displayed) Labs Reviewed - No data to display  EKG   Radiology No results found.  Procedures Procedures (including critical care time)  Medications Ordered in UC Medications  albuterol (VENTOLIN HFA) 108 (90 Base) MCG/ACT inhaler 2 puff (2 puffs Inhalation Given 01/10/23 0904)  AeroChamber Plus Flo-Vu Medium MISC 1 each (1 each Other Given 01/10/23 0904)    Initial Impression / Assessment and Plan / UC Course  I have reviewed the triage vital signs and the nursing notes.  Pertinent labs & imaging results that were available during my care of the patient were reviewed by me and considered in my medical decision making (see chart for details).   1. Acute bronchitis, shortness of breath Suspect bronchitis has developed, will treat with albuterol as needed and prednisone burst. Continue antibiotic to cover for sinusitis and atypical lung infection as prescribed at previous visit. Continue promethazine DM and tessalon perles as needed. Vital signs hemodynamically stable, respiratory rate 18, afebrile, no new oxygen requirement, therefore deferred imaging of the chest.  PCP follow-up encouraged in the next 4-5 days.  Counseled patient on potential for adverse effects with medications prescribed/recommended today, strict ER and return-to-clinic precautions discussed, patient verbalized understanding.    Final Clinical Impressions(s) / UC Diagnoses   Final diagnoses:  Acute bronchitis, unspecified organism  Shortness of breath     Discharge Instructions      You have bronchitis which is inflammation of the upper airways in your lungs due to a  virus. The following medicines will help with your symptoms.   Continue antibiotic.  - Take steroid pills sent to pharmacy as directed. Do not take any other NSAID containing medications such as ibuprofen or naproxen/Aleve while taking prednisone. - You may use albuterol inhaler 1 to 2 puffs every 4-6 hours as needed for  cough, shortness of breath, and wheezing. - Take cough medicines as prescribed.  - Continue using over the counter medicines as needed as directed. Plain mucinex (guaifenesin) over the counter may further help breakup mucus and help with symptoms.   If you develop any new or worsening symptoms or do not improve in the next 2 to 3 days, please return.  If your symptoms are severe, please go to the emergency room. Follow-up with PCP as needed.     ED Prescriptions     Medication Sig Dispense Auth. Provider   predniSONE (DELTASONE) 20 MG tablet Take 2 tablets (40 mg total) by mouth daily with breakfast for 5 days. 10 tablet Carlisle Beers, FNP      PDMP not reviewed this encounter.   Carlisle Beers, [place] 01/10/23 [phone]

## 2023-01-10 NOTE — Discharge Instructions (Signed)
You have bronchitis which is inflammation of the upper airways in your lungs due to a virus. The following medicines will help with your symptoms.   Continue antibiotic.  - Take steroid pills sent to pharmacy as directed. Do not take any other NSAID containing medications such as ibuprofen or naproxen/Aleve while taking prednisone. - You may use albuterol inhaler 1 to 2 puffs every 4-6 hours as needed for cough, shortness of breath, and wheezing. - Take cough medicines as prescribed.  - Continue using over the counter medicines as needed as directed. Plain mucinex (guaifenesin) over the counter may further help breakup mucus and help with symptoms.   If you develop any new or worsening symptoms or do not improve in the next 2 to 3 days, please return.  If your symptoms are severe, please go to the emergency room. Follow-up with PCP as needed.

## 2023-01-17 ENCOUNTER — Ambulatory Visit (HOSPITAL_BASED_OUTPATIENT_CLINIC_OR_DEPARTMENT_OTHER)
Admission: EM | Admit: 2023-01-17 | Discharge: 2023-01-17 | Disposition: A | Discharge status: Home / Self Care | Payer: Commercial Managed Care - HMO | Attending: Internal Medicine | Admitting: Internal Medicine

## 2023-01-17 ENCOUNTER — Encounter (HOSPITAL_BASED_OUTPATIENT_CLINIC_OR_DEPARTMENT_OTHER): Payer: Self-pay | Admitting: Emergency Medicine

## 2023-01-17 DIAGNOSIS — B349 Viral infection, unspecified: Secondary | ICD-10-CM | POA: Diagnosis not present

## 2023-01-17 MED ORDER — PREDNISONE 10 MG (21) PO TBPK: ORAL_TABLET | ORAL | 0 refills | Status: DC

## 2023-01-17 NOTE — ED Triage Notes (Signed)
Pt was seen on 12/6 she finished her antibiotic on Tuesday and the steroid on Wednesday, still having coughing, congestion, and some sore throat not as bad.

## 2023-01-17 NOTE — Discharge Instructions (Addendum)
Continue using the inhaler when needed for wheezing. Over-the-counter medications as discussedFor symptomatic relief If the wheezing worsens you can start the steroids as written Recheck by family doctor in 1 week if no improvement Recheck sooner for development of fever, worsening symptoms or concerns

## 2023-01-17 NOTE — ED Provider Notes (Signed)
[facility]    CSN: [id number] Arrival date & time: 01/17/23  4403      History   Chief Complaint No chief complaint on file.   HPI Teresa Holden is a 37 y.o. female.   The history is provided by the patient.   Has been sick for several weeks, completed a course of antibiotics for sinus infection, completed a course of steroids for bronchitis, also using an albuterol inhaler.  Using inhaler several times a day.  Continues with nonproductive cough, congestion in her nose and ears, some sore throat Admits multiple household members with similar symptoms.  Denies fever, chills, sweats, chest pain, shortness of breath, nausea, vomiting, diarrhea Past Medical History:  Diagnosis Date   ADD (attention deficit disorder with hyperactivity)    Adverse effect of unspecified general anesthetics, subsequent encounter    difficulty waking up from anesthesia   Family history of anesthesia complication    mother---  hard to wake   GAD (generalized anxiety disorder)    GERD (gastroesophageal reflux disease)    Hypermobility arthralgia    followed by dr Corliss Skains---     Migraines    neurologist--- dr Lucia Gaskins   PONV (postoperative nausea and vomiting)    Sjogren's disease (HCC)    rheumotologist-- dr s. 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 Izora Ribas   CHOLECYSTECTOMY N/A 03/07/2016   Procedure: LAPAROSCOPIC  CHOLECYSTECTOMY;  Surgeon: Abigail Miyamoto, MD;  Location: [facility];  Service: General;  Laterality: N/A;   HYSTEROSCOPY N/A 10/15/2013   Procedure: HYSTEROSCOPY with removal of IUD;  Surgeon: Annamaria Boots, MD;  Location: [facility];  Service: Gynecology;  Laterality: N/A;   LAPAROSCOPIC BILATERAL SALPINGECTOMY Bilateral 01/04/2020   Procedure: LAPAROSCOPIC BILATERAL SALPINGECTOMY;  Surgeon: Romualdo Bolk, MD;  Location: [facility];  Service: Gynecology;  Laterality: Bilateral;   WISDOM TOOTH EXTRACTION Bilateral 2008    OB History     Gravida  2   Para  2   Term  1   Preterm  0   AB  0   Living  2      SAB  0   IAB  0   Ectopic  0   Multiple  0   Live Births  2            Home Medications    Prior to Admission medications   Medication Sig Start Date End Date Taking? Authorizing Provider  predniSONE (STERAPRED UNI-PAK 21 TAB) 10 MG (21) TBPK tablet Take as directed 01/17/23  Yes Mychael Soots, Marylene Land, PA  benzonatate (TESSALON) 100 MG capsule Take 1 capsule (100 mg total) by mouth every 8 (eight) hours. 01/06/23   Carlisle Beers, FNP  colestipol (COLESTID) 1 g tablet Take 1 g by mouth 2 (two) times daily. 12/17/22   [provider]  diclofenac (VOLTAREN) 75 MG EC tablet Take 1 tablet (75 mg total) by mouth 2 (two) times  daily. 12/04/22   McCaughan, Dia D, DPM  famotidine (PEPCID) 20 MG tablet Take 20 mg by mouth as needed for heartburn or indigestion.     [provider]  hydrocortisone 2.5 % cream Apply topically as needed. 09/06/20   [provider]  promethazine-dextromethorphan (PROMETHAZINE-DM) 6.25-15 MG/5ML syrup Take 5 mLs by mouth at bedtime as needed for cough. 01/06/23   Carlisle Beers, FNP    Family History Family History  Problem Relation Age of Onset   Sarcoidosis Mother    Diabetes Mother    Diabetes Father    Diabetes Brother    Healthy Son    Heart disease Maternal  Grandfather    Diabetes Paternal Grandfather    Heart disease Paternal Grandfather    Healthy Daughter    Healthy Son     Social History Social History   Tobacco Use   Smoking status: Never   Smokeless tobacco: Never  Vaping Use   Vaping status: Never Used  Substance Use Topics   Alcohol use: No    Alcohol/week: 0.0 standard drinks of alcohol   Drug use: Never     Allergies   Penicillins   Review of Systems Review of Systems  Constitutional:  Negative for appetite change, diaphoresis, fatigue and fever.  HENT:  Positive for congestion, ear pain (fullness), rhinorrhea and sore throat. Negative for sinus pain and trouble swallowing.   Respiratory:  Positive for wheezing. Shortness of breath: fullness.  Cardiovascular:  Negative for chest pain.  Gastrointestinal:  Negative for abdominal pain, nausea and vomiting.  Neurological:  Negative for headaches.     Physical Exam Triage Vital Signs ED Triage Vitals  Encounter Vitals Group     BP 01/17/23 0838 (!) 139/91     Systolic BP Percentile --      Diastolic BP Percentile --      Pulse Rate 01/17/23 0838 76     Resp 01/17/23 0838 18     Temp 01/17/23 0838 97.9 F (36.6 C)     Temp Source 01/17/23 0838 Oral     SpO2 01/17/23 0838 96 %     Weight --      Height --      Head Circumference --      Peak Flow --      Pain Score 01/17/23 0837 0     Pain Loc --      Pain Education --      Exclude from Growth Chart --    No data found.  Updated Vital Signs BP (!) 139/91 (BP Location: Right Arm)   Pulse 76   Temp 97.9 F (36.6 C) (Oral)   Resp 18   LMP 01/05/2023   SpO2 96%   Visual Acuity Right Eye Distance:   Left Eye Distance:   Bilateral Distance:    Right Eye Near:   Left Eye Near:    Bilateral Near:     Physical Exam Vitals and nursing note reviewed.  Constitutional:      Appearance: She is not ill-appearing.  HENT:     Head: Normocephalic and atraumatic.     Right Ear: Tympanic membrane and  ear canal normal.     Left Ear: Tympanic membrane and ear canal normal.     Nose: Congestion (mild right > left) present. No rhinorrhea.  Eyes:     Conjunctiva/sclera: Conjunctivae normal.  Cardiovascular:     Rate and Rhythm: Normal rate and regular rhythm.     Heart sounds:  Normal heart sounds.  Pulmonary:     Effort: Pulmonary effort is normal. No respiratory distress.     Breath sounds: Normal breath sounds. No wheezing, rhonchi or rales.  Musculoskeletal:     Cervical back: Neck supple.  Lymphadenopathy:     Cervical: No cervical adenopathy.  Skin:    General: Skin is warm and dry.  Neurological:     Mental Status: She is alert.      UC Treatments / Results  Labs (all labs ordered are listed, but only abnormal results are displayed) Labs Reviewed - No data to display  EKG   Radiology No results found.  Procedures Procedures (including critical care time)  Medications Ordered in UC Medications - No data to display  Initial Impression / Assessment and Plan / UC Course  I have reviewed the triage vital signs and the nursing notes.  Pertinent labs & imaging results that were available during my care of the patient were reviewed by me and considered in my medical decision making (see chart for details).     37 year old female with continued symptoms of cough and congestion, completed a course of antibiotics and steroid exposed to multiple household contacts with similar symptoms.  Discussed with patient suspect viral illness continue using inhaler as needed if wheezing worsens can fill paper prescription for steroid Dosepak, follow-up for fever, chest pain, shortness of breath, concerns Final Clinical Impressions(s) / UC Diagnoses   Final diagnoses:  Viral illness     Discharge Instructions      Continue using the inhaler when needed for wheezing. Over-the-counter medications as discussedFor symptomatic relief If the wheezing worsens you can start the  steroids as written Recheck by family doctor in 1 week if no improvement Recheck sooner for development of fever, worsening symptoms or concerns   ED Prescriptions     Medication Sig Dispense Auth. Provider   predniSONE (STERAPRED UNI-PAK 21 TAB) 10 MG (21) TBPK tablet Take as directed 21 tablet Meliton Rattan, PA      PDMP not reviewed this encounter.   Meliton Rattan, [place] 01/17/23 [phone]

## 2023-02-19 ENCOUNTER — Ambulatory Visit (HOSPITAL_BASED_OUTPATIENT_CLINIC_OR_DEPARTMENT_OTHER)
Admission: EM | Admit: 2023-02-19 | Discharge: 2023-02-19 | Disposition: A | Discharge status: Home / Self Care | Payer: Commercial Managed Care - HMO | Attending: Family Medicine | Admitting: Family Medicine

## 2023-02-19 ENCOUNTER — Encounter (HOSPITAL_BASED_OUTPATIENT_CLINIC_OR_DEPARTMENT_OTHER): Payer: Self-pay | Admitting: Emergency Medicine

## 2023-02-19 DIAGNOSIS — J069 Acute upper respiratory infection, unspecified: Secondary | ICD-10-CM

## 2023-02-19 DIAGNOSIS — B3731 Acute candidiasis of vulva and vagina: Secondary | ICD-10-CM

## 2023-02-19 DIAGNOSIS — H66003 Acute suppurative otitis media without spontaneous rupture of ear drum, bilateral: Secondary | ICD-10-CM | POA: Diagnosis not present

## 2023-02-19 MED ORDER — FLUTICASONE PROPIONATE 50 MCG/ACT NA SUSP: 1.0000 | Freq: Two times a day (BID) | NASAL | 0 refills | Status: AC | PRN

## 2023-02-19 MED ORDER — CEFDINIR 300 MG PO CAPS: 300.0000 mg | ORAL_CAPSULE | Freq: Two times a day (BID) | ORAL | 0 refills | Status: AC

## 2023-02-19 MED ORDER — FLUCONAZOLE 150 MG PO TABS: ORAL_TABLET | ORAL | 0 refills | Status: DC

## 2023-02-19 MED ORDER — ALBUTEROL SULFATE HFA 108 (90 BASE) MCG/ACT IN AERS: 2.0000 | INHALATION_SPRAY | RESPIRATORY_TRACT | 0 refills | Status: AC | PRN

## 2023-02-19 NOTE — ED Provider Notes (Signed)
 [facility]    CSN: [id number] Arrival date & time: 02/19/23  1023      History   Chief Complaint Chief Complaint  Patient presents with   Nasal Congestion   Cough    HPI Teresa Holden is a 38 y.o. female.   Here with report of head congestion, nasal discharge, ear pain, slight dizzy headedness with movement of her head, fullness in her ears when she moves her head.  Symptoms for 3 to 4 days.  In December she went through 2 rounds of antibiotics and steroids for significant infection and she is afraid that she is starting with those kind of symptoms again.  Denies fever, nausea, vomiting, diarrhea, constipation, body aches.   Cough Associated symptoms: rhinorrhea   Associated symptoms: no chest pain, no chills, no ear pain, no fever, no rash, no shortness of breath, no sore throat and no wheezing     Past Medical History:  Diagnosis Date   ADD (attention deficit disorder with hyperactivity)    Adverse effect of unspecified general anesthetics, subsequent encounter    difficulty waking up from anesthesia   Family history of anesthesia complication    mother---  hard to wake   GAD (generalized anxiety disorder)    GERD (gastroesophageal reflux disease)    Hypermobility arthralgia    followed by Teresa Holden---     Migraines    neurologist--- Teresa Holden   PONV (postoperative nausea and vomiting)    Sjogren's disease (HCC)    rheumotologist-- Teresa Holden---  chronic sicca symptoms    Patient Active Problem List   Diagnosis Date Noted   Sesamoiditis of right foot 11/15/2022   History of bilateral salpingectomy 01/18/2020   Oral contraceptive use 01/04/2019   Attention deficit hyperactivity disorder (ADHD), predominantly inattentive type 04/14/2018   Chronic fatigue 04/14/2018   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 Jonna Holden   CHOLECYSTECTOMY N/A 03/07/2016   Procedure: LAPAROSCOPIC CHOLECYSTECTOMY;  Surgeon: Teresa Blumenthal, Holden;  Location: [facility];  Service: General;  Laterality: N/A;   HYSTEROSCOPY N/A 10/15/2013   Procedure: HYSTEROSCOPY with removal of IUD;  Surgeon: Teresa Bohr, Holden;  Location: [facility];  Service: Gynecology;  Laterality: N/A;   LAPAROSCOPIC BILATERAL SALPINGECTOMY Bilateral 01/04/2020   Procedure: LAPAROSCOPIC BILATERAL SALPINGECTOMY;  Surgeon: Teresa Gutta, Holden;  Location: [facility];  Service: Gynecology;  Laterality: Bilateral;   WISDOM TOOTH EXTRACTION Bilateral 2008    OB History     Gravida  2   Para  2   Term  1   Preterm  0   AB  0   Living  2      SAB  0   IAB  0   Ectopic  0   Multiple  0   Live Births  2            Home Medications    Prior to Admission medications   Medication Sig Start Date End Date Taking? Authorizing Provider  albuterol  (VENTOLIN  HFA) 108 (90 Base) MCG/ACT inhaler Inhale 2 puffs into the lungs every 4 (four) hours as needed for wheezing or shortness of breath. 02/19/23 03/21/23 Yes Teresa Legacy, FNP  cefdinir  (OMNICEF ) 300 MG capsule Take 1 capsule (300  mg total) by mouth 2 (two) times daily for 7 days. 02/19/23 02/26/23 Yes Teresa Legacy, FNP  fluconazole  (DIFLUCAN ) 150 MG tablet By mouth today and repeat every 5 days for vaginal yeast infection.  May use up to four pills, if needed 02/19/23  Yes Teresa Legacy, FNP  fluticasone  (FLONASE ) 50 MCG/ACT nasal spray Place 1 spray into both nostrils 2 (two) times daily as needed for rhinitis. 02/19/23 03/21/23 Yes Teresa Legacy, FNP  benzonatate  (TESSALON ) 100 MG capsule Take 1 capsule (100 mg total) by mouth every 8 (eight) hours. 01/06/23   Teresa Eaton, FNP  colestipol (COLESTID) 1 g tablet Take 1 g by mouth 2 (two) times daily.  12/17/22   Teresa Holden  diclofenac  (VOLTAREN ) 75 MG EC tablet Take 1 tablet (75 mg total) by mouth 2 (two) times daily. 12/04/22   Teresa Holden, Teresa Holden  famotidine  (PEPCID ) 20 MG tablet Take 20 mg by mouth as needed for heartburn or indigestion.     Teresa Holden  hydrocortisone 2.5 % cream Apply topically as needed. 09/06/20   Teresa Holden  promethazine -dextromethorphan (PROMETHAZINE -DM) 6.25-15 MG/5ML syrup Take 5 mLs by mouth at bedtime as needed for cough. 01/06/23   Teresa Eaton, FNP    Family History Family History  Problem Relation Age of Onset   Sarcoidosis Mother    Diabetes Mother    Diabetes Father    Diabetes Brother    Healthy Son    Heart disease Maternal Grandfather    Diabetes Paternal Grandfather    Heart disease Paternal Grandfather    Healthy Daughter    Healthy Son     Social History Social History   Tobacco Use   Smoking status: Never   Smokeless tobacco: Never  Vaping Use   Vaping status: Never Used  Substance Use Topics   Alcohol use: No    Alcohol/week: 0.0 standard drinks of alcohol   Drug use: Never     Allergies   Penicillins   Review of Systems Review of Systems  Constitutional:  Negative for chills and fever.  HENT:  Positive for congestion, postnasal drip, rhinorrhea, sinus pressure and sinus pain. Negative for ear pain and sore throat.   Eyes:  Negative for pain and visual disturbance.  Respiratory:  Positive for cough and chest tightness. Negative for shortness of breath and wheezing.   Cardiovascular:  Negative for chest pain and palpitations.  Gastrointestinal:  Negative for abdominal pain, constipation, diarrhea, nausea and vomiting.  Genitourinary:  Negative for dysuria and hematuria.  Musculoskeletal:  Negative for arthralgias and back pain.  Skin:  Negative for color change and rash.  Neurological:  Negative for seizures and syncope.  All other systems reviewed and are  negative.    Physical Exam Triage Vital Signs ED Triage Vitals  Encounter Vitals Group     BP 02/19/23 1055 125/81     Systolic BP Percentile --      Diastolic BP Percentile --      Pulse Rate 02/19/23 1055 71     Resp 02/19/23 1055 18     Temp 02/19/23 1055 98.4 F (36.9 C)     Temp Source 02/19/23 1055 Oral     SpO2 02/19/23 1055 98 %     Weight --      Height --      Head Circumference --      Peak Flow --      Pain Score 02/19/23 1054 0  Pain Loc --      Pain Education --      Exclude from Growth Chart --    No data found.  Updated Vital Signs BP 125/81 (BP Location: Right Arm)   Pulse 71   Temp 98.4 F (36.9 C) (Oral)   Resp 18   SpO2 98%   Visual Acuity Right Eye Distance:   Left Eye Distance:   Bilateral Distance:    Right Eye Near:   Left Eye Near:    Bilateral Near:     Physical Exam Constitutional:      Appearance: Normal appearance. She is ill-appearing. She is not toxic-appearing.  HENT:     Head: Normocephalic.     Right Ear: Hearing, ear canal and external ear normal. A middle ear effusion is present. Tympanic membrane is not erythematous.     Left Ear: Hearing, ear canal and external ear normal. A middle ear effusion is present. Tympanic membrane is erythematous.     Nose: Mucosal edema, congestion and rhinorrhea present. Rhinorrhea is clear.     Right Turbinates: Swollen.     Left Turbinates: Swollen.     Mouth/Throat:     Lips: Pink.     Mouth: Mucous membranes are moist.     Pharynx: Uvula midline. No oropharyngeal exudate or posterior oropharyngeal erythema.     Tonsils: No tonsillar exudate or tonsillar abscesses.  Eyes:     General: Lids are normal. Vision grossly intact.     Conjunctiva/sclera: Conjunctivae normal.     Pupils: Pupils are equal, round, and reactive to light.  Cardiovascular:     Rate and Rhythm: Normal rate and regular rhythm.     Heart sounds: Normal heart sounds, S1 normal and S2 normal. No murmur  heard. Pulmonary:     Effort: Pulmonary effort is normal.     Breath sounds: Normal breath sounds. No decreased breath sounds, wheezing, rhonchi or rales.  Abdominal:     General: Abdomen is flat. Bowel sounds are normal.     Palpations: Abdomen is soft.     Tenderness: There is no abdominal tenderness.  Musculoskeletal:     Cervical back: Normal range of motion and neck supple.  Lymphadenopathy:     Head:     Right side of head: Submandibular and posterior auricular adenopathy present. No submental, tonsillar or preauricular adenopathy.     Left side of head: Submandibular and posterior auricular adenopathy present. No submental, tonsillar or preauricular adenopathy.     Cervical: Cervical adenopathy present.     Right cervical: Superficial cervical adenopathy present.     Left cervical: Superficial cervical adenopathy present.  Skin:    General: Skin is warm and dry.     Findings: No rash.  Neurological:     Mental Status: She is alert and oriented to person, place, and time.     Gait: Gait normal.  Psychiatric:        Mood and Affect: Mood normal.        Behavior: Behavior normal.      UC Treatments / Results  Labs (all labs ordered are listed, but only abnormal results are displayed) Labs Reviewed - No data to display  EKG   Radiology No results found.  Procedures Procedures (including critical care time)  Medications Ordered in UC Medications - No data to display  Initial Impression / Assessment and Plan / UC Course  I have reviewed the triage vital signs and the nursing notes.  Pertinent labs &  imaging results that were available during my care of the patient were reviewed by me and considered in my medical decision making (see chart for details).  Otitis media: Will treat with cefdinir , 300 mg, twice daily for 7 days.  Encouraged fluticasone , 1 spray into each nostril, once or twice daily for nasal congestion and to open the ear canals.  Viral upper  respiratory infection: Renewed albuterol  inhaler, 2 puffs, every 4 hours, as needed for wheezing.  Vaginal yeast infection: Patient reports she often gets yeast infections with antibiotics and did have a yeast infection last time she used cefdinir .  Provided fluconazole , 150 mg, 1 pill, every 5 days, up to a total of 4 pills, if needed for yeast infection.  Encouraged use of OTC probiotics or to eat yogurt daily.  Follow-up here if symptoms do not improve, worsen or new symptoms occur. Final Clinical Impressions(s) / UC Diagnoses   Final diagnoses:  Non-recurrent acute suppurative otitis media of both ears without spontaneous rupture of tympanic membranes  Viral URI with cough  Vaginal candidiasis     Discharge Instructions      Exam shows bilateral ear infections.  She has had the oral steroids in December so we will defer any systemic steroids for now.  Encouraged fluticasone  nasal spray, 1 spray into each nostril, once or twice daily for nasal congestion.  Renewed her albuterol  inhaler, 2 puffs, every 4 hours, as needed for wheezing.  Rinse mouth after use.  Cefdinir , 300 mg, 1 twice daily for 7 days.  We discussed that she has a penicillin allergy but many with penicillin allergies tolerate cephalosporins.  She has taken cefdinir  previously but not recently and tolerated it well.  Provided fluconazole , 150 mg, 1 pill, every 5 days, for vaginal yeast infection, up to 4 pills total.  Patient reports she gets yeast infections with some antibiotics and has definitely gotten a yeast infection after using cefdinir .  Increase fluids.  Get plenty of rest.  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  Return here if symptoms do not improve, worsen, or new symptoms occur.     ED Prescriptions     Medication Sig Dispense Auth. Provider   albuterol  (VENTOLIN  HFA) 108 (90 Base) MCG/ACT inhaler Inhale 2 puffs into the lungs every 4 (four) hours as needed for wheezing or shortness of  breath. 2.82 each Teresa Legacy, FNP   cefdinir  (OMNICEF ) 300 MG capsule Take 1 capsule (300 mg total) by mouth 2 (two) times daily for 7 days. 14 capsule Teresa Legacy, FNP   fluconazole  (DIFLUCAN ) 150 MG tablet By mouth today and repeat every 5 days for vaginal yeast infection.  May use up to four pills, if needed 4 tablet Teresa Legacy, FNP   fluticasone  (FLONASE ) 50 MCG/ACT nasal spray Place 1 spray into both nostrils 2 (two) times daily as needed for rhinitis. 17 mL Teresa Legacy, FNP      PDMP not reviewed this encounter.   Teresa Legacy, FNP 02/19/23 1140

## 2023-02-19 NOTE — Discharge Instructions (Addendum)
 Exam shows bilateral ear infections.  She has had the oral steroids in December so we will defer any systemic steroids for now.  Encouraged fluticasone  nasal spray, 1 spray into each nostril, once or twice daily for nasal congestion.  Renewed her albuterol  inhaler, 2 puffs, every 4 hours, as needed for wheezing.  Rinse mouth after use.  Cefdinir , 300 mg, 1 twice daily for 7 days.  We discussed that she has a penicillin allergy but many with penicillin allergies tolerate cephalosporins.  She has taken cefdinir  previously but not recently and tolerated it well.  Provided fluconazole , 150 mg, 1 pill, every 5 days, for vaginal yeast infection, up to 4 pills total.  Patient reports she gets yeast infections with some antibiotics and has definitely gotten a yeast infection after using cefdinir .  Increase fluids.  Get plenty of rest.  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  Return here if symptoms do not improve, worsen, or new symptoms occur.

## 2023-02-19 NOTE — ED Triage Notes (Signed)
 Pt c/o x 3 days ago she started having sore throat, sinus congestion, coughing.

## 2023-04-08 ENCOUNTER — Ambulatory Visit (HOSPITAL_BASED_OUTPATIENT_CLINIC_OR_DEPARTMENT_OTHER)
Admission: EM | Admit: 2023-04-08 | Discharge: 2023-04-08 | Disposition: A | Discharge status: Home / Self Care | Attending: Family Medicine | Admitting: Family Medicine

## 2023-04-08 ENCOUNTER — Encounter (HOSPITAL_BASED_OUTPATIENT_CLINIC_OR_DEPARTMENT_OTHER): Payer: Self-pay | Admitting: Emergency Medicine

## 2023-04-08 DIAGNOSIS — J069 Acute upper respiratory infection, unspecified: Secondary | ICD-10-CM | POA: Diagnosis not present

## 2023-04-08 DIAGNOSIS — R051 Acute cough: Secondary | ICD-10-CM

## 2023-04-08 LAB — POC COVID19/FLU A&B COMBO
Covid Antigen, POC: NEGATIVE
Influenza A Antigen, POC: NEGATIVE
Influenza B Antigen, POC: NEGATIVE

## 2023-04-08 MED ORDER — PROMETHAZINE-DM 6.25-15 MG/5ML PO SYRP: 5.0000 mL | ORAL_SOLUTION | Freq: Every evening | ORAL | 0 refills | Status: DC | PRN

## 2023-04-08 NOTE — Discharge Instructions (Signed)
 Negative for flu and COVID.  It may be too soon for test to become positive.  Encouraged to get a home COVID flu test and check herself tomorrow.  Follow-up here if she finds she is positive with flu or COVID and wants treatment.  Get plenty of fluids and rest.  Promethazine DM, 5 mL, every 6 hours as needed for cough.

## 2023-04-08 NOTE — ED Triage Notes (Signed)
 Pt reports headache, body ache, cough, sore throat started yesterday afternoon

## 2023-04-08 NOTE — ED Provider Notes (Signed)
 [name]    CSN: [id number] Arrival date & time: 04/08/23  9629      History   Chief Complaint No chief complaint on file.   HPI Teresa Holden is a 38 y.o. female.   Patient reports that she develops symptoms yesterday midday (04/07/2023.  She had sneezing, cough, sore throat, left earache, chills, body aches.  She vomited once yesterday after coughing really hard.  She has had 2 loose stools today but not persistent diarrhea.  Denies nausea and vomiting and constipation today.     Past Medical History:  Diagnosis Date   ADD (attention deficit disorder with hyperactivity)    Adverse effect of unspecified general anesthetics, subsequent encounter    difficulty waking up from anesthesia   Family history of anesthesia complication    mother---  hard to wake   GAD (generalized anxiety disorder)    GERD (gastroesophageal reflux disease)    Hypermobility arthralgia    followed by dr Corliss Skains---     Migraines    neurologist--- dr Lucia Gaskins   PONV (postoperative nausea and vomiting)    Sjogren's disease (HCC)    rheumotologist-- dr s. 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 Izora Ribas   CHOLECYSTECTOMY N/A 03/07/2016   Procedure: LAPAROSCOPIC CHOLECYSTECTOMY;  Surgeon: Abigail Miyamoto, MD;  Location: [facility];  Service: General;  Laterality: N/A;   HYSTEROSCOPY N/A 10/15/2013   Procedure:  HYSTEROSCOPY with removal of IUD;  Surgeon: Annamaria Boots, MD;  Location: [facility];  Service: Gynecology;  Laterality: N/A;   LAPAROSCOPIC BILATERAL SALPINGECTOMY Bilateral 01/04/2020   Procedure: LAPAROSCOPIC BILATERAL SALPINGECTOMY;  Surgeon: Romualdo Bolk, MD;  Location: [facility];  Service: Gynecology;  Laterality: Bilateral;   WISDOM TOOTH EXTRACTION Bilateral 2008    OB History     Gravida  2   Para  2   Term  1   Preterm  0   AB  0   Living  2      SAB  0   IAB  0   Ectopic  0   Multiple  0   Live Births  2            Home Medications    Prior to Admission medications   Medication Sig Start Date End Date Taking? Authorizing Provider  albuterol (VENTOLIN HFA) 108 (90 Base) MCG/ACT inhaler Inhale 2 puffs into the lungs every 4 (four) hours as needed for wheezing or shortness of breath. 02/19/23 03/21/23  Prescilla Sours, FNP  colestipol (COLESTID) 1 g tablet Take 1 g by mouth 2 (two) times daily. 12/17/22   [provider]  diclofenac (VOLTAREN) 75 MG EC tablet Take 1 tablet (75 mg total) by mouth 2 (two) times daily. 12/04/22   McCaughan, Dia D, DPM  famotidine (PEPCID) 20 MG tablet Take 20 mg by mouth as needed for heartburn or indigestion.     [provider]  fluconazole (  DIFLUCAN) 150 MG tablet By mouth today and repeat every 5 days for vaginal yeast infection.  May use up to four pills, if needed 02/19/23   Prescilla Sours, FNP  fluticasone [facility]) 50 MCG/ACT nasal spray Place 1 spray into both nostrils 2 (two) times daily as needed for rhinitis. 02/19/23 03/21/23  Prescilla Sours, FNP  hydrocortisone 2.5 % cream Apply topically as needed. 09/06/20   [provider]  promethazine-dextromethorphan (PROMETHAZINE-DM) 6.25-15 MG/5ML syrup Take 5 mLs by mouth at bedtime as needed for cough. 04/08/23   Prescilla Sours, FNP    Family History Family History  Problem Relation Age of Onset   Sarcoidosis Mother     Diabetes Mother    Diabetes Father    Diabetes Brother    Healthy Son    Heart disease Maternal Grandfather    Diabetes Paternal Grandfather    Heart disease Paternal Grandfather    Healthy Daughter    Healthy Son     Social History Social History   Tobacco Use   Smoking status: Never   Smokeless tobacco: Never  Vaping Use   Vaping status: Never Used  Substance Use Topics   Alcohol use: No    Alcohol/week: 0.0 standard drinks of alcohol   Drug use: Never     Allergies   Penicillins   Review of Systems Review of Systems  Constitutional:  Negative for chills and fever.  HENT:  Positive for congestion, ear pain, sneezing and sore throat.   Eyes:  Negative for pain and visual disturbance.  Respiratory:  Positive for cough. Negative for shortness of breath.   Cardiovascular:  Negative for chest pain and palpitations.  Gastrointestinal:  Positive for diarrhea and vomiting (once after coughing hard). Negative for abdominal pain, constipation and nausea.  Genitourinary:  Negative for dysuria and hematuria.  Musculoskeletal:  Positive for arthralgias. Negative for back pain.  Skin:  Negative for color change and rash.  Neurological:  Negative for seizures and syncope.  All other systems reviewed and are negative.    Physical Exam Triage Vital Signs ED Triage Vitals  Encounter Vitals Group     BP 04/08/23 0957 128/79     Systolic BP Percentile --      Diastolic BP Percentile --      Pulse Rate 04/08/23 0957 99     Resp 04/08/23 0957 18     Temp 04/08/23 0957 98.6 F (37 C)     Temp Source 04/08/23 0957 Oral     SpO2 04/08/23 0957 97 %     Weight --      Height --      Head Circumference --      Peak Flow --      Pain Score 04/08/23 0956 0     Pain Loc --      Pain Education --      Exclude from Growth Chart --    No data found.  Updated Vital Signs BP 128/79 (BP Location: Right Arm)   Pulse 99   Temp 98.6 F (37 C) (Oral)   Resp 18   LMP 03/21/2023    SpO2 97%   Visual Acuity Right Eye Distance:   Left Eye Distance:   Bilateral Distance:    Right Eye Near:   Left Eye Near:    Bilateral Near:     Physical Exam Vitals and nursing note reviewed.  Constitutional:      General: She is not in acute distress.    Appearance:  She is well-developed. She is not ill-appearing or toxic-appearing.  HENT:     Head: Normocephalic and atraumatic.     Right Ear: Hearing, tympanic membrane, ear canal and external ear normal.     Left Ear: Hearing, tympanic membrane, ear canal and external ear normal.     Nose: Congestion and rhinorrhea present. Rhinorrhea is clear.     Right Sinus: No maxillary sinus tenderness or frontal sinus tenderness.     Left Sinus: No maxillary sinus tenderness or frontal sinus tenderness.     Mouth/Throat:     Lips: Pink.     Mouth: Mucous membranes are moist.     Pharynx: Uvula midline. No oropharyngeal exudate or posterior oropharyngeal erythema.     Tonsils: No tonsillar exudate.  Eyes:     Conjunctiva/sclera: Conjunctivae normal.     Pupils: Pupils are equal, round, and reactive to light.  Cardiovascular:     Rate and Rhythm: Normal rate and regular rhythm.     Heart sounds: S1 normal and S2 normal. No murmur heard. Pulmonary:     Effort: Pulmonary effort is normal. No respiratory distress.     Breath sounds: Normal breath sounds. No decreased breath sounds, wheezing, rhonchi or rales.  Abdominal:     General: Bowel sounds are normal.     Palpations: Abdomen is soft.     Tenderness: There is no abdominal tenderness.  Musculoskeletal:        General: No swelling.     Cervical back: Neck supple.  Lymphadenopathy:     Head:     Right side of head: No submental, submandibular, tonsillar, preauricular or posterior auricular adenopathy.     Left side of head: No submental, submandibular, tonsillar, preauricular or posterior auricular adenopathy.     Cervical: No cervical adenopathy.     Right cervical: No  superficial cervical adenopathy.    Left cervical: No superficial cervical adenopathy.  Skin:    General: Skin is warm and dry.     Capillary Refill: Capillary refill takes less than 2 seconds.     Findings: No rash.  Neurological:     Mental Status: She is alert and oriented to person, place, and time.  Psychiatric:        Mood and Affect: Mood normal.      UC Treatments / Results  Labs (all labs ordered are listed, but only abnormal results are displayed) Labs Reviewed  POC COVID19/FLU A&B COMBO - Normal    EKG   Radiology No results found.  Procedures Procedures (including critical care time)  Medications Ordered in UC Medications - No data to display  Initial Impression / Assessment and Plan / UC Course  I have reviewed the triage vital signs and the nursing notes.  Pertinent labs & imaging results that were available during my care of the patient were reviewed by me and considered in my medical decision making (see chart for details).     Negative for flu and COVID.  It may be too soon for positive testing.  Encouraged to get her home flu COVID test and check again tomorrow.  Refilled Promethazine DM, 5 mL, every 6 hours if needed for cough.  Get plenty of fluids and rest.  Follow-up if symptoms do not improve, worsen or new symptoms occur. Final Clinical Impressions(s) / UC Diagnoses   Final diagnoses:  Acute cough  Viral URI with cough     Discharge Instructions      Negative for flu and COVID.  It may be too soon for test to become positive.  Encouraged to get a home COVID flu test and check herself tomorrow.  Follow-up here if she finds she is positive with flu or COVID and wants treatment.  Get plenty of fluids and rest.  Promethazine DM, 5 mL, every 6 hours as needed for cough.     ED Prescriptions     Medication Sig Dispense Auth. Provider   promethazine-dextromethorphan (PROMETHAZINE-DM) 6.25-15 MG/5ML syrup Take 5 mLs by mouth at bedtime as  needed for cough. 118 mL Prescilla Sours, FNP      PDMP not reviewed this encounter.   Prescilla Sours, FNP 04/08/23 1047

## 2023-04-30 NOTE — Progress Notes (Deleted)
 Office Visit Note  Patient: Teresa Holden             Date of Birth: Jan 23, 1986           MRN: [id number]             PCP: Pcp, No Referring: No ref. provider found Visit Date: 05/13/2023 Occupation: @GUAROCC @  Subjective:  No chief complaint on file.   History of Present Illness: Teresa Holden is a 38 y.o. female ***     Activities of Daily Living:  Patient reports morning stiffness for *** {minute/hour:19697}.   Patient {ACTIONS;DENIES/REPORTS:21021675::"Denies"} nocturnal pain.  Difficulty dressing/grooming: {ACTIONS;DENIES/REPORTS:21021675::"Denies"} Difficulty climbing stairs: {ACTIONS;DENIES/REPORTS:21021675::"Denies"} Difficulty getting out of chair: {ACTIONS;DENIES/REPORTS:21021675::"Denies"} Difficulty using hands for taps, buttons, cutlery, and/or writing: {ACTIONS;DENIES/REPORTS:21021675::"Denies"}  No Rheumatology ROS completed.   PMFS History:  Patient Active Problem List   Diagnosis Date Noted   Sesamoiditis of right foot 11/15/2022   History of bilateral salpingectomy 01/18/2020   Oral contraceptive use 01/04/2019   Attention deficit hyperactivity disorder (ADHD), predominantly inattentive type 04/14/2018   Chronic fatigue 04/14/2018   SS-B antibody positive 09/12/2016   Sjogren's syndrome (HCC) 08/29/2016   Chronic migraine without aura without status migrainosus, not intractable 06/10/2015   Anxiety     Past Medical History:  Diagnosis Date   ADD (attention deficit disorder with hyperactivity)    Adverse effect of unspecified general anesthetics, subsequent encounter    difficulty waking up from anesthesia   Family history of anesthesia complication    mother---  hard to wake   GAD (generalized anxiety disorder)    GERD (gastroesophageal reflux disease)    Hypermobility arthralgia    followed by dr Corliss Skains---     Migraines    neurologist--- dr Lucia Gaskins   PONV (postoperative nausea and vomiting)    Sjogren's disease (HCC)     rheumotologist-- dr s. Corliss Skains---  chronic sicca symptoms    Family History  Problem Relation Age of Onset   Sarcoidosis Mother    Diabetes Mother    Diabetes Father    Diabetes Brother    Healthy Son    Heart disease Maternal Grandfather    Diabetes Paternal Grandfather    Heart disease Paternal Grandfather    Healthy Daughter    Healthy Son    Past Surgical History:  Procedure Laterality Date   BREAST ENHANCEMENT SURGERY  2013   Dr. Izora Ribas   CHOLECYSTECTOMY N/A 03/07/2016   Procedure: LAPAROSCOPIC CHOLECYSTECTOMY;  Surgeon: Abigail Miyamoto, MD;  Location: [facility];  Service: General;  Laterality: N/A;   HYSTEROSCOPY N/A 10/15/2013   Procedure: HYSTEROSCOPY with removal of IUD;  Surgeon: Annamaria Boots, MD;  Location: [facility];  Service: Gynecology;  Laterality: N/A;   LAPAROSCOPIC BILATERAL SALPINGECTOMY Bilateral 01/04/2020   Procedure: LAPAROSCOPIC BILATERAL SALPINGECTOMY;  Surgeon: Romualdo Bolk, MD;  Location: [facility];  Service: Gynecology;  Laterality: Bilateral;   WISDOM TOOTH EXTRACTION Bilateral 2008   Social History   Social History Narrative   Lives at home with her husband and children   Right handed   Caffeine: none   Immunization History  Administered Date(s) Administered   Influenza,inj,Quad PF,6+ Mos 11/18/2012   Influenza-Unspecified 11/18/2017, 11/05/2018   Pneumococcal Polysaccharide-23 10/06/2019   Tdap 04/13/2018     Objective: Vital Signs: There were no vitals taken for this visit.   Physical Exam   Musculoskeletal Exam: ***  CDAI Exam: CDAI Score: -- Patient Global: --; Provider Global: -- Swollen: --; Tender: --  Joint Exam 05/13/2023   No joint exam has been documented for this visit   There is currently no information documented on the homunculus. Go to the Rheumatology activity and complete the homunculus joint exam.  Investigation: No additional findings.  Imaging: No results  found.  Recent Labs: Lab Results  Component Value Date   WBC 9.1 09/13/2020   HGB 13.6 09/13/2020   PLT 263 09/13/2020   NA 139 09/13/2020   K 4.0 09/13/2020   CL 103 09/13/2020   CO2 29 09/13/2020   GLUCOSE 87 09/13/2020   BUN 14 09/13/2020   CREATININE 0.77 09/13/2020   BILITOT 0.4 09/13/2020   ALKPHOS 50 04/13/2018   AST 12 09/13/2020   ALT 11 09/13/2020   PROT 6.8 09/13/2020   ALBUMIN 4.3 04/13/2018   CALCIUM 9.4 09/13/2020   GFRAA >60 12/06/2016    Speciality Comments: Plaquenil started on Jun 25, 2018  Procedures:  No procedures performed Allergies: Penicillins   Assessment / Plan:     Visit Diagnoses: No diagnosis found.  Orders: No orders of the defined types were placed in this encounter.  No orders of the defined types were placed in this encounter.   Face-to-face time spent with patient was *** minutes. Greater than 50% of time was spent in counseling and coordination of care.  Follow-Up Instructions: No follow-ups on file.   Ellen Henri, CMA  Note - This record has been created using Animal nutritionist.  Chart creation errors have been sought, but may not always  have been located. Such creation errors do not reflect on  the standard of medical care.

## 2023-05-13 ENCOUNTER — Ambulatory Visit: Admitting: Rheumatology

## 2023-05-13 DIAGNOSIS — M3500 Sicca syndrome, unspecified: Secondary | ICD-10-CM

## 2023-05-13 DIAGNOSIS — F900 Attention-deficit hyperactivity disorder, predominantly inattentive type: Secondary | ICD-10-CM

## 2023-05-13 DIAGNOSIS — G43709 Chronic migraine without aura, not intractable, without status migrainosus: Secondary | ICD-10-CM

## 2023-05-13 DIAGNOSIS — R5383 Other fatigue: Secondary | ICD-10-CM

## 2023-05-13 DIAGNOSIS — Z79899 Other long term (current) drug therapy: Secondary | ICD-10-CM

## 2023-05-13 DIAGNOSIS — M2550 Pain in unspecified joint: Secondary | ICD-10-CM

## 2023-05-13 DIAGNOSIS — R768 Other specified abnormal immunological findings in serum: Secondary | ICD-10-CM

## 2023-05-13 DIAGNOSIS — R21 Rash and other nonspecific skin eruption: Secondary | ICD-10-CM

## 2023-05-13 DIAGNOSIS — Z8659 Personal history of other mental and behavioral disorders: Secondary | ICD-10-CM

## 2023-08-10 ENCOUNTER — Encounter (HOSPITAL_BASED_OUTPATIENT_CLINIC_OR_DEPARTMENT_OTHER): Payer: Self-pay

## 2023-08-10 ENCOUNTER — Ambulatory Visit (HOSPITAL_BASED_OUTPATIENT_CLINIC_OR_DEPARTMENT_OTHER): Admission: EM | Admit: 2023-08-10 | Discharge: 2023-08-10 | Disposition: A | Discharge status: Home / Self Care

## 2023-08-10 DIAGNOSIS — J0100 Acute maxillary sinusitis, unspecified: Secondary | ICD-10-CM

## 2023-08-10 MED ORDER — CEFDINIR 300 MG PO CAPS: 300.0000 mg | ORAL_CAPSULE | Freq: Two times a day (BID) | ORAL | 0 refills | Status: AC

## 2023-08-10 MED ORDER — FLUCONAZOLE 150 MG PO TABS: 150.0000 mg | ORAL_TABLET | Freq: Every day | ORAL | 0 refills | Status: AC

## 2023-08-10 NOTE — ED Triage Notes (Signed)
 States sinus congestion and cough onset 2 weeks ago. Ears have been aching during this time and now pain and pressure especially to left ear. Patient requesting printed script to take to her own pharmacy.

## 2023-08-10 NOTE — Discharge Instructions (Signed)
 Treating you for a sinus infection.  Take the antibiotics as prescribed. Over-the-counter Mucinex  for congestion and cough Follow-up as needed

## 2023-08-13 NOTE — ED Provider Notes (Signed)
 [facility]    CSN: [id number] Arrival date & time: 08/10/23  1158      History   Chief Complaint Chief Complaint  Patient presents with   Cough   sinus congestion   Otalgia    HPI Teresa Holden is a 38 y.o. female.   38 year old presents with sinus congestion and cough. States sinus congestion and cough onset 2 weeks ago. Ears have been aching during this time and now pain and pressure especially to left ear. No fever. She has been taking OTC meds with some relief.    Cough Associated symptoms: ear pain   Otalgia Associated symptoms: cough     Past Medical History:  Diagnosis Date   ADD (attention deficit disorder with hyperactivity)    Adverse effect of unspecified general anesthetics, subsequent encounter    difficulty waking up from anesthesia   Family history of anesthesia complication    mother---  hard to wake   GAD (generalized anxiety disorder)    GERD (gastroesophageal reflux disease)    Hypermobility arthralgia    followed by dr dolphus---     Migraines    neurologist--- dr ines   PONV (postoperative nausea and vomiting)    Sjogren's disease (HCC)    rheumotologist-- dr s. dolphus---  chronic sicca symptoms    Patient Active Problem List   Diagnosis Date Noted   Sesamoiditis of right foot 11/15/2022   History of bilateral salpingectomy 01/18/2020   Oral contraceptive use 01/04/2019   Attention deficit hyperactivity disorder (ADHD), predominantly inattentive type 04/14/2018   Chronic fatigue 04/14/2018   SS-B antibody positive 09/12/2016   Sjogren's syndrome (HCC) 08/29/2016   Chronic migraine without aura without status migrainosus, not intractable 06/10/2015   Anxiety     Past Surgical History:  Procedure Laterality Date   BREAST ENHANCEMENT SURGERY  2013   Dr. Lorretta   CHOLECYSTECTOMY N/A 03/07/2016   Procedure: LAPAROSCOPIC CHOLECYSTECTOMY;  Surgeon: Vicenta Poli, MD;  Location: [facility];  Service:  General;  Laterality: N/A;   HYSTEROSCOPY N/A 10/15/2013   Procedure: HYSTEROSCOPY with removal of IUD;  Surgeon: Ronal Elvie Pinal, MD;  Location: [facility];  Service: Gynecology;  Laterality: N/A;   LAPAROSCOPIC BILATERAL SALPINGECTOMY Bilateral 01/04/2020   Procedure: LAPAROSCOPIC BILATERAL SALPINGECTOMY;  Surgeon: Jannis Kate Norris, MD;  Location: [facility];  Service: Gynecology;  Laterality: Bilateral;   WISDOM TOOTH EXTRACTION Bilateral 2008    OB History     Gravida  2   Para  2   Term  1   Preterm  0   AB  0   Living  2      SAB  0   IAB  0   Ectopic  0   Multiple  0   Live Births  2            Home Medications    Prior to Admission medications   Medication Sig Start Date End Date Taking? Authorizing Provider  cefdinir  (OMNICEF ) 300 MG capsule Take 1 capsule (300 mg total) by mouth 2 (two) times daily for 7 days. 08/10/23 08/17/23 Yes Lacrystal Barbe A, FNP  hydroxychloroquine  (PLAQUENIL ) 200 MG tablet Take 400 mg by mouth daily. 07/28/23  Yes [provider]  albuterol  (VENTOLIN  HFA) 108 (90 Base) MCG/ACT inhaler Inhale 2 puffs into the lungs every 4 (four) hours as needed for wheezing or shortness of breath. 02/19/23 03/21/23  Ival Domino, FNP  famotidine  (PEPCID ) 20 MG tablet  Take 20 mg by mouth as needed for heartburn or indigestion.     [provider]  fluticasone  (FLONASE ) 50 MCG/ACT nasal spray Place 1 spray into both nostrils 2 (two) times daily as needed for rhinitis. 02/19/23 03/21/23  Ival Domino, FNP  hydrocortisone 2.5 % cream Apply topically as needed. 09/06/20   [provider]    Family History Family History  Problem Relation Age of Onset   Sarcoidosis Mother    Diabetes Mother    Diabetes Father    Diabetes Brother    Healthy Son    Heart disease Maternal Grandfather    Diabetes Paternal Grandfather    Heart disease Paternal Grandfather    Healthy Daughter    Healthy Son     Social  History Social History   Tobacco Use   Smoking status: Never   Smokeless tobacco: Never  Vaping Use   Vaping status: Never Used  Substance Use Topics   Alcohol use: No    Alcohol/week: 0.0 standard drinks of alcohol   Drug use: Never     Allergies   Penicillins   Review of Systems Review of Systems  HENT:  Positive for ear pain.   Respiratory:  Positive for cough.      Physical Exam Triage Vital Signs ED Triage Vitals  Encounter Vitals Group     BP 08/10/23 1228 103/76     Girls Systolic BP Percentile --      Girls Diastolic BP Percentile --      Boys Systolic BP Percentile --      Boys Diastolic BP Percentile --      Pulse Rate 08/10/23 1228 65     Resp 08/10/23 1228 20     Temp 08/10/23 1228 98.6 F (37 C)     Temp Source 08/10/23 1228 Oral     SpO2 08/10/23 1228 96 %     Weight --      Height --      Head Circumference --      Peak Flow --      Pain Score 08/10/23 1231 2     Pain Loc --      Pain Education --      Exclude from Growth Chart --    No data found.  Updated Vital Signs BP 103/76 (BP Location: Right Arm)   Pulse 65   Temp 98.6 F (37 C) (Oral)   Resp 20   LMP 07/21/2023   SpO2 96%   Visual Acuity Right Eye Distance:   Left Eye Distance:   Bilateral Distance:    Right Eye Near:   Left Eye Near:    Bilateral Near:     Physical Exam Constitutional:      General: She is not in acute distress.    Appearance: Normal appearance. She is not ill-appearing, toxic-appearing or diaphoretic.  HENT:     Head: Normocephalic and atraumatic.     Right Ear: Tympanic membrane and ear canal normal.     Left Ear: Tympanic membrane and ear canal normal.     Nose: Congestion present.     Mouth/Throat:     Pharynx: Oropharynx is clear.  Eyes:     Conjunctiva/sclera: Conjunctivae normal.  Cardiovascular:     Rate and Rhythm: Normal rate and regular rhythm.     Pulses: Normal pulses.     Heart sounds: Normal heart sounds.  Pulmonary:      Effort: Pulmonary effort is normal.  Breath sounds: Normal breath sounds.  Skin:    General: Skin is warm and dry.  Neurological:     Mental Status: She is alert.  Psychiatric:        Mood and Affect: Mood normal.      UC Treatments / Results  Labs (all labs ordered are listed, but only abnormal results are displayed) Labs Reviewed - No data to display  EKG   Radiology No results found.  Procedures Procedures (including critical care time)  Medications Ordered in UC Medications - No data to display  Initial Impression / Assessment and Plan / UC Course  I have reviewed the triage vital signs and the nursing notes.  Pertinent labs & imaging results that were available during my care of the patient were reviewed by me and considered in my medical decision making (see chart for details).     Sinusitis- treating with Omnicef . She can continue OTC meds as needed.  Follow up as needed.  Final Clinical Impressions(s) / UC Diagnoses   Final diagnoses:  Acute non-recurrent maxillary sinusitis     Discharge Instructions      Treating you for a sinus infection.  Take the antibiotics as prescribed. Over-the-counter Mucinex  for congestion and cough Follow-up as needed    ED Prescriptions     Medication Sig Dispense Auth. Provider   cefdinir  (OMNICEF ) 300 MG capsule Take 1 capsule (300 mg total) by mouth 2 (two) times daily for 7 days. 14 capsule Shalayna Ornstein A, FNP   fluconazole  (DIFLUCAN ) 150 MG tablet Take 1 tablet (150 mg total) by mouth daily for 2 days. Take one tab today and one in 3 days is symptoms remain 2 tablet Adah Corning A, FNP      PDMP not reviewed this encounter.   Adah Corning LABOR, FNP 08/13/23 [phone]

## 2024-01-08 ENCOUNTER — Other Ambulatory Visit: Payer: Self-pay | Admitting: Obstetrics and Gynecology

## 2024-01-08 DIAGNOSIS — N6314 Unspecified lump in the right breast, lower inner quadrant: Secondary | ICD-10-CM

## 2024-02-06 ENCOUNTER — Ambulatory Visit
Admission: RE | Admit: 2024-02-06 | Discharge: 2024-02-06 | Disposition: A | Discharge status: Home / Self Care | Source: Ambulatory Visit | Attending: Obstetrics and Gynecology | Admitting: Obstetrics and Gynecology

## 2024-02-06 ENCOUNTER — Other Ambulatory Visit: Payer: Self-pay | Admitting: Medical Genetics

## 2024-02-06 DIAGNOSIS — N6314 Unspecified lump in the right breast, lower inner quadrant: Secondary | ICD-10-CM
